# Patient Record
Sex: Male | Born: 1960
Health system: Southern US, Community
[De-identification: ages and names within clinical notes are randomized; demographics above are authoritative.]

## PROBLEM LIST (undated history)

## (undated) ENCOUNTER — Emergency Department (HOSPITAL_COMMUNITY): Payer: No Typology Code available for payment source | Source: Home / Self Care

## (undated) DIAGNOSIS — I2119 ST elevation (STEMI) myocardial infarction involving other coronary artery of inferior wall: Secondary | ICD-10-CM

## (undated) DIAGNOSIS — J449 Chronic obstructive pulmonary disease, unspecified: Secondary | ICD-10-CM

## (undated) DIAGNOSIS — J439 Emphysema, unspecified: Secondary | ICD-10-CM

## (undated) DIAGNOSIS — Z9861 Coronary angioplasty status: Secondary | ICD-10-CM

## (undated) DIAGNOSIS — I2510 Atherosclerotic heart disease of native coronary artery without angina pectoris: Secondary | ICD-10-CM

## (undated) HISTORY — PX: INGUINAL HERNIA REPAIR: SHX194

## (undated) HISTORY — DX: Chronic obstructive pulmonary disease, unspecified: J44.9

## (undated) HISTORY — PX: PERCUTANEOUS CORONARY STENT INTERVENTION (PCI-S): SHX6016

## (undated) HISTORY — DX: Emphysema, unspecified: J43.9

---

## 1985-09-02 HISTORY — PX: OTHER SURGICAL HISTORY: SHX169

## 2005-05-06 ENCOUNTER — Emergency Department (HOSPITAL_COMMUNITY): Admission: EM | Admit: 2005-05-06 | Discharge: 2005-05-06 | Payer: Self-pay | Admitting: Family Medicine

## 2005-05-08 ENCOUNTER — Emergency Department (HOSPITAL_COMMUNITY): Admission: EM | Admit: 2005-05-08 | Discharge: 2005-05-08 | Payer: Self-pay | Admitting: Family Medicine

## 2005-08-30 ENCOUNTER — Emergency Department (HOSPITAL_COMMUNITY): Admission: EM | Admit: 2005-08-30 | Discharge: 2005-08-30 | Payer: Self-pay | Admitting: Family Medicine

## 2005-10-23 ENCOUNTER — Ambulatory Visit: Payer: Self-pay | Admitting: Internal Medicine

## 2006-03-03 ENCOUNTER — Encounter: Admission: RE | Admit: 2006-03-03 | Discharge: 2006-03-03 | Payer: Self-pay | Admitting: Orthopaedic Surgery

## 2007-07-15 ENCOUNTER — Telehealth (INDEPENDENT_AMBULATORY_CARE_PROVIDER_SITE_OTHER): Payer: Self-pay | Admitting: *Deleted

## 2007-07-16 ENCOUNTER — Ambulatory Visit: Payer: Self-pay | Admitting: Internal Medicine

## 2007-07-17 LAB — CONVERTED CEMR LAB
Albumin: 4.5 g/dL (ref 3.5–5.2)
BUN: 3 mg/dL — ABNORMAL LOW (ref 6–23)
CO2: 32 meq/L (ref 19–32)
Calcium: 9.6 mg/dL (ref 8.4–10.5)
Chloride: 99 meq/L (ref 96–112)
Creatinine, Ser: 0.8 mg/dL (ref 0.4–1.5)
GFR calc Af Amer: 134 mL/min
GFR calc non Af Amer: 111 mL/min
Glucose, Bld: 108 mg/dL — ABNORMAL HIGH (ref 70–99)
Phosphorus: 3.4 mg/dL (ref 2.3–4.6)
Potassium: 3.6 meq/L (ref 3.5–5.1)
Sodium: 137 meq/L (ref 135–145)
Uric Acid, Serum: 6.7 mg/dL (ref 2.4–7.0)

## 2007-09-03 HISTORY — PX: OTHER SURGICAL HISTORY: SHX169

## 2012-06-22 ENCOUNTER — Encounter: Payer: Self-pay | Admitting: Gastroenterology

## 2012-06-22 ENCOUNTER — Ambulatory Visit (INDEPENDENT_AMBULATORY_CARE_PROVIDER_SITE_OTHER)
Admission: RE | Admit: 2012-06-22 | Discharge: 2012-06-22 | Disposition: A | Discharge status: Home / Self Care | Payer: 59 | Source: Ambulatory Visit | Attending: Internal Medicine | Admitting: Internal Medicine

## 2012-06-22 ENCOUNTER — Encounter: Payer: Self-pay | Admitting: Internal Medicine

## 2012-06-22 ENCOUNTER — Other Ambulatory Visit: Payer: Self-pay

## 2012-06-22 ENCOUNTER — Ambulatory Visit (INDEPENDENT_AMBULATORY_CARE_PROVIDER_SITE_OTHER): Payer: 59 | Admitting: Internal Medicine

## 2012-06-22 VITALS — BP 138/80 | HR 74 | Temp 98.0°F | Ht 69.0 in | Wt 143.0 lb

## 2012-06-22 DIAGNOSIS — R059 Cough, unspecified: Secondary | ICD-10-CM | POA: Insufficient documentation

## 2012-06-22 DIAGNOSIS — J449 Chronic obstructive pulmonary disease, unspecified: Secondary | ICD-10-CM

## 2012-06-22 DIAGNOSIS — R05 Cough: Secondary | ICD-10-CM

## 2012-06-22 DIAGNOSIS — Z0000 Encounter for general adult medical examination without abnormal findings: Secondary | ICD-10-CM | POA: Insufficient documentation

## 2012-06-22 DIAGNOSIS — Z1211 Encounter for screening for malignant neoplasm of colon: Secondary | ICD-10-CM

## 2012-06-22 NOTE — Progress Notes (Signed)
Subjective:    Patient ID: Ryan Cantrell, male    DOB: Jun 30, 1961, 52 y.o.   MRN: [id number]  HPI Here for 1st time in about 5 years Had urgent care visit for chest tightness at night---given albuterol inhaler and he takes that at night Has decreased to 1/3rd pack per day plus electronic cigarette sometimes  Reviewed status Did break left shoulder 2009--doing okay from this  Some sleep problems with the breathing ---diagnosed also at urgent care Wife has noted occ gasping in past--seems to be better with the albuterol Not much cough in day No exercise of note-- no real change in activity tolerance  Current Outpatient Prescriptions on File Prior to Visit  Medication Sig Dispense Refill  . PROAIR HFA 108 (90 BASE) MCG/ACT inhaler Inhale 1-2 puffs into the lungs as needed.         Allergies  Allergen Reactions  . Codeine Sulfate     No past medical history on file.  Past Surgical History  Procedure Date  . Inguinal hernia repair     twice on right and once on left-- 1980's-90's  . Right shoulder surgery 1987  . Fracture left humerus 2009    no surgery. Torn rotator cuff also    Family History  Problem Relation Age of Onset  . Hypertension Mother   . Diabetes Mother   . Heart disease Mother     stent  . Cancer Father     prostate  . Hypertension Father   . Gout Father   . Heart disease Father     Stent    History   Social History  . Marital Status: Married    Spouse Name: N/A    Number of Children: 1  . Years of Education: N/A   Occupational History  . Control and instrumentation engineer    Social History Main Topics  . Smoking status: Current Every Day Smoker    Types: Cigarettes  . Smokeless tobacco: Never Used  . Alcohol Use: No  . Drug Use: No  . Sexually Active: Not on file   Other Topics Concern  . Not on file   Social History Narrative  . No narrative on file   Review of Systems  Constitutional: Negative for fatigue and unexpected weight change.   Weight down mildly from 5 years ago  HENT: Positive for hearing loss and dental problem. Negative for congestion, rhinorrhea and tinnitus.        Teeth are "awful" Thinks he will need to have them all pulled  Eyes: Negative for visual disturbance.       No diplopia or unilateral vision loss More trouble with night vision and fine work  Respiratory: Positive for cough and chest tightness. Negative for shortness of breath.   Cardiovascular: Negative for chest pain, palpitations and leg swelling.  Gastrointestinal: Negative for nausea, vomiting, abdominal pain, constipation and blood in stool.       No heartburn   Genitourinary: Positive for urgency. Negative for frequency and difficulty urinating.       No sexual problems  Musculoskeletal: Negative for back pain and joint swelling.       Left thumb pain  Skin: Positive for rash.       Some eczema --mostly on feet Uses OTC cream  Neurological: Positive for headaches. Negative for dizziness, syncope, weakness, light-headedness and numbness.       AM headaches---?wondering about sleep apnea Drinks a lot of caffeinated Dr Lane Hacker this  Hematological: Positive for adenopathy. Does  not bruise/bleed easily.       Did have some glands in his neck---not too noticeable now  Psychiatric/Behavioral: Positive for disturbed wake/sleep cycle. Negative for dysphoric mood. The patient is not nervous/anxious.        Irritable with cutting down on cigarettes       Objective:   Physical Exam  Constitutional: He is oriented to person, place, and time. He appears well-developed and well-nourished. No distress.  HENT:  Head: Normocephalic and atraumatic.  Right Ear: External ear normal.  Left Ear: External ear normal.  Mouth/Throat: Oropharynx is clear and moist. No oropharyngeal exudate.       Poor dentition  Eyes: Conjunctivae normal and EOM are normal. Pupils are equal, round, and reactive to light.  Neck: Normal range of motion. Neck  supple. No thyromegaly present.  Cardiovascular: Normal rate, regular rhythm, normal heart sounds and intact distal pulses.  Exam reveals no gallop.   No murmur heard. Pulmonary/Chest: Effort normal. No respiratory distress. He has no wheezes. He has no rales.       Mildly decreased breath sounds at bases  Abdominal: Soft. There is no tenderness.  Musculoskeletal: He exhibits no edema and no tenderness.  Lymphadenopathy:    He has no cervical adenopathy.  Neurological: He is alert and oriented to person, place, and time.  Skin: Rash noted. No erythema.       Scaling of apparent dyshydrotic eczema on feet  Psychiatric: He has a normal mood and affect. His behavior is normal.          Assessment & Plan:

## 2012-06-22 NOTE — Assessment & Plan Note (Signed)
He is mildly symptomatic with his night cough and breathing issues Albuterol has helped Must stop smoking--discussed this and instructions given

## 2012-06-22 NOTE — Patient Instructions (Signed)
Please start nicotine patch 21mg  daily and use for at least 2-3 months. You can also use nicotine gum or lozenges when you feel a craving so that you never smoke a cigarette again

## 2012-06-22 NOTE — Addendum Note (Signed)
Addended by: Alvina Chou on: 06/22/2012 03:25 PM   Modules accepted: Orders

## 2012-06-22 NOTE — Assessment & Plan Note (Signed)
Reasonably healthy No exercise--discussed Will check PSA since dad had prostate cancer Will set up colonoscopy

## 2012-06-23 LAB — HEPATIC FUNCTION PANEL
Albumin: 4.2 g/dL (ref 3.5–5.2)
Bilirubin, Direct: 0.2 mg/dL (ref 0.0–0.3)
Total Protein: 7.4 g/dL (ref 6.0–8.3)

## 2012-06-23 LAB — BASIC METABOLIC PANEL
BUN: 8 mg/dL (ref 6–23)
CO2: 30 mEq/L (ref 19–32)
Calcium: 9.4 mg/dL (ref 8.4–10.5)
Creatinine, Ser: 0.8 mg/dL (ref 0.4–1.5)
Glucose, Bld: 81 mg/dL (ref 70–99)
Sodium: 132 mEq/L — ABNORMAL LOW (ref 135–145)

## 2012-06-23 LAB — CBC WITH DIFFERENTIAL/PLATELET
Basophils Absolute: 0 10*3/uL (ref 0.0–0.1)
Eosinophils Absolute: 0.4 10*3/uL (ref 0.0–0.7)
Hemoglobin: 13.8 g/dL (ref 13.0–17.0)
Lymphocytes Relative: 18.6 % (ref 12.0–46.0)
MCHC: 33.1 g/dL (ref 30.0–36.0)
Neutro Abs: 5 10*3/uL (ref 1.4–7.7)
Platelets: 347 10*3/uL (ref 150.0–400.0)
RDW: 13.3 % (ref 11.5–14.6)

## 2012-06-23 LAB — LIPID PANEL
Cholesterol: 136 mg/dL (ref 0–200)
HDL: 30.7 mg/dL — ABNORMAL LOW (ref >39.00)
Triglycerides: 135 mg/dL (ref 0.0–149.0)
VLDL: 27 mg/dL (ref 0.0–40.0)

## 2012-06-25 LAB — TSH: TSH: 0.72 u[IU]/mL (ref 0.35–5.50)

## 2012-06-29 ENCOUNTER — Encounter: Payer: Self-pay | Admitting: *Deleted

## 2012-07-02 ENCOUNTER — Telehealth: Payer: Self-pay

## 2012-07-02 NOTE — Telephone Encounter (Signed)
Pt returned call about lab and xray results(cannot note on result notes) CMA has already mailed letter with lab results and had spoken with pts wife for xray results. Reviewed lab and xray results with pt as instructed.pt voiced understanding.

## 2012-08-03 ENCOUNTER — Ambulatory Visit (AMBULATORY_SURGERY_CENTER): Payer: 59 | Admitting: *Deleted

## 2012-08-03 VITALS — Ht 71.0 in | Wt 155.4 lb

## 2012-08-03 DIAGNOSIS — Z1211 Encounter for screening for malignant neoplasm of colon: Secondary | ICD-10-CM

## 2012-08-03 MED ORDER — MOVIPREP 100 G PO SOLR: ORAL | Status: DC

## 2012-08-17 ENCOUNTER — Ambulatory Visit (AMBULATORY_SURGERY_CENTER): Payer: 59 | Admitting: Gastroenterology

## 2012-08-17 ENCOUNTER — Encounter: Payer: Self-pay | Admitting: Gastroenterology

## 2012-08-17 VITALS — BP 116/63 | HR 71 | Resp 14 | Ht 71.0 in | Wt 155.0 lb

## 2012-08-17 DIAGNOSIS — K5730 Diverticulosis of large intestine without perforation or abscess without bleeding: Secondary | ICD-10-CM

## 2012-08-17 DIAGNOSIS — Z1211 Encounter for screening for malignant neoplasm of colon: Secondary | ICD-10-CM

## 2012-08-17 DIAGNOSIS — K644 Residual hemorrhoidal skin tags: Secondary | ICD-10-CM

## 2012-08-17 MED ORDER — SODIUM CHLORIDE 0.9 % IV SOLN: 500.0000 mL | INTRAVENOUS | Status: DC

## 2012-08-17 NOTE — Op Note (Signed)
[facility] [address] [place], [address]   COLONOSCOPY PROCEDURE REPORT  PATIENT: Ryan Cantrell, Ryan Cantrell  MR#: [id number] BIRTHDATE: 01/07/1961 , 51  yrs. old GENDER: Male ENDOSCOPIST: Rachael Fee, MD REFERRED NU:[id number] Alphonsus Sias, M.D. PROCEDURE DATE:  08/17/2012 PROCEDURE:   Colonoscopy, diagnostic ASA CLASS:   Class II INDICATIONS:average risk screening. MEDICATIONS: Fentanyl 50 mcg IV, Versed 6 mg IV, and These medications were titrated to patient response per physician's verbal order  DESCRIPTION OF PROCEDURE:   After the risks benefits and alternatives of the procedure were thoroughly explained, informed consent was obtained.  A digital rectal exam revealed no abnormalities of the rectum.   The LB CF-[id number]  endoscope was introduced through the anus and advanced to the cecum, which was identified by both the appendix and ileocecal valve. No adverse events experienced.   The quality of the prep was good, using MoviPrep  The instrument was then slowly withdrawn as the colon was fully examined.  COLON FINDINGS: There were a few small diverticulum in left colon. There were medium sized external hemorrhoids.  The examination was otherwise normal.  Retroflexed views revealed no abnormalities. The time to cecum=3 minutes 15 seconds.  Withdrawal time=8 minutes 40 seconds.  The scope was withdrawn and the procedure completed. COMPLICATIONS: There were no complications.  ENDOSCOPIC IMPRESSION: There were a few small diverticulum in left colon. There were medium sized external hemorrhoids. The examination was otherwise normal. No polyps or cancers.  RECOMMENDATIONS: You should continue to follow colorectal cancer screening guidelines for "routine risk" patients with a repeat colonoscopy in 10 years. There is no need for FOBT (stool) testing for at least 5 years.   eSigned:  Rachael Fee, MD 08/17/2012 10:38 AM

## 2012-08-17 NOTE — Progress Notes (Signed)
Patient did not experience any of the following events: a burn prior to discharge; a fall within the facility; wrong site/side/patient/procedure/implant event; or a hospital transfer or hospital admission upon discharge from the facility. (G8907) Patient did not have preoperative order for IV antibiotic SSI prophylaxis. (G8918)  

## 2012-08-17 NOTE — Patient Instructions (Addendum)
YOU HAD AN ENDOSCOPIC PROCEDURE TODAY AT THE [facility]: Refer to the procedure report that was given to you for any specific questions about what was found during the examination.  If the procedure report does not answer your questions, please call your gastroenterologist to clarify.  If you requested that your care partner not be given the details of your procedure findings, then the procedure report has been included in a sealed envelope for you to review at your convenience later.  YOU SHOULD EXPECT: Some feelings of bloating in the abdomen. Passage of more gas than usual.  Walking can help get rid of the air that was put into your GI tract during the procedure and reduce the bloating. If you had a lower endoscopy (such as a colonoscopy or flexible sigmoidoscopy) you may notice spotting of blood in your stool or on the toilet paper. If you underwent a bowel prep for your procedure, then you may not have a normal bowel movement for a few days.  DIET: Your first meal following the procedure should be a light meal and then it is ok to progress to your normal diet.  A half-sandwich or bowl of soup is an example of a good first meal.  Heavy or fried foods are harder to digest and may make you feel nauseous or bloated.  Likewise meals heavy in dairy and vegetables can cause extra gas to form and this can also increase the bloating.  Drink plenty of fluids but you should avoid alcoholic beverages for 24 hours.  ACTIVITY: Your care partner should take you home directly after the procedure.  You should plan to take it easy, moving slowly for the rest of the day.  You can resume normal activity the day after the procedure however you should NOT DRIVE or use heavy machinery for 24 hours (because of the sedation medicines used during the test).    SYMPTOMS TO REPORT IMMEDIATELY: A gastroenterologist can be reached at any hour.  During normal business hours, 8:30 AM to 5:00 PM Monday through Friday,  call [phone].  After hours and on weekends, please call the GI answering service at [phone] who will take a message and have the physician on call contact you.   Following lower endoscopy (colonoscopy or flexible sigmoidoscopy):  Excessive amounts of blood in the stool  Significant tenderness or worsening of abdominal pains  Swelling of the abdomen that is new, acute  Fever of 100F or higher    FOLLOW UP: If any biopsies were taken you will be contacted by phone or by letter within the next 1-3 weeks.  Call your gastroenterologist if you have not heard about the biopsies in 3 weeks.  Our staff will call the home number listed on your records the next business day following your procedure to check on you and address any questions or concerns that you may have at that time regarding the information given to you following your procedure. This is a courtesy call and so if there is no answer at the home number and we have not heard from you through the emergency physician on call, we will assume that you have returned to your regular daily activities without incident.  SIGNATURES/CONFIDENTIALITY: You and/or your care partner have signed paperwork which will be entered into your electronic medical record.  These signatures attest to the fact that that the information above on your After Visit Summary has been reviewed and is understood.  Full responsibility of the confidentiality   of this discharge information lies with you and/or your care-partner.     

## 2012-08-18 ENCOUNTER — Telehealth: Payer: Self-pay | Admitting: *Deleted

## 2012-08-18 NOTE — Telephone Encounter (Signed)
  Follow up Call-  Call back number 08/17/2012  Post procedure Call Back phone  # [phone]  Permission to leave phone message Yes    [facility]

## 2012-08-31 ENCOUNTER — Encounter: Payer: Self-pay | Admitting: Internal Medicine

## 2012-09-03 ENCOUNTER — Encounter: Payer: Self-pay | Admitting: Internal Medicine

## 2012-09-07 ENCOUNTER — Telehealth: Payer: Self-pay | Admitting: Internal Medicine

## 2012-09-07 MED ORDER — ALBUTEROL SULFATE HFA 108 (90 BASE) MCG/ACT IN AERS: 2.0000 | INHALATION_SPRAY | Freq: Three times a day (TID) | RESPIRATORY_TRACT | Status: DC | PRN

## 2012-09-07 NOTE — Telephone Encounter (Signed)
See My chart message

## 2012-09-22 ENCOUNTER — Ambulatory Visit: Payer: 59 | Admitting: Internal Medicine

## 2012-09-22 ENCOUNTER — Encounter: Payer: Self-pay | Admitting: Family Medicine

## 2012-09-22 ENCOUNTER — Ambulatory Visit (INDEPENDENT_AMBULATORY_CARE_PROVIDER_SITE_OTHER): Payer: 59 | Admitting: Family Medicine

## 2012-09-22 VITALS — BP 122/68 | HR 66 | Temp 98.2°F | Ht 71.0 in | Wt 151.8 lb

## 2012-09-22 DIAGNOSIS — R51 Headache: Secondary | ICD-10-CM

## 2012-09-22 DIAGNOSIS — R519 Headache, unspecified: Secondary | ICD-10-CM | POA: Insufficient documentation

## 2012-09-22 MED ORDER — CYCLOBENZAPRINE HCL 10 MG PO TABS: 10.0000 mg | ORAL_TABLET | Freq: Two times a day (BID) | ORAL | Status: DC | PRN

## 2012-09-22 NOTE — Progress Notes (Signed)
  Subjective:    Patient ID: Ryan Cantrell, male    DOB: 1961/05/25, 52 y.o.   MRN: [id number]  HPI CC: headache  Tends to get headaches in morning (3/10 pain), attributed to caffeine.  This morning had "vise-like" bilateral headache - described as tight pain both temples - lasted about 1 hour.  Did not wake him up from sleep.  Rates at 7/10 pain.  Improved with tylenol.  Mild residual headache.  Stayed home from work 2/2 headache.  No photo/phonophobia, nausea, vision changes, dizziness, vertigo.  Comes in for evaluation of this new headache this morning.  + neck pain, attributes to work.  Works at [employer], lots of repetitive head motion back and forth.  More stress at work recently - big projects.  Caffeine - four to five 12 oz dr pepper per day.   No changes to this. Sleeps 7 hours/night.  Sleeps same amt on weekends. Smoking - about 5-8 cig/day.  Using patches and e-cig.  No h/o allergic rhinitis. Uses reading glasses at work.  Notes some trouble with fine print.  Last saw eye doctor several years ago.  States he usually takes 1000-2000mg  tylenol daily for headaches.  Feeling mildly more tired recently.  Past Medical History  Diagnosis Date  . COPD (chronic obstructive pulmonary disease)   . Emphysema     Review of Systems Per HPI    Objective:   Physical Exam  Nursing note and vitals reviewed. Constitutional: He appears well-developed and well-nourished. No distress.  HENT:  Head: Normocephalic and atraumatic.  Right Ear: Hearing, tympanic membrane, external ear and ear canal normal.  Left Ear: Hearing, tympanic membrane, external ear and ear canal normal.  Nose: Nose normal. No mucosal edema or rhinorrhea. Right sinus exhibits no maxillary sinus tenderness and no frontal sinus tenderness. Left sinus exhibits no maxillary sinus tenderness and no frontal sinus tenderness.  Mouth/Throat: Uvula is midline, oropharynx is clear and moist and mucous membranes are normal. No  oropharyngeal exudate, posterior oropharyngeal edema, posterior oropharyngeal erythema or tonsillar abscesses.       No nasal congestion Poor dentition  Eyes: Conjunctivae normal and EOM are normal. Pupils are equal, round, and reactive to light. No scleral icterus.  Neck: Normal range of motion. Neck supple.       FROM at neck. No pain to palpation midline cervical spine.  Cardiovascular: Normal rate, regular rhythm, normal heart sounds and intact distal pulses.   No murmur heard. Pulmonary/Chest: Effort normal and breath sounds normal. No respiratory distress. He has no wheezes. He has no rales.  Lymphadenopathy:    He has no cervical adenopathy.  Neurological: He is alert. He has normal strength. No cranial nerve deficit or sensory deficit. He displays a negative Romberg sign. Coordination and gait normal.       CN 2-12 intact Station and gait intact Neg pronator drift. Normal FTN, HTS. Some horizontal nystagmus noted with end lateral gaze bilaterally  Skin: Skin is warm and dry. No rash noted.       Assessment & Plan:

## 2012-09-22 NOTE — Assessment & Plan Note (Addendum)
In setting of significant caffeine use, likely caffeine related - recommend slowly try and titrate down amt caffeine. ?MOH - rec try to limit analgesic use, prescribed flexeril abortively. Less likely but possible TTH - provided with neck stretching exercises. Advised him to monitor HA for any triggers. Forgot to check snellen today.

## 2012-09-22 NOTE — Patient Instructions (Addendum)
I wonder if this is a bit of tension type headache from repetitive motions at work (stress can also contribute). Continue to use tylenol as needed during day. If at night or if you're at home, may try muscle relaxant to help headache. If worsening despite this, let us know. Stretching exercises provided. Work on cutting back on caffeine slowly. Try to back off of tylenol (ideally no more than 2-3 times per week).

## 2012-09-24 ENCOUNTER — Encounter: Payer: Self-pay | Admitting: Internal Medicine

## 2012-09-24 ENCOUNTER — Ambulatory Visit: Payer: 59 | Admitting: Internal Medicine

## 2012-09-24 ENCOUNTER — Ambulatory Visit (INDEPENDENT_AMBULATORY_CARE_PROVIDER_SITE_OTHER): Payer: 59 | Admitting: Internal Medicine

## 2012-09-24 VITALS — BP 138/70 | HR 75 | Temp 98.0°F | Wt 150.0 lb

## 2012-09-24 DIAGNOSIS — J449 Chronic obstructive pulmonary disease, unspecified: Secondary | ICD-10-CM

## 2012-09-24 DIAGNOSIS — F17200 Nicotine dependence, unspecified, uncomplicated: Secondary | ICD-10-CM

## 2012-09-24 DIAGNOSIS — F17210 Nicotine dependence, cigarettes, uncomplicated: Secondary | ICD-10-CM

## 2012-09-24 DIAGNOSIS — Z87891 Personal history of nicotine dependence: Secondary | ICD-10-CM | POA: Insufficient documentation

## 2012-09-24 NOTE — Patient Instructions (Signed)
Please stop the smoking entirely. Use a nicotine lozenge if you feel the urge to smoke Call the 1-800 -QUIT NOW hotline

## 2012-09-24 NOTE — Progress Notes (Signed)
  Subjective:    Patient ID: Ryan Cantrell, male    DOB: 1961/07/18, 52 y.o.   MRN: [id number]  HPI Wife is here Headache is better He believes there is some degree of neck strain Discussed cutting back on the caffeine also  Breathing is better No longer awakening at night No longer gasping Still uses the albuterol every night at bedtime No wheezing or tightness Snoring is better with decreased cigarettes  Still smoking but has cut back Now about 6 per day Hard around the holidays when he is home more Recognizes that oral fixation is a big issue as well Did try the patches for a while but never gave up the cigarettes   Current Outpatient Prescriptions on File Prior to Visit  Medication Sig Dispense Refill  . Acetaminophen (TYLENOL PO) Take by mouth as needed.      Marland Kitchen albuterol (PROAIR HFA) 108 (90 BASE) MCG/ACT inhaler Inhale 2 puffs into the lungs 3 (three) times daily as needed.  18 g  1  . cyclobenzaprine (FLEXERIL) 10 MG tablet Take 1 tablet (10 mg total) by mouth 2 (two) times daily as needed (headache (sedation precautions)).  30 tablet  0  . Multiple Vitamin (MULTIVITAMIN) tablet Take 1 tablet by mouth daily.        Allergies  Allergen Reactions  . Codeine Sulfate Nausea And Vomiting    Past Medical History  Diagnosis Date  . COPD (chronic obstructive pulmonary disease)   . Emphysema     Past Surgical History  Procedure Date  . Inguinal hernia repair     twice on right and once on left-- 1980's-90's  . Right shoulder surgery 1987  . Fracture left humerus 2009    no surgery. Torn rotator cuff also    Family History  Problem Relation Age of Onset  . Hypertension Mother   . Diabetes Mother   . Heart disease Mother     stent  . Cancer Father     prostate  . Hypertension Father   . Gout Father   . Heart disease Father     Stent  . Colon cancer Neg Hx   . Stomach cancer Neg Hx     History   Social History  . Marital Status: Married    Spouse Name:  N/A    Number of Children: 1  . Years of Education: N/A   Occupational History  . Control and instrumentation engineer    Social History Main Topics  . Smoking status: Light Tobacco Smoker    Types: Cigarettes  . Smokeless tobacco: Never Used     Comment: smokes 3 cigarettes daily  . Alcohol Use: No  . Drug Use: No  . Sexually Active: Not on file   Other Topics Concern  . Not on file   Social History Narrative  . No narrative on file   Review of Systems Not using spacer--discussed Eating okay--better with decreased cigarettes     Objective:   Physical Exam  Constitutional: He appears well-developed and well-nourished. No distress.  Pulmonary/Chest: Effort normal and breath sounds normal. No respiratory distress. He has no wheezes. He has no rales.          Assessment & Plan:

## 2012-09-24 NOTE — Assessment & Plan Note (Signed)
Counseled for 5-10 minutes Must stop Gave info on 1-800 QUIT NOW

## 2012-09-24 NOTE — Assessment & Plan Note (Signed)
Better with decreased smoking Uses the inhaler just at night

## 2012-12-01 ENCOUNTER — Encounter: Payer: Self-pay | Admitting: *Deleted

## 2012-12-01 ENCOUNTER — Ambulatory Visit (INDEPENDENT_AMBULATORY_CARE_PROVIDER_SITE_OTHER): Payer: 59 | Admitting: Family Medicine

## 2012-12-01 ENCOUNTER — Encounter: Payer: Self-pay | Admitting: Family Medicine

## 2012-12-01 VITALS — BP 126/82 | HR 71 | Temp 98.2°F | Wt 151.2 lb

## 2012-12-01 DIAGNOSIS — R059 Cough, unspecified: Secondary | ICD-10-CM | POA: Insufficient documentation

## 2012-12-01 DIAGNOSIS — R05 Cough: Secondary | ICD-10-CM

## 2012-12-01 MED ORDER — BENZONATATE 200 MG PO CAPS: 200.0000 mg | ORAL_CAPSULE | Freq: Three times a day (TID) | ORAL | Status: DC | PRN

## 2012-12-01 MED ORDER — DOXYCYCLINE HYCLATE 100 MG PO TABS: 100.0000 mg | ORAL_TABLET | Freq: Two times a day (BID) | ORAL | Status: DC

## 2012-12-01 NOTE — Progress Notes (Signed)
Sx started about 8 days ago.  Cough and change in taste.  +sputum, worse at night.  Used his SABA w/o much obvious relief.  He has a lot of postnasal gtt.  No wheeze per patient.  Not sob.  Chest is sore from cough. No ear pain now but felt a little stuffy prev. HA noted. No fevers known.  Mild occ ST likely from cough.  D/w pt about smoking, he's considering quitting.   Had a flu shot at work.   Meds, vitals, and allergies reviewed.   ROS: See HPI.  Otherwise, noncontributory.  GEN: nad, alert and oriented HEENT: mucous membranes moist, tm w/o erythema, nasal exam w/o erythema, clear discharge noted,  OP with cobblestoning NECK: supple w/o LA CV: rrr.   PULM: ctab, no inc wob, no wheeze EXT: no edema SKIN: no acute rash

## 2012-12-01 NOTE — Assessment & Plan Note (Signed)
Would treat with doxy given his hx and his recent sx >1 week.  Nontoxic.  Use tessalon for cough.  D/w pt about smoking.  F/u prn.  He agrees.

## 2012-12-01 NOTE — Patient Instructions (Signed)
Take the tessalon for cough and start the antibiotics today.   This should gradually improve.  Take care.  Glad to see you.

## 2013-01-25 ENCOUNTER — Other Ambulatory Visit: Payer: Self-pay | Admitting: Internal Medicine

## 2013-04-05 ENCOUNTER — Other Ambulatory Visit: Payer: Self-pay | Admitting: Internal Medicine

## 2013-04-17 ENCOUNTER — Other Ambulatory Visit: Payer: Self-pay | Admitting: Internal Medicine

## 2013-06-04 ENCOUNTER — Ambulatory Visit (INDEPENDENT_AMBULATORY_CARE_PROVIDER_SITE_OTHER): Payer: No Typology Code available for payment source | Admitting: Internal Medicine

## 2013-06-04 ENCOUNTER — Encounter: Payer: Self-pay | Admitting: Internal Medicine

## 2013-06-04 VITALS — BP 140/80 | HR 63 | Temp 97.9°F | Wt 141.0 lb

## 2013-06-04 DIAGNOSIS — F17210 Nicotine dependence, cigarettes, uncomplicated: Secondary | ICD-10-CM

## 2013-06-04 DIAGNOSIS — J209 Acute bronchitis, unspecified: Secondary | ICD-10-CM

## 2013-06-04 DIAGNOSIS — J449 Chronic obstructive pulmonary disease, unspecified: Secondary | ICD-10-CM

## 2013-06-04 DIAGNOSIS — F17200 Nicotine dependence, unspecified, uncomplicated: Secondary | ICD-10-CM

## 2013-06-04 MED ORDER — VARENICLINE TARTRATE 0.5 MG X 11 & 1 MG X 42 PO MISC: ORAL | Status: DC

## 2013-06-04 MED ORDER — VARENICLINE TARTRATE 1 MG PO TABS: 1.0000 mg | ORAL_TABLET | Freq: Two times a day (BID) | ORAL | Status: DC

## 2013-06-04 MED ORDER — DOXYCYCLINE MONOHYDRATE 100 MG PO TABS: 100.0000 mg | ORAL_TABLET | Freq: Two times a day (BID) | ORAL | Status: DC

## 2013-06-04 NOTE — Assessment & Plan Note (Signed)
Not exacerbated 

## 2013-06-04 NOTE — Patient Instructions (Addendum)
Call [phone] QUIT NOW

## 2013-06-04 NOTE — Assessment & Plan Note (Signed)
Unclear if still viral  Given lung issues--- will start antibiotic

## 2013-06-04 NOTE — Assessment & Plan Note (Signed)
Discussed chantix Will try this No history of depression, etc Discussed side effects and treatment duration

## 2013-06-04 NOTE — Progress Notes (Signed)
  Subjective:    Patient ID: Ryan Cantrell, male    DOB: 07-Apr-1961, 52 y.o.   MRN: [id number]  HPI Has been coughing---not sure if it is from drainage Worse at night--kept him from sleeping well Bush hogged 6 acres last week---seemed to start then Still smoking-but back up 3/4 PPD  No fever Mild SOB at night due to coughing Some wheezing per wife --at night Still uses inhaler at bedtime---rare extra doses  Scratchy throat No ear pain  Tried some "airborne"  Current Outpatient Prescriptions on File Prior to Visit  Medication Sig Dispense Refill  . Multiple Vitamin (MULTIVITAMIN) tablet Take 1 tablet by mouth daily.      . VENTOLIN HFA 108 (90 BASE) MCG/ACT inhaler INHALE 2 PUFFS BY MOUTH THREE TIMES DAILY AS NEEDED  18 g  0   No current facility-administered medications on file prior to visit.    Allergies  Allergen Reactions  . Codeine Sulfate Nausea And Vomiting    Past Medical History  Diagnosis Date  . COPD (chronic obstructive pulmonary disease)   . Emphysema     Past Surgical History  Procedure Laterality Date  . Inguinal hernia repair      twice on right and once on left-- 1980's-90's  . Right shoulder surgery  1987  . Fracture left humerus  2009    no surgery. Torn rotator cuff also    Family History  Problem Relation Age of Onset  . Hypertension Mother   . Diabetes Mother   . Heart disease Mother     stent  . Cancer Father     prostate  . Hypertension Father   . Gout Father   . Heart disease Father     Stent  . Colon cancer Neg Hx   . Stomach cancer Neg Hx     History   Social History  . Marital Status: Married    Spouse Name: N/A    Number of Children: 1  . Years of Education: N/A   Occupational History  . Control and instrumentation engineer    Social History Main Topics  . Smoking status: Light Tobacco Smoker    Types: Cigarettes  . Smokeless tobacco: Never Used     Comment: smokes 3 cigarettes daily  . Alcohol Use: No  . Drug Use: No  . Sexual  Activity: Not on file   Other Topics Concern  . Not on file   Social History Narrative  . No narrative on file   Review of Systems Slight rash on abdomen---resolving now. Relates to eczema No vomiting or diarrhea Appetite is okay     Objective:   Physical Exam  Constitutional: He appears well-developed and well-nourished. No distress.  HENT:  Mouth/Throat: Oropharynx is clear and moist. No oropharyngeal exudate.  No sinus tenderness TMs normal Moderate nasal inflammation  Neck: Normal range of motion. Neck supple. No thyromegaly present.  Pulmonary/Chest: Effort normal and breath sounds normal. No respiratory distress. He has no wheezes. He has no rales.  Lymphadenopathy:    He has no cervical adenopathy.          Assessment & Plan:

## 2013-06-13 ENCOUNTER — Other Ambulatory Visit: Payer: Self-pay | Admitting: Internal Medicine

## 2013-07-02 ENCOUNTER — Encounter: Payer: Self-pay | Admitting: Family Medicine

## 2013-07-02 ENCOUNTER — Ambulatory Visit (INDEPENDENT_AMBULATORY_CARE_PROVIDER_SITE_OTHER)
Admission: RE | Admit: 2013-07-02 | Discharge: 2013-07-02 | Disposition: A | Discharge status: Home / Self Care | Payer: No Typology Code available for payment source | Source: Ambulatory Visit | Attending: Family Medicine | Admitting: Family Medicine

## 2013-07-02 ENCOUNTER — Ambulatory Visit (INDEPENDENT_AMBULATORY_CARE_PROVIDER_SITE_OTHER): Payer: No Typology Code available for payment source | Admitting: Family Medicine

## 2013-07-02 ENCOUNTER — Telehealth: Payer: Self-pay | Admitting: Family Medicine

## 2013-07-02 VITALS — BP 140/70 | HR 70 | Temp 98.5°F | Ht 71.0 in | Wt 145.0 lb

## 2013-07-02 DIAGNOSIS — J449 Chronic obstructive pulmonary disease, unspecified: Secondary | ICD-10-CM

## 2013-07-02 DIAGNOSIS — R059 Cough, unspecified: Secondary | ICD-10-CM

## 2013-07-02 DIAGNOSIS — J4489 Other specified chronic obstructive pulmonary disease: Secondary | ICD-10-CM

## 2013-07-02 DIAGNOSIS — R053 Chronic cough: Secondary | ICD-10-CM | POA: Insufficient documentation

## 2013-07-02 DIAGNOSIS — R071 Chest pain on breathing: Secondary | ICD-10-CM

## 2013-07-02 DIAGNOSIS — R05 Cough: Secondary | ICD-10-CM

## 2013-07-02 DIAGNOSIS — R0789 Other chest pain: Secondary | ICD-10-CM | POA: Insufficient documentation

## 2013-07-02 MED ORDER — TIOTROPIUM BROMIDE MONOHYDRATE 18 MCG IN CAPS: 18.0000 ug | ORAL_CAPSULE | Freq: Every day | RESPIRATORY_TRACT | Status: DC

## 2013-07-02 NOTE — Patient Instructions (Signed)
We will call you with X-ray results.  

## 2013-07-02 NOTE — Progress Notes (Signed)
Subjective:    Patient ID: Ryan Cantrell, male    DOB: [birth year], 52 y.o.   MRN: [id number]  HPI 52 year old male pt of Dr. Alphonsus Sias with history of COPD presents with  pain in right chest wall.  Sharp shooting pain in right chest wall after sneezing yesterday. He had been sore in chest wall with recent nighttime coughing. Dry cough.  No fever. No chills.  No current sob, mild wheezing at night per wife. No ear pain, no face pain.  Using albuterol as needed at night.  Treated on 10/3  with antibiotic  ( doxycycline x 10) for persistent cough.  His symptoms improve but never went away... persistant nighttime cough.    Last CXR 2013.. Stable  Used airborne.   Hx smoker. PFTs 2013.. Mod severe COPD... Not on controller No unexpected weight loss, no night sweats. No family history of lung cancer, father with prostate cancer.    Review of Systems  Constitutional: Negative for fever and fatigue.  HENT: Negative for ear pain.   Eyes: Negative for pain.  Respiratory: Positive for cough. Negative for shortness of breath.   Cardiovascular: Positive for chest pain. Negative for leg swelling.  Gastrointestinal: Negative for abdominal pain.       Objective:   Physical Exam  Constitutional: Vital signs are normal. He appears well-developed and well-nourished.  Non-toxic appearance. He does not appear ill. No distress.  HENT:  Head: Normocephalic and atraumatic.  Right Ear: Hearing, tympanic membrane, external ear and ear canal normal. No tenderness. No foreign bodies. Tympanic membrane is not retracted and not bulging.  Left Ear: Hearing, tympanic membrane, external ear and ear canal normal. No tenderness. No foreign bodies. Tympanic membrane is not retracted and not bulging.  Nose: Nose normal. No mucosal edema or rhinorrhea. Right sinus exhibits no maxillary sinus tenderness and no frontal sinus tenderness. Left sinus exhibits no maxillary sinus tenderness and no frontal sinus  tenderness.  Mouth/Throat: Uvula is midline, oropharynx is clear and moist and mucous membranes are normal. Normal dentition. No dental caries. No oropharyngeal exudate or tonsillar abscesses.  Eyes: Conjunctivae, EOM and lids are normal. Pupils are equal, round, and reactive to light. Lids are everted and swept, no foreign bodies found.  Neck: Trachea normal, normal range of motion and phonation normal. Neck supple. Carotid bruit is not present. No mass and no thyromegaly present.  Cardiovascular: Normal rate, regular rhythm, S1 normal, S2 normal, normal heart sounds, intact distal pulses and normal pulses.  Exam reveals no gallop.   No murmur heard. Pulmonary/Chest: Effort normal and breath sounds normal. No respiratory distress. He has no decreased breath sounds. He has no wheezes. He has no rhonchi. He has no rales. Chest wall is not dull to percussion. He exhibits tenderness. He exhibits no mass and no bony tenderness.  No focal tenderness.... Diffuse ttp on right chest wall  Abdominal: Soft. Normal appearance and bowel sounds are normal. There is no hepatosplenomegaly. There is no tenderness. There is no rebound, no guarding and no CVA tenderness. No hernia.  Neurological: He is alert. He has normal reflexes.  Skin: Skin is warm, dry and intact. No rash noted.  Psychiatric: He has a normal mood and affect. His speech is normal and behavior is normal. Judgment normal.          Assessment & Plan:  Will eval with CXR to eval for rib fracture.. ? other cause of persistent cough and chest wall pain such as lung cancer  or smoldering infection. If CXR clear... persistant cough may be a funtion of heis mod severe COPD. Recommended smoking cessation and we may need to start spiriva or advair for symptoms.

## 2013-07-02 NOTE — Telephone Encounter (Signed)
Patient advised.  He will talk with his wife about starting Spiriva.  I told him it was already at the pharmacy if he decided to start it and that Dr. Ermalene Searing felt that it would help the cough and the SOB.

## 2013-07-02 NOTE — Telephone Encounter (Signed)
Notify pt there is no evidence of infection, lung mass etc. No clear sign of rib fracture. Likely chest wall muscle strain.  Cough persitant from poorly controlled COPD.. If he is agreeable i would like to start spiriva. I  willsnd in prescription. This should help with cough and SOB.

## 2013-07-08 ENCOUNTER — Other Ambulatory Visit: Payer: Self-pay

## 2013-09-24 ENCOUNTER — Encounter: Payer: Self-pay | Admitting: Internal Medicine

## 2013-09-24 ENCOUNTER — Ambulatory Visit (INDEPENDENT_AMBULATORY_CARE_PROVIDER_SITE_OTHER): Payer: No Typology Code available for payment source | Admitting: Internal Medicine

## 2013-09-24 ENCOUNTER — Encounter: Payer: 59 | Admitting: Internal Medicine

## 2013-09-24 VITALS — BP 128/70 | HR 71 | Temp 98.6°F | Ht 71.0 in | Wt 145.0 lb

## 2013-09-24 DIAGNOSIS — Z23 Encounter for immunization: Secondary | ICD-10-CM

## 2013-09-24 DIAGNOSIS — F17200 Nicotine dependence, unspecified, uncomplicated: Secondary | ICD-10-CM

## 2013-09-24 DIAGNOSIS — Z0000 Encounter for general adult medical examination without abnormal findings: Secondary | ICD-10-CM

## 2013-09-24 DIAGNOSIS — F17210 Nicotine dependence, cigarettes, uncomplicated: Secondary | ICD-10-CM

## 2013-09-24 DIAGNOSIS — J449 Chronic obstructive pulmonary disease, unspecified: Secondary | ICD-10-CM

## 2013-09-24 MED ORDER — TRIAMCINOLONE ACETONIDE 0.1 % EX CREA: 1.0000 "application " | TOPICAL_CREAM | Freq: Two times a day (BID) | CUTANEOUS | Status: DC | PRN

## 2013-09-24 NOTE — Assessment & Plan Note (Signed)
Better on the spiriva

## 2013-09-24 NOTE — Progress Notes (Signed)
Subjective:    Patient ID: Ryan Cantrell, male    DOB: [birth year], 53 y.o.   MRN: [id number]  HPI Here for physical Over the respiratory illnesses Afraid to use the chantix Discussed quitting  Libido is down No ED Discussed as marital issue  Current Outpatient Prescriptions on File Prior to Visit  Medication Sig Dispense Refill  . Multiple Vitamin (MULTIVITAMIN) tablet Take 1 tablet by mouth daily.      Marland Kitchen. PROAIR HFA 108 (90 BASE) MCG/ACT inhaler INHALE 2 PUFFS BY MOUTH THREE TIMES DAILY AS NEEDED  8.5 g  0  . tiotropium (SPIRIVA HANDIHALER) 18 MCG inhalation capsule Place 1 capsule (18 mcg total) into inhaler and inhale daily.  30 capsule  12   No current facility-administered medications on file prior to visit.    Allergies  Allergen Reactions  . Codeine Sulfate Nausea And Vomiting    Past Medical History  Diagnosis Date  . COPD (chronic obstructive pulmonary disease)   . Emphysema     Past Surgical History  Procedure Laterality Date  . Inguinal hernia repair      twice on right and once on left-- 1980's-90's  . Right shoulder surgery  1987  . Fracture left humerus  2009    no surgery. Torn rotator cuff also    Family History  Problem Relation Age of Onset  . Hypertension Mother   . Diabetes Mother   . Heart disease Mother     stent  . Cancer Father     prostate  . Hypertension Father   . Gout Father   . Heart disease Father     Stent  . Colon cancer Neg Hx   . Stomach cancer Neg Hx     History   Social History  . Marital Status: Married    Spouse Name: N/A    Number of Children: 1  . 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 Smoking status: Light Tobacco Smoker    Types: Cigarettes  . Smokeless tobacco: Never Used     Comment: smokes 1/2 PPD  . Alcohol Use: No  . Drug Use: No  . Sexual Activity: Not on file   Other Topics Concern  . Not on file   Social History Narrative  . No  narrative on file   Review of Systems  Constitutional: Negative for fatigue and unexpected weight change.       Wears seat belt  HENT: Positive for dental problem and hearing loss. Negative for tinnitus.        Bad teeth---overdue for dentist  Eyes: Negative for visual disturbance.       No diplopia or unilateral vision loss  Respiratory: Positive for cough. Negative for chest tightness and shortness of breath.        Slight cough Rarely uses rescue inhaler  Cardiovascular: Negative for chest pain, palpitations and leg swelling.  Gastrointestinal: Negative for nausea, vomiting, abdominal pain, constipation and blood in stool.       No heartburn  Endocrine: Negative for cold intolerance and heat intolerance.  Genitourinary: Negative for urgency, frequency and difficulty urinating.  Musculoskeletal: Positive for back pain. Negative for arthralgias and joint swelling.       AM back pain-- needs new mattress  Skin: Positive for rash.       eczema  Allergic/Immunologic: Negative for environmental allergies and immunocompromised state.  Neurological: Positive for headaches. Negative  for dizziness, syncope, weakness, light-headedness and numbness.       Caffeine withdrawal headaches  Hematological: Negative for adenopathy. Does not bruise/bleed easily.  Psychiatric/Behavioral: Negative for sleep disturbance and dysphoric mood. The patient is not nervous/anxious.        Objective:   Physical Exam  Constitutional: He is oriented to person, place, and time. He appears well-developed and well-nourished. No distress.  HENT:  Head: Normocephalic and atraumatic.  Right Ear: External ear normal.  Left Ear: External ear normal.  Mouth/Throat: Oropharynx is clear and moist. No oropharyngeal exudate.  Eyes: Conjunctivae and EOM are normal. Pupils are equal, round, and reactive to light.  Neck: Normal range of motion. Neck supple.  Cardiovascular: Normal rate, regular rhythm, normal heart sounds  and intact distal pulses.  Exam reveals no gallop.   No murmur heard. Pulmonary/Chest: Effort normal. No respiratory distress. He has no wheezes. He has no rales.  Slight decreased breath sounds in bases  Abdominal: Soft. Bowel sounds are normal. There is no tenderness.  Musculoskeletal: He exhibits no edema and no tenderness.  Lymphadenopathy:    He has no cervical adenopathy.  Neurological: He is alert and oriented to person, place, and time.  Skin: No erythema.  Scaling on left foot mostly  Psychiatric: He has a normal mood and affect. His behavior is normal.          Assessment & Plan:

## 2013-09-24 NOTE — Patient Instructions (Signed)
Please stop smoking completely---set a quit date. Call 1-800 QUIT NOW for help Use 21mg  nicotine patch daily for 2 months, then you can decrease to 14 and then 7. Don't smoke!!  If you have an urge to smoke, take a nicotine lozenge or gum instead.

## 2013-09-24 NOTE — Assessment & Plan Note (Addendum)
UTD on colon Will hold off on PSA till next year Defer any blood work till next year pneumovax

## 2013-09-24 NOTE — Progress Notes (Signed)
Pre-visit discussion using our clinic review tool. No additional management support is needed unless otherwise documented below in the visit note.  

## 2013-09-24 NOTE — Assessment & Plan Note (Signed)
Counseled about 5 minutes

## 2013-09-24 NOTE — Addendum Note (Signed)
Addended by: Sueanne MargaritaSMITH, DESHANNON L on: 09/24/2013 10:41 AM   Modules accepted: Orders

## 2013-09-28 ENCOUNTER — Telehealth: Payer: Self-pay | Admitting: Internal Medicine

## 2013-09-28 NOTE — Telephone Encounter (Signed)
Relevant patient education assigned to patient using Emmi. ° °

## 2013-10-29 ENCOUNTER — Encounter: Payer: Self-pay | Admitting: Internal Medicine

## 2013-10-29 ENCOUNTER — Ambulatory Visit (INDEPENDENT_AMBULATORY_CARE_PROVIDER_SITE_OTHER): Payer: No Typology Code available for payment source | Admitting: Internal Medicine

## 2013-10-29 VITALS — BP 136/84 | HR 65 | Temp 98.2°F | Wt 149.8 lb

## 2013-10-29 DIAGNOSIS — J309 Allergic rhinitis, unspecified: Secondary | ICD-10-CM

## 2013-10-29 MED ORDER — FLUTICASONE PROPIONATE 50 MCG/ACT NA SUSP: 2.0000 | Freq: Every day | NASAL | Status: DC

## 2013-10-29 NOTE — Patient Instructions (Addendum)
Allergic Rhinitis Allergic rhinitis is when the mucous membranes in the nose respond to allergens. Allergens are particles in the air that cause your body to have an allergic reaction. This causes you to release allergic antibodies. Through a chain of events, these eventually cause you to release histamine into the blood stream. Although meant to protect the body, it is this release of histamine that causes your discomfort, such as frequent sneezing, congestion, and an itchy, runny nose.  CAUSES  Seasonal allergic rhinitis (hay fever) is caused by pollen allergens that may come from grasses, trees, and weeds. Year-round allergic rhinitis (perennial allergic rhinitis) is caused by allergens such as house dust mites, pet dander, and mold spores.  SYMPTOMS   Nasal stuffiness (congestion).  Itchy, runny nose with sneezing and tearing of the eyes. DIAGNOSIS  Your health care provider can help you determine the allergen or allergens that trigger your symptoms. If you and your health care provider are unable to determine the allergen, skin or blood testing may be used. TREATMENT  Allergic Rhinitis does not have a cure, but it can be controlled by:  Medicines and allergy shots (immunotherapy).  Avoiding the allergen. Hay fever may often be treated with antihistamines in pill or nasal spray forms. Antihistamines block the effects of histamine. There are over-the-counter medicines that may help with nasal congestion and swelling around the eyes. Check with your health care provider before taking or giving this medicine.  If avoiding the allergen or the medicine prescribed do not work, there are many new medicines your health care provider can prescribe. Stronger medicine may be used if initial measures are ineffective. Desensitizing injections can be used if medicine and avoidance does not work. Desensitization is when a patient is given ongoing shots until the body becomes less sensitive to the allergen.  Make sure you follow up with your health care provider if problems continue. HOME CARE INSTRUCTIONS It is not possible to completely avoid allergens, but you can reduce your symptoms by taking steps to limit your exposure to them. It helps to know exactly what you are allergic to so that you can avoid your specific triggers. SEEK MEDICAL CARE IF:   You have a fever.  You develop a cough that does not stop easily (persistent).  You have shortness of breath.  You start wheezing.  Symptoms interfere with normal daily activities. Document Released: 05/14/2001 Document Revised: 06/09/2013 Document Reviewed: 04/26/2013 [employer] Patient Information 2014 [employer].  

## 2013-10-29 NOTE — Progress Notes (Signed)
Pre visit review using our clinic review tool, if applicable. No additional management support is needed unless otherwise documented below in the visit note. 

## 2013-10-29 NOTE — Progress Notes (Signed)
HPI  Pt presents to the clinic today sneezing, runny nose, headache, sinus pressure and pain. He reports this started 1 week ago. He has been blowing clear mucous out of his nose. She has taken OTC tylenol without much relief. He does have a history of COPD. He has not had sick contacts.  Review of Systems    Past Medical History  Diagnosis Date  . COPD (chronic obstructive pulmonary disease)   . Emphysema     Family History  Problem Relation Age of Onset  . Hypertension Mother   . Diabetes Mother   . Heart disease Mother     stent  . Cancer Father     prostate  . Hypertension Father   . Gout Father   . Heart disease Father     Stent  . Colon cancer Neg Hx   . Stomach cancer Neg Hx     History   Social History  . Marital Status: Married    Spouse Name: N/A    Number of Children: 1  . 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 Smoking status: Light Tobacco Smoker    Types: Cigarettes  . Smokeless tobacco: Never Used     Comment: smokes 1/2 PPD  . Alcohol Use: No  . Drug Use: No  . Sexual Activity: Not on file   Other Topics Concern  . Not on file   Social History Narrative  . No narrative on file    Allergies  Allergen Reactions  . Codeine Sulfate Nausea And Vomiting     Constitutional: Positive headache, fatiguer. Denies fever or abrupt weight changes.  HEENT:  Positive facial pain, nasal congestion. Denies eye redness, ear pain, ringing in the ears, wax buildup, runny nose or bloody nose. Respiratory:  Denies difficulty breathing or shortness of breath.  Cardiovascular: Denies chest pain, chest tightness, palpitations or swelling in the hands or feet.   No other specific complaints in a complete review of systems (except as listed in HPI above).  Objective:  BP 136/84  Pulse 65  Temp(Src) 98.2 F (36.8 C) (Oral)  Wt 149 lb 12 oz (67.926 kg)  SpO2 99%   General: Appears his stated age,  well developed, well nourished in NAD. HEENT: Head: normal shape and size, sinus tenderness noted; Eyes: sclera white, no icterus, conjunctiva pink, PERRLA and EOMs intact; Ears: Tm's gray and intact, normal light reflex; Nose: mucosa pink and moist, septum midline; Throat/Mouth: + PND. Teeth present, mucosa pink and moist, no exudate noted, no lesions or ulcerations noted.  Neck: Neck supple, trachea midline. No massses, lumps or thyromegaly present.  Cardiovascular: Normal rate and rhythm. S1,S2 noted.  No murmur, rubs or gallops noted. No JVD or BLE edema. No carotid bruits noted. Pulmonary/Chest: Normal effort and positive vesicular breath sounds. No respiratory distress. No wheezes, rales or ronchi noted.      Assessment & Plan:   Allergic Rhinitis:  Take Zyrtec OTC daily Erx for Flonase daily x 3 days then thereafter as needed  RTC as needed or if symptoms persist.

## 2013-11-01 ENCOUNTER — Telehealth: Payer: Self-pay | Admitting: Internal Medicine

## 2013-11-01 NOTE — Telephone Encounter (Signed)
Relevant patient education assigned to patient using Emmi. ° °

## 2013-11-16 ENCOUNTER — Other Ambulatory Visit: Payer: Self-pay | Admitting: Internal Medicine

## 2013-11-16 MED ORDER — ALBUTEROL SULFATE HFA 108 (90 BASE) MCG/ACT IN AERS: 2.0000 | INHALATION_SPRAY | Freq: Three times a day (TID) | RESPIRATORY_TRACT | Status: DC | PRN

## 2013-12-18 IMAGING — CR DG CHEST 2V
3 series · 3 of 3 positions shown · non-contrast
Comparison: No priors.

CLINICAL DATA: Cough.  COPD.

CHEST - 2 VIEW

[view not recorded (1 of 3)]
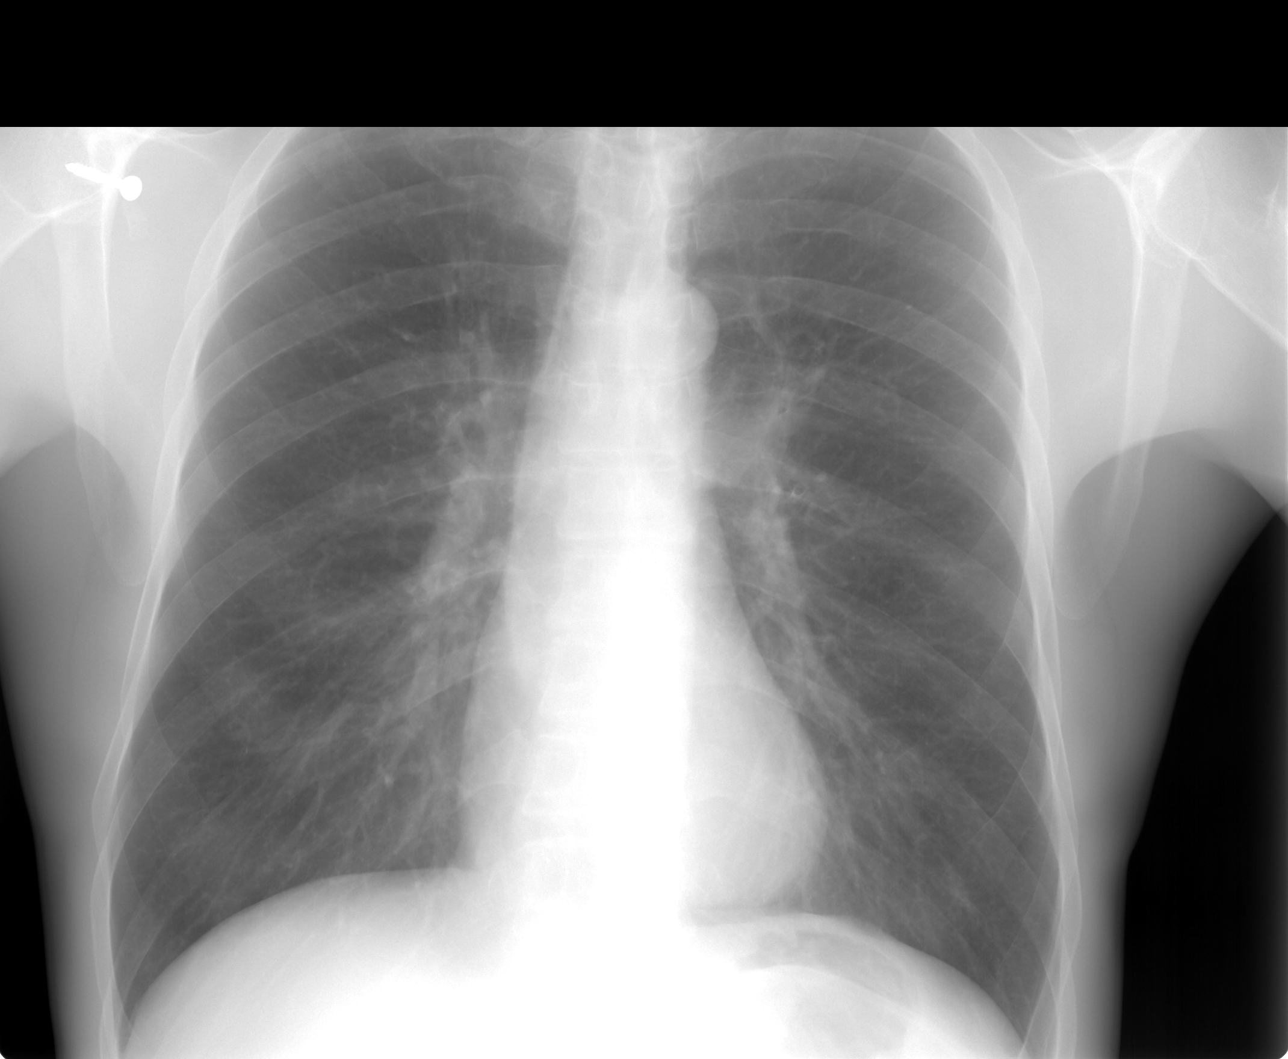

[view not recorded (2 of 3)]
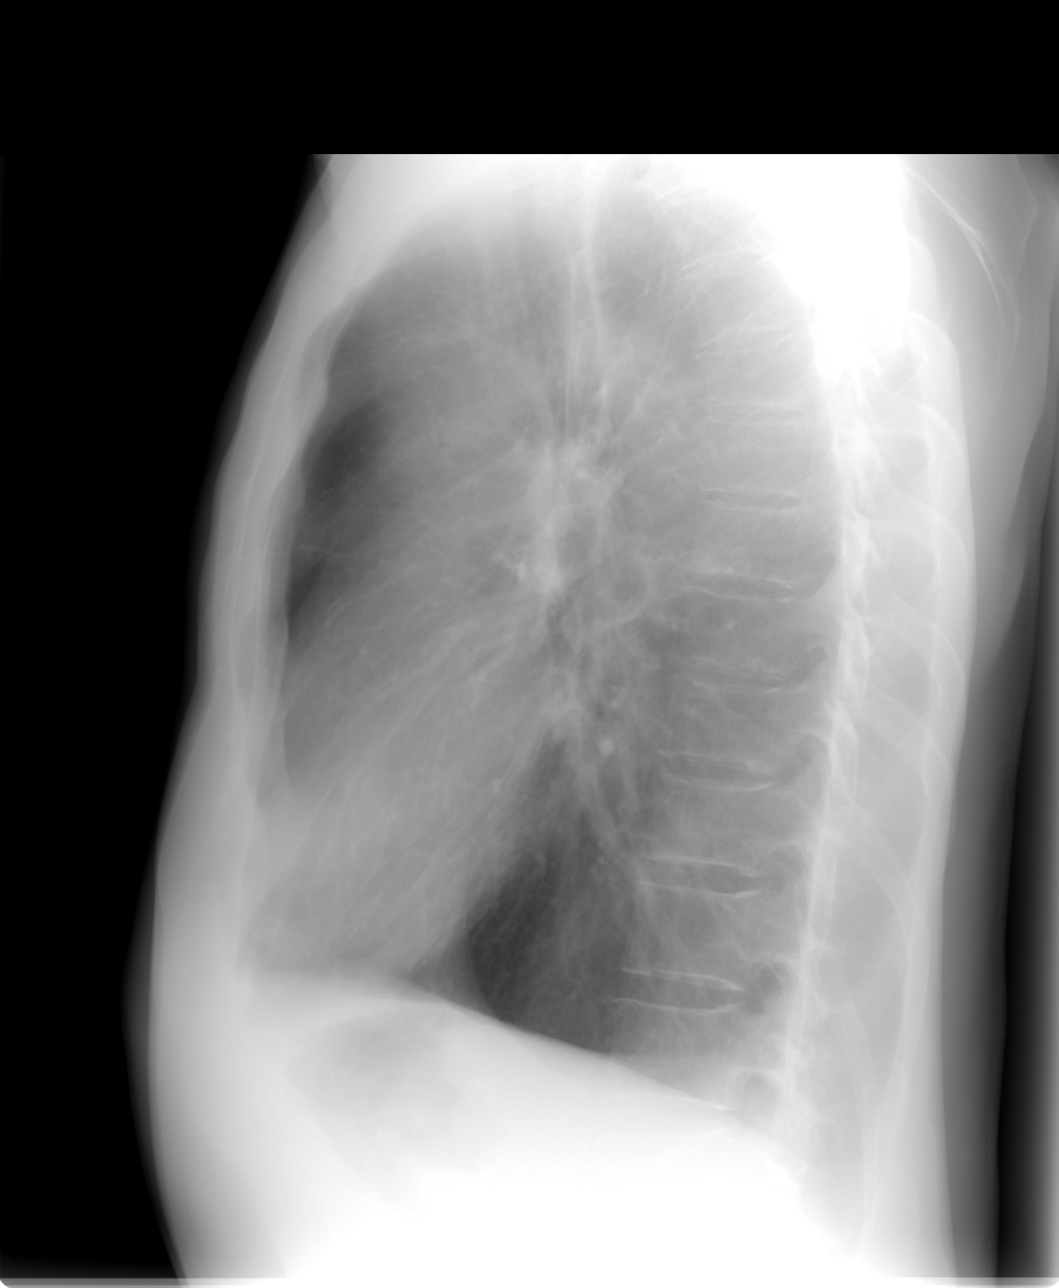

[view not recorded (3 of 3)]
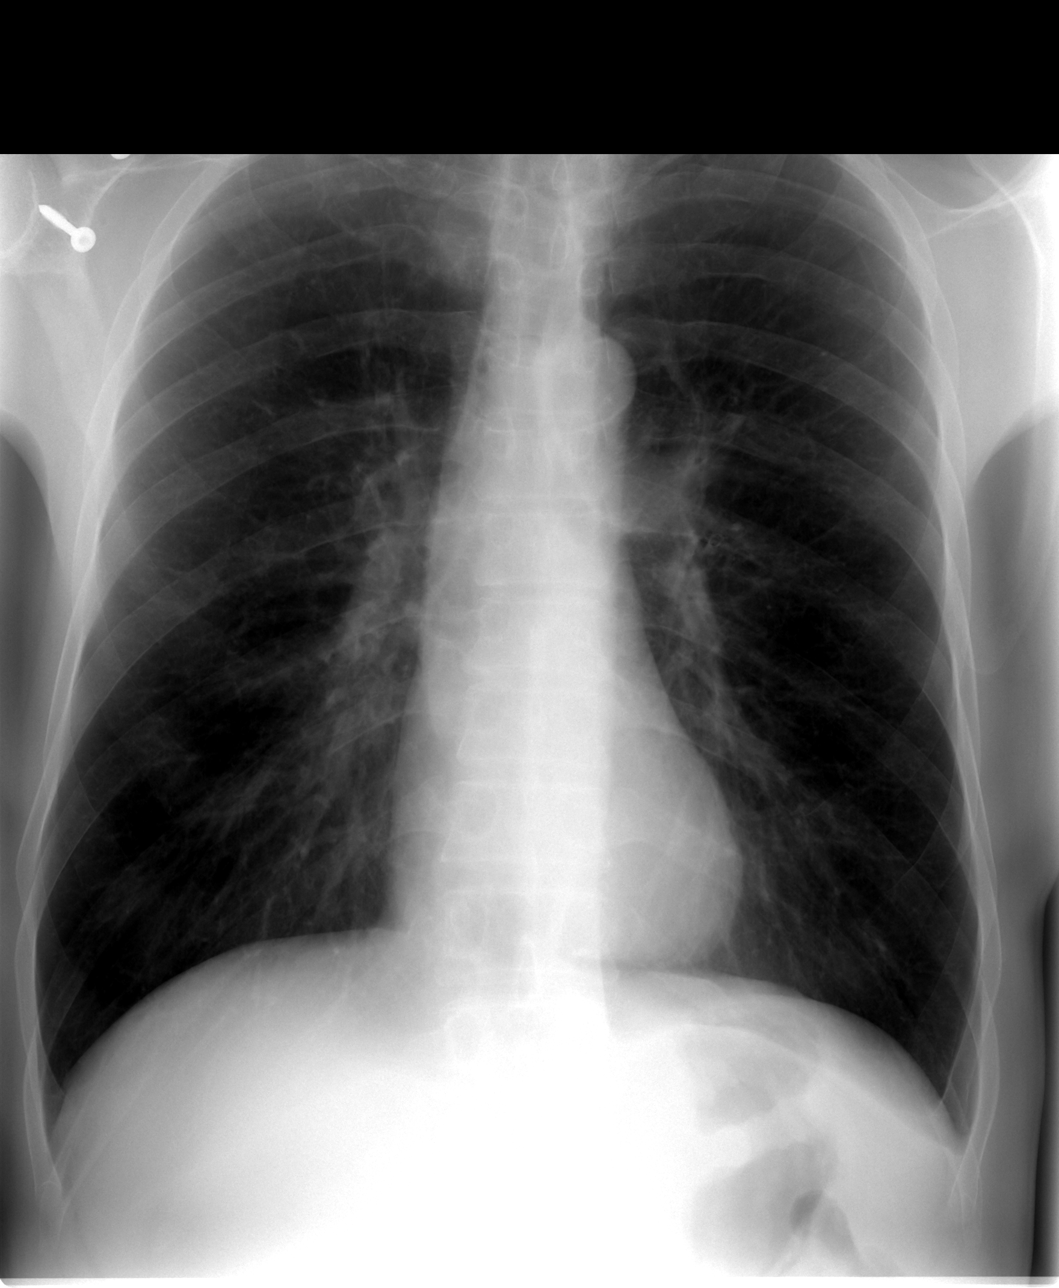

[3 of 3 positions shown; findings below may reference images not displayed]

FINDINGS: Lungs appear hyperexpanded with flattening of the
hemidiaphragms, increased retrosternal air space and pruning of the
pulmonary vasculature in the periphery, suggestive of underlying
COPD.  Bilateral apical pleuroparenchymal thickening, most
compatible with scarring.  No definite consolidative airspace
disease.  No definite pleural effusions.  No evidence of pulmonary
edema.  Heart size is normal.  Mediastinal contours are
unremarkable.  Atherosclerosis in the thoracic aorta.  Fixation
screws in the right scapula are incidentally noted.
IMPRESSION: 1.  No radiographic evidence of acute cardiopulmonary disease.
2.  Chronic changes of COPD, as above.
3.  Atherosclerosis.

## 2014-05-29 ENCOUNTER — Encounter (HOSPITAL_COMMUNITY): Payer: Self-pay

## 2014-05-29 ENCOUNTER — Encounter (HOSPITAL_COMMUNITY): Payer: Self-pay | Admitting: Cardiology

## 2014-05-29 ENCOUNTER — Ambulatory Visit (HOSPITAL_COMMUNITY): Admit: 2014-05-29 | Payer: Self-pay | Admitting: Cardiology

## 2014-05-29 ENCOUNTER — Emergency Department: Payer: Self-pay | Admitting: Emergency Medicine

## 2014-05-29 ENCOUNTER — Encounter (HOSPITAL_COMMUNITY)
Admission: EM | Disposition: A | Discharge status: Home / Self Care | Payer: No Typology Code available for payment source | Source: Other Acute Inpatient Hospital | Attending: Cardiology

## 2014-05-29 ENCOUNTER — Other Ambulatory Visit: Payer: Self-pay

## 2014-05-29 ENCOUNTER — Inpatient Hospital Stay (HOSPITAL_COMMUNITY)
Admission: EM | Admit: 2014-05-29 | Discharge: 2014-05-31 | DRG: 247 | Disposition: A | Discharge status: Home / Self Care | Payer: No Typology Code available for payment source | Source: Other Acute Inpatient Hospital | Attending: Cardiology | Admitting: Cardiology

## 2014-05-29 DIAGNOSIS — I2582 Chronic total occlusion of coronary artery: Secondary | ICD-10-CM | POA: Diagnosis present

## 2014-05-29 DIAGNOSIS — I2119 ST elevation (STEMI) myocardial infarction involving other coronary artery of inferior wall: Principal | ICD-10-CM

## 2014-05-29 DIAGNOSIS — I2510 Atherosclerotic heart disease of native coronary artery without angina pectoris: Secondary | ICD-10-CM

## 2014-05-29 DIAGNOSIS — J438 Other emphysema: Secondary | ICD-10-CM | POA: Diagnosis present

## 2014-05-29 DIAGNOSIS — Z955 Presence of coronary angioplasty implant and graft: Secondary | ICD-10-CM

## 2014-05-29 DIAGNOSIS — R079 Chest pain, unspecified: Secondary | ICD-10-CM | POA: Diagnosis present

## 2014-05-29 DIAGNOSIS — F17200 Nicotine dependence, unspecified, uncomplicated: Secondary | ICD-10-CM | POA: Diagnosis present

## 2014-05-29 DIAGNOSIS — F17210 Nicotine dependence, cigarettes, uncomplicated: Secondary | ICD-10-CM

## 2014-05-29 DIAGNOSIS — Z79899 Other long term (current) drug therapy: Secondary | ICD-10-CM | POA: Diagnosis not present

## 2014-05-29 DIAGNOSIS — Z87891 Personal history of nicotine dependence: Secondary | ICD-10-CM | POA: Diagnosis present

## 2014-05-29 DIAGNOSIS — J449 Chronic obstructive pulmonary disease, unspecified: Secondary | ICD-10-CM

## 2014-05-29 DIAGNOSIS — Z9861 Coronary angioplasty status: Secondary | ICD-10-CM

## 2014-05-29 HISTORY — DX: ST elevation (STEMI) myocardial infarction involving other coronary artery of inferior wall: I21.19

## 2014-05-29 HISTORY — DX: Coronary angioplasty status: Z98.61

## 2014-05-29 HISTORY — PX: LEFT HEART CATH AND CORONARY ANGIOGRAPHY: CATH118249

## 2014-05-29 HISTORY — DX: Atherosclerotic heart disease of native coronary artery without angina pectoris: I25.10

## 2014-05-29 LAB — CBC WITH DIFFERENTIAL/PLATELET
BASOS PCT: 0.9 %
Basophil #: 0.1 10*3/uL (ref 0.0–0.1)
EOS ABS: 0.1 10*3/uL (ref 0.0–0.7)
Eosinophil %: 1.1 %
HCT: 44.1 % (ref 40.0–52.0)
HGB: 14.2 g/dL (ref 13.0–18.0)
Lymphocyte #: 1.4 10*3/uL (ref 1.0–3.6)
Lymphocyte %: 12.9 %
MCH: 28.3 pg (ref 26.0–34.0)
MCHC: 32.2 g/dL (ref 32.0–36.0)
MCV: 88 fL (ref 80–100)
Monocyte #: 0.9 x10 3/mm (ref 0.2–1.0)
Monocyte %: 8 %
Neutrophil #: 8.4 10*3/uL — ABNORMAL HIGH (ref 1.4–6.5)
Neutrophil %: 77.1 %
Platelet: 349 10*3/uL (ref 150–440)
RBC: 5.02 10*6/uL (ref 4.40–5.90)
RDW: 13.2 % (ref 11.5–14.5)
WBC: 10.9 10*3/uL — ABNORMAL HIGH (ref 3.8–10.6)

## 2014-05-29 LAB — BASIC METABOLIC PANEL
Anion Gap: 5 — ABNORMAL LOW (ref 7–16)
BUN: 4 mg/dL — ABNORMAL LOW (ref 7–18)
CHLORIDE: 96 mmol/L — AB (ref 98–107)
CO2: 32 mmol/L (ref 21–32)
Calcium, Total: 8.7 mg/dL (ref 8.5–10.1)
Creatinine: 0.92 mg/dL (ref 0.60–1.30)
Glucose: 151 mg/dL — ABNORMAL HIGH (ref 65–99)
Osmolality: 266 (ref 275–301)
POTASSIUM: 4.2 mmol/L (ref 3.5–5.1)
Sodium: 133 mmol/L — ABNORMAL LOW (ref 136–145)

## 2014-05-29 LAB — LIPID PANEL
Cholesterol: 104 mg/dL (ref 0–200)
HDL: 23 mg/dL — ABNORMAL LOW (ref >39)
LDL Cholesterol: 66 mg/dL (ref 0–99)
Total CHOL/HDL Ratio: 4.5 RATIO
Triglycerides: 76 mg/dL (ref <150)
VLDL: 15 mg/dL (ref 0–40)

## 2014-05-29 LAB — COMPREHENSIVE METABOLIC PANEL
ALT: 8 U/L (ref 0–53)
AST: 17 U/L (ref 0–37)
Albumin: 3.7 g/dL (ref 3.5–5.2)
Alkaline Phosphatase: 51 U/L (ref 39–117)
Anion gap: 12 (ref 5–15)
BUN: 4 mg/dL — ABNORMAL LOW (ref 6–23)
CO2: 26 mEq/L (ref 19–32)
Calcium: 8.4 mg/dL (ref 8.4–10.5)
Chloride: 93 mEq/L — ABNORMAL LOW (ref 96–112)
Creatinine, Ser: 0.64 mg/dL (ref 0.50–1.35)
GFR calc Af Amer: >90 mL/min (ref >90)
GFR calc non Af Amer: >90 mL/min (ref >90)
Glucose, Bld: 126 mg/dL — ABNORMAL HIGH (ref 70–99)
Potassium: 3.5 mEq/L — ABNORMAL LOW (ref 3.7–5.3)
Sodium: 131 mEq/L — ABNORMAL LOW (ref 137–147)
Total Bilirubin: 0.7 mg/dL (ref 0.3–1.2)
Total Protein: 6.7 g/dL (ref 6.0–8.3)

## 2014-05-29 LAB — PROTIME-INR
INR: 0.9
INR: 3.94 — ABNORMAL HIGH (ref 0.00–1.49)
Prothrombin Time: 12.5 secs (ref 11.5–14.7)
Prothrombin Time: 38.5 seconds — ABNORMAL HIGH (ref 11.6–15.2)

## 2014-05-29 LAB — APTT
Activated PTT: 27.4 secs (ref 23.6–35.9)
aPTT: 159 seconds — ABNORMAL HIGH (ref 24–37)

## 2014-05-29 LAB — CBC
HCT: 35.2 % — ABNORMAL LOW (ref 39.0–52.0)
Hemoglobin: 12.2 g/dL — ABNORMAL LOW (ref 13.0–17.0)
MCH: 28.9 pg (ref 26.0–34.0)
MCHC: 34.7 g/dL (ref 30.0–36.0)
MCV: 83.4 fL (ref 78.0–100.0)
PLATELETS: 297 10*3/uL (ref 150–400)
RBC: 4.22 MIL/uL (ref 4.22–5.81)
RDW: 12.7 % (ref 11.5–15.5)
WBC: 9.3 10*3/uL (ref 4.0–10.5)

## 2014-05-29 LAB — CK TOTAL AND CKMB (NOT AT ARMC)
CK TOTAL: 169 U/L (ref 7–232)
CK, MB: 7.5 ng/mL (ref 0.3–4.0)
Relative Index: 4.4 — ABNORMAL HIGH (ref 0.0–2.5)

## 2014-05-29 LAB — TROPONIN I
Troponin I: 0.52 ng/mL (ref <0.30)
Troponin I: >20 ng/mL (ref <0.30)
Troponin-I: <0.02 ng/mL

## 2014-05-29 LAB — MRSA PCR SCREENING: MRSA BY PCR: NEGATIVE

## 2014-05-29 SURGERY — LEFT HEART CATH
Anesthesia: LOCAL

## 2014-05-29 MED ORDER — METOPROLOL SUCCINATE ER 25 MG PO TB24
25.0000 mg | ORAL_TABLET | Freq: Every day | ORAL | Status: DC
  Administered 2014-05-29 – 2014-05-31 (×3): 25 mg via ORAL
  Filled 2014-05-29 (×3): qty 1

## 2014-05-29 MED ORDER — PRASUGREL HCL 10 MG PO TABS
10.0000 mg | ORAL_TABLET | Freq: Every day | ORAL | Status: DC
Start: 2014-05-29 — End: 2014-05-31
  Administered 2014-05-30 – 2014-05-31 (×2): 10 mg via ORAL
  Filled 2014-05-29 (×2): qty 1

## 2014-05-29 MED ORDER — VERAPAMIL HCL 2.5 MG/ML IV SOLN
INTRAVENOUS | Status: AC
  Filled 2014-05-29: qty 2

## 2014-05-29 MED ORDER — HEPARIN (PORCINE) IN NACL 2-0.9 UNIT/ML-% IJ SOLN
INTRAMUSCULAR | Status: AC
  Filled 2014-05-29: qty 500

## 2014-05-29 MED ORDER — FLUTICASONE PROPIONATE 50 MCG/ACT NA SUSP
2.0000 | Freq: Every day | NASAL | Status: DC
  Administered 2014-05-30: 2 via NASAL
  Filled 2014-05-29: qty 16

## 2014-05-29 MED ORDER — SODIUM CHLORIDE 0.9 % IJ SOLN
3.0000 mL | Freq: Two times a day (BID) | INTRAMUSCULAR | Status: DC
  Administered 2014-05-29: 3 mL via INTRAVENOUS
  Administered 2014-05-30: 10 mL via INTRAVENOUS
  Administered 2014-05-30 – 2014-05-31 (×2): 3 mL via INTRAVENOUS

## 2014-05-29 MED ORDER — FENTANYL CITRATE 0.05 MG/ML IJ SOLN
INTRAMUSCULAR | Status: AC
  Filled 2014-05-29: qty 2

## 2014-05-29 MED ORDER — SODIUM CHLORIDE 0.9 % IV SOLN
INTRAVENOUS | Status: AC
  Administered 2014-05-29: 14:00:00 via INTRAVENOUS

## 2014-05-29 MED ORDER — ATROPINE SULFATE 0.1 MG/ML IJ SOLN
INTRAMUSCULAR | Status: AC
  Filled 2014-05-29: qty 10

## 2014-05-29 MED ORDER — PRASUGREL HCL 10 MG PO TABS
ORAL_TABLET | ORAL | Status: AC
  Filled 2014-05-29: qty 6

## 2014-05-29 MED ORDER — MIDAZOLAM HCL 2 MG/2ML IJ SOLN
INTRAMUSCULAR | Status: AC
  Filled 2014-05-29: qty 2

## 2014-05-29 MED ORDER — ONDANSETRON HCL 4 MG/2ML IJ SOLN: 4.0000 mg | Freq: Four times a day (QID) | INTRAMUSCULAR | Status: DC | PRN

## 2014-05-29 MED ORDER — ACETAMINOPHEN 325 MG PO TABS
650.0000 mg | ORAL_TABLET | ORAL | Status: DC | PRN
  Administered 2014-05-29: 650 mg via ORAL
  Filled 2014-05-29: qty 2

## 2014-05-29 MED ORDER — LIDOCAINE HCL (PF) 1 % IJ SOLN
INTRAMUSCULAR | Status: AC
  Filled 2014-05-29: qty 30

## 2014-05-29 MED ORDER — SODIUM CHLORIDE 0.9 % IJ SOLN
3.0000 mL | INTRAMUSCULAR | Status: DC | PRN
  Administered 2014-05-31: 3 mL via INTRAVENOUS

## 2014-05-29 MED ORDER — HEPARIN (PORCINE) IN NACL 2-0.9 UNIT/ML-% IJ SOLN
INTRAMUSCULAR | Status: AC
  Filled 2014-05-29: qty 1000

## 2014-05-29 MED ORDER — ATORVASTATIN CALCIUM 80 MG PO TABS
80.0000 mg | ORAL_TABLET | Freq: Every day | ORAL | Status: DC
  Administered 2014-05-29 – 2014-05-30 (×2): 80 mg via ORAL
  Filled 2014-05-29 (×4): qty 1

## 2014-05-29 MED ORDER — TIOTROPIUM BROMIDE MONOHYDRATE 18 MCG IN CAPS
18.0000 ug | ORAL_CAPSULE | Freq: Every day | RESPIRATORY_TRACT | Status: DC
  Administered 2014-05-29: 18 ug via RESPIRATORY_TRACT
  Filled 2014-05-29 (×2): qty 5

## 2014-05-29 MED ORDER — ALBUTEROL SULFATE (2.5 MG/3ML) 0.083% IN NEBU: 3.0000 mL | INHALATION_SOLUTION | RESPIRATORY_TRACT | Status: DC | PRN

## 2014-05-29 MED ORDER — OXYCODONE-ACETAMINOPHEN 5-325 MG PO TABS
1.0000 | ORAL_TABLET | ORAL | Status: DC | PRN
  Filled 2014-05-29 (×2): qty 2

## 2014-05-29 MED ORDER — ASPIRIN 81 MG PO CHEW
81.0000 mg | CHEWABLE_TABLET | Freq: Every day | ORAL | Status: DC
  Administered 2014-05-30 – 2014-05-31 (×2): 81 mg via ORAL
  Filled 2014-05-29 (×2): qty 1

## 2014-05-29 MED ORDER — SODIUM CHLORIDE 0.9 % IV SOLN: 250.0000 mL | INTRAVENOUS | Status: DC | PRN

## 2014-05-29 MED ORDER — BIVALIRUDIN 250 MG IV SOLR
INTRAVENOUS | Status: AC
  Filled 2014-05-29: qty 250

## 2014-05-29 NOTE — Progress Notes (Signed)
R femoral groin sheath removed.  Pressure initiated at 1600 and continued until 1630. Site at a level 0 prior to, during and after sheath removal.  Bilateral pedal pulses +2 throughout and after sheath pull.  Pt educated to hold pressure to groin site if he coughs or sneezes.  Pt aware to let me know if he feels any oozing/bleeding or pain. Emotional support given to pt.

## 2014-05-29 NOTE — CV Procedure (Addendum)
CARDIAC CATHETERIZATION AND PERCUTANEOUS CORONARY INTERVENTION REPORT  NAME:  Ryan Cantrell   MRN: [id number] DOB:  20-Apr-1961   ADMIT DATE: 05/29/2014 Procedure Date: 05/29/2014  INTERVENTIONAL CARDIOLOGIST: Leonie Man, M.D., MS PRIMARY CARE PROVIDER: Viviana Simpler, MD PRIMARY CARDIOLOGIST: Leonie Man, MD  PATIENT:  Ryan Cantrell is a 53 y.o. male past medical history of long-term smoking and moderate to severe COPD with no factors of hypertension hyperlipidemia or diabetes. He presented to [facility] at 10 AM this morning (05/29/2014) with persistent burning/pressure in his chest radiating out to his arm. Began abruptly at 30 the morning. He's been noting intermittent episodes lasting 3-5 minutes of similar type symptoms but not radiating to his arm and not lasting as long. It was not severe either. This morning when the pain and pressure radiating to left arm and he started getting clammy and cool, he felt like he needed to go to emergency room. The pain upon arrival to the restroom and [place] [facility] was roughly 8/10. Astute clinical decision-making by the ER MD (Dr. Corky Downs) lead to: Code stenting first subtle inferior ST elevations in leads 2, 3 and aVF as well as depressions in 1 aVL and V5 and V6. These elevations were maybe 1 mm at best but still subtle with artifact.  Based on his presenting symptoms, in discussion with Dr. Corky Downs, the best course of action was to activate Code STEMI.  He was given 325 mg aspirin and 4000 Units IV Heparin. He was transferred directly to [facility] to the ER to the cardiac catheterization lab. He arrived in the cardiac catheterization lab at roughly 1210 PM. I met him in March he noted he was still having 6-8 out 10 chest discomfort. Reviewed EKG did not change my opinion that this clinical presentation was consistent with acute coronary syndrome.  PRE-OPERATIVE DIAGNOSIS:    Inferior ST Elevation MI  PROCEDURES  PERFORMED:    Left Heart Catheterization with Native Coronary  Angiography via Right Radial Artery   Left Ventriculography: Via Right Common Femoral Artery  Percutaneous Coronary Intervention of the Distal RCA 100% thrombotic occlusion: Promus Premier DES 2.5 mm x 12 mm (post dilated 2.8 mm)  PROCEDURE: The patient was brought to the 2nd [place] Cardiac Catheterization Lab via EMS. He was prepped and draped in the usual sterile fashion for both right Radial anCommon Femoral artery access. A modified Allen's test was performed on the right is wrist demonstrating excellent collateral flow for radial access.   Sterile technique was used including antiseptics, cap, gloves, gown, hand hygiene, mask and sheet. Skin prep: Chlorhexidine.   Consent: Risks of procedure as well as the alternatives and risks of each were explained to the (patient/caregiver). Emergency verbal consent for procedure obtained.   Time Out: Verified patient identification, verified procedure, site/side was marked, verified correct patient position, special equipment/implants available, medications/allergies/relevent history reviewed, required imaging and test results available. Performed.  Access:   Right Radial Artery: 6 Fr Sheath -  Seldinger Technique (Angiocath Micropuncture Kit); 1214 hours  Radial Cocktail - 10 mL; IV Angiomax bolus  Additional 3 mg IV verapamil was administered due to spasm.  Guide catheter was not able to advance beyond the elbow, therefore the case was converted to femoral and TR band applied due to significant spasm of the radial artery.  TR Band: 12:25  Hours; 10 mL air  Right Common Femoral Artery: 6 Fr Sheath -  fluoroscopically guided modified Seldinger Technique using A.  ultrasound needle (SmartNeedle) the artery was very difficult to palpate   Converting from radial to femoral access in order to perform PCI to a roughly 10 minute delay, as femoral access was not easy, and required  ultrasound guidance  Diagnostic Coronary Angiography: 5 Fr TIG 4.0 Catheter was advanced over a long-exchange safetry J-wire;   Left& Right Coronary Artery Cineangiography: TIG 4.0 Catheter  Unable to advance 6 Fr Guide via Radial sheath due to spasm --> converted to Femoral Axis  Coronary Anatomy:  Dominance: Right  Left Main: Moderately calcified, large caliber vessel that bifurcates into the LAD and Left Circumflex. Angiographically normal. LAD: Normal caliber vessel with mild to moderate proximal calcification. It gives off a major first diagonal branch that has 40-50% proximal stenosis. The LAD itself is relatively free of disease and reaches down around the apex perfusing the distal one third of the apex. There is collateral flow noted to the PDA from the distal LAD and septal perforators.  D1: Moderate caliber vessel, with 45% proximal stenosis. It bifurcates shortly after that stenosis in the one smaller one major branch.  Left Circumflex: Normal caliber, nondominant vessel, gives off OM1 and pocket to the group into OM 2 and the circumflex terminates as LP L1. There is diffuse calcification in the proximal segment, but no angiographically significant disease.  OM1: Small moderate caliber vessel with no significant disease.  OM 2: Moderate-large caliber vessel that bifurcates into 2 major branches the superior branch also bifurcates distally. It reaches almost to the inferolateral apex. Minimal luminal irregularities. .  RCA: Normal caliber, dominant vessel with mild diffuse luminal irregularities. Distally before the crux there is roughly 40% stenosis in the vessel has an occluded and a small distal marginal branch prior to the bifurcation. Retrograde flow from the left coronary angiography revealed an occlusion would probably be just prior to bifurcation into Right Posterior Descending Artery (RPDA) and Right Posterior Groove Branch (RPAV).  Post angioplasty revealed a residual 80%  stenosis at the takeoff of a small distal branch. There is moderate caliber PDA which results the apex and very tortuous fashion. The posterolateral system includes 2 small moderate caliber posterolateral branches in the AV nodal artery.  After reviewing the initial angiography, the culprit lesion was thought to be 100% thrombotic occlusion of the distal RCA with TIMI 0 flow.  Preparation were made to proceed with PCI on this lesion.  Percutaneous Coronary Intervention:    Initially attempt was made to advance a 6 [place] JR 4 guide catheter through the radial approach, however the new catheter would not reach beyond the elbow. This was due to significant spasm. Decision made to remove the catheter and convert to femoral access. There was a time delay as the catheter was slowly removed along with the wire. Because of the intensive spasm I decided to go ahead and remove the sheath in place and TR band. Hemostasis was obtained.   Attention was then turned to the right common femoral artery for access. Difficult to palpate pulses but the use of a ultrasound-guided SmartNeedle for access. Femoral access was obtained at 1234 hours Femoral Approach Guide: 6 Fr   JR 4 Guidewire:  [place] Predilation Balloon:  Emerge 2.0 mm x 12 mm;   10 Atm x 20 Sec -- 1243 hours; restored TIMI 2 flow distally   With restoration of distal flow, the patient became bradycardic with heart rates in the 30s and blood pressure dropping into the 50 mm mercury range. Of atropine was  administered and dopamine infusion was started initially at 20 mg per minute. Subsequently weaned down to 2.5 mg per minute prior to completion the PCI and then discontinued following the left ventriculography. Stent:  Promus Premier DES 2.5 mm x 12 mm;   Deployed: 16 Atm x 30 Sec; Post-dilated: 18 Atm x 45 Sec   Post-dilation Balloon: [place] 2.75 mm x 8 mm;   18 Atm x 45 Sec  Final Diameter: 2.8 mm; TIMI-3 flow  Post deployment angiography in  multiple views, with and without guidewire in place revealed excellent stent deployment and lesion coverage.  There was no evidence of dissection or perforation. TIMI-3 flow was restored distally.  After completion of the PCI, a 5 French straight pigtail catheter was advanced over the wire and across into the left ventricle for hemodynamic measurements and left ventriculography.   The catheter was then removed out of the body completely over wire. The sheath was sutured in place to be removed in the CCU and manual pressure held for hemostasis.   FINDINGS:  Hemodynamics:   Central Aortic Pressure / Mean:  104/60/79  mmHg  Left Ventricular Pressure / LVEDP:  104/14/19 mmHg  Left Ventriculography:  EF: 55-60  %  Wall Motion:  mild basal inferior hypokinesis with hyperdynamic anterior wall   MEDICATIONS:  Anesthesia:  Local Lidocaine  2 mL for wrist, 20 mL for femoral  Sedation:   4  mg IV Versed, 50  mcg IV fentanyl ;   Premedication: 4000 mg IV heparin, 325 mg aspirin given at [facility]  Omnipaque Contrast: 180 ml  Anticoagulation:  Angiomax Bolus & drip; the effusion was continued until the initial bag had been completed  Anti-Platelet Agent:  Effient 60 mg  Atropine IV and 1 Amp  IV Dopamine infusion initially started at 20 mg per minute reduced to off upon completion of PCI  Intracoronary Nitroglycerin 200 mcg x1  PATIENT DISPOSITION:    The patient was transferred to the PACU holding area in a hemodynamicaly stable, chest pain free condition. Dopamine infusion was complete as was the Angiomax infusion.  The patient tolerated the procedure well, and there were no complications.  EBL:   < 10 ml  The patient was stable before, during, and after the procedure.  POST-OPERATIVE DIAGNOSIS:    Severe single-vessel disease of the distal RCA with 100% thrombotic occlusion successfully treated with a single Promus Premier DES (2.5 mm x 12 mm postdilated to 2.8 mm)  Preserved LVEF  with moderately elevated LVEDP.  Significant vasospasm of the radial artery, probably related to cigarette smoking.  Also noted was mild to moderate calcification of left coronary system.  PLAN OF CARE:  Admit to CCU with possible fast track discharge.  Dual antiplatelet therapy for minimum of one year (aspirin plus Effient initiated)  Check 2-D echocardiogram for baseline function  We'll initiate low-dose long-acting beta blocker and statin.  Check cardiac risk factors including TSH, lipid panel and hemoglobin A1c.  Smoking cessation counseling.  Continue home COPD medications.  He can followup with me in the [place] office following discharge.  Leonie Man, M.D., M.S. Interventional Cardiologist   Pager # [phone]

## 2014-05-29 NOTE — H&P (Signed)
History and Physical Note:  NAME:  Ryan Cantrell   MRN: [id number] DOB:  06-Mar-1961   ADMIT DATE: 05/29/2014  PCP: Viviana Simpler, MD  05/29/2014 Arrival to Cath Lab 12:10 - via ER without stopping.  Ryan Cantrell is a 53 y.o. male with a history of COPD/emphysema and long-term smoking who had noticed intermittent episodes of food and 5 minutes of heartburn and pressure sensation in his chest over the last week. In noted in more severe 8/10 pressure and burning in his chest radiating across left chest with left arm associated with diaphoresis and shortness of breath. When the symptom radiated to his arm he did something was wrong and went to the closest emergency room which was at [facility]. He presented to [facility] at 10 AM this morning (05/29/2014) with persistent burning/pressure in his chest radiating out to his arm. Began abruptly at 30 the morning. He's been noting intermittent episodes lasting 3-5 minutes of similar type symptoms but not radiating to his arm and not lasting as long. It was not severe either. This morning when the pain and pressure radiating to left arm and he started getting clammy and cool, he felt like he needed to go to emergency room. The pain upon arrival to the restroom and [place] [facility] was roughly 8/10.  Astute clinical decision-making by the ER MD (Dr. Corky Downs) lead to: Code stenting first subtle inferior ST elevations in leads 2, 3 and aVF as well as depressions in 1 aVL and V5 and V6. These elevations were maybe 1 mm at best but still subtle with artifact. Based on his presenting symptoms, in discussion with Dr. Corky Downs, the best course of action was to activate Code STEMI. He was given 325 mg aspirin and 4000 Units IV Heparin. He was transferred directly to [facility] to the ER to the cardiac catheterization lab. He arrived in the cardiac catheterization lab at roughly 1210 PM.  I met him in March he noted he was still having 6-8 out 10 chest  discomfort. Reviewed EKG did not change my opinion that this clinical presentation was consistent with acute coronary syndrome  Past Medical History  Diagnosis Date  . COPD (chronic obstructive pulmonary disease)      PFTs 2013.. Mod severe COPD...   . Emphysema    Past Surgical History  Procedure Laterality Date  . Inguinal hernia repair      twice on right and once on left-- 1980's-90's  . Right shoulder surgery  1987  . Fracture left humerus  2009    no surgery. Torn rotator cuff also   FAMHx: Family History  Problem Relation Age of Onset  . Hypertension Mother   . Diabetes Mother   . Heart disease Mother     stent  . Cancer Father     prostate  . Hypertension Father   . Gout Father   . Heart disease Father     Stent  . Colon cancer Neg Hx   . Stomach cancer Neg Hx    SOCHx:  reports that he has been smoking Cigarettes.  He has been smoking about 0.00 packs per day. He has never used smokeless tobacco. He reports that he does not drink alcohol or use illicit drugs.  ALLERGIES: Allergies  Allergen Reactions  . Codeine Sulfate Nausea And Vomiting    HOME MEDICATIONS: Prescriptions prior to admission  Medication Sig Dispense Refill  . albuterol (PROAIR HFA) 108 (90 BASE) MCG/ACT inhaler Inhale 2 puffs  into the lungs 3 (three) times daily as needed.  8.5 g  6  . fluticasone (FLONASE) 50 MCG/ACT nasal spray Place 2 sprays into both nostrils daily.  16 g  6  . Multiple Vitamin (MULTIVITAMIN) tablet Take 1 tablet by mouth daily.      Marland Kitchen tiotropium (SPIRIVA HANDIHALER) 18 MCG inhalation capsule Place 1 capsule (18 mcg total) into inhaler and inhale daily.  30 capsule  12  . triamcinolone cream (KENALOG) 0.1 % Apply 1 application topically 2 (two) times daily as needed.  30 g  5   Review of Systems  Constitutional: Positive for diaphoresis. Negative for fever, chills, weight loss and malaise/fatigue.  HENT: Negative for congestion, nosebleeds and sore throat.   Eyes:  Negative for blurred vision and double vision.  Respiratory: Positive for cough, shortness of breath and wheezing. Negative for stridor.        Chronic symptoms of COPD.  Cardiovascular: Positive for chest pain. Negative for palpitations, orthopnea, claudication, leg swelling and PND.  Gastrointestinal: Positive for heartburn. Negative for blood in stool and melena.  Musculoskeletal: Negative for back pain, joint pain and myalgias.  Neurological: Negative for dizziness, sensory change, speech change, focal weakness, seizures, loss of consciousness and headaches.  Endo/Heme/Allergies: Negative.   All other systems reviewed and are negative.  PHYSICAL EXAM: 136/80 mmHg HR 84 General appearance: alert, cooperative, appears stated age, moderate distress and Relatively healthy-appearing. Neck: no adenopathy, no carotid bruit, no JVD and supple, symmetrical, trachea midline Lungs: clear to auscultation bilaterally, normal percussion bilaterally and Mild interstitial sounds and late expiratory wheeze Heart: regular rate and rhythm, S1, S2 normal, no murmur, click, rub or gallop and normal apical impulse Abdomen: soft, non-tender; bowel sounds normal; no masses,  no organomegaly Extremities: extremities normal, atraumatic, no cyanosis or edema Pulses: 2+ and symmetric Skin: Skin color, texture, turgor normal. No rashes or lesions Neurologic: Grossly normal   Adult ECG Report 1043 AM  Rate: 58 ;  Rhythm: sinus bradycardia ;   QRS Axis: 94 ;  PR Interval: 120 ;  QRS Duration: 104 ; QTc: 433  Voltages: normal  Conduction Disturbances: none  Other Abnormalities: ~1 mm STE in III, aVF with ~1 mm ST Depression (& TWI) in 1, aVL, V1-V6   Narrative Interpretation: Subtle changes but concerning for inferior ST elevation MI (Inferior Injury Pattern with with reciprocal changes)   IMPRESSION & PLAN The patients' history has been reviewed, patient examined, no change in status from most recent note,  stable for surgery. I have reviewed the patients' chart and labs. Questions were answered to the patient's satisfaction.    WALDEN STATZ has presented today for surgery, with the diagnosis of Inferior STEMI. The various methods of treatment have been discussed with the patient and family.   Risks / Complications include, but not limited to: Death, MI, CVA/TIA, VF/VT (with defibrillation), Bradycardia (need for temporary pacer placement), contrast induced nephropathy, bleeding / bruising / hematoma / pseudoaneurysm, vascular or coronary injury (with possible emergent CT or Vascular Surgery), adverse medication reactions, infection.     After consideration of risks, benefits and other options for treatment, the patient has verbally consented for an  Emergent to Procedure(s):  LEFT HEART CATHETERIZATION AND CORONARY ANGIOGRAPHY +/- AD [place]   as a surgical intervention.   We will proceed with the planned procedure.  Further plans per CATH Note.   Leonie Man, M.D., M.S. Interventional Cardiologist   Pager # [phone]   05/29/2014  1:19 PM

## 2014-05-29 NOTE — Progress Notes (Signed)
05/29/14 1200  Clinical Encounter Type  Visited With Patient;Family;Health care provider  Visit Type Code   Chaplain received a Code Stemi page at roughly 11:01 AM. Chaplain reported to the ED and the EMS had not yet arrived. Chaplain reported back to the ED at noon and was told patient was already in the cath lab. Chaplain reported to the Cath lab where patient was already being worked on by the medical team. Chaplain made contact with patient's physician for the procedure. Chaplain also spoke to the team of people who transported the patient to the hospital. They informed the Chaplain that the patient's wife was on her way to the hospital behind them. Chaplain made ED aware that that family was on the way for this patient. Chaplain was notified of the arrival of patient's family at 12:19PM. Chaplain connected with the patient's family and moved them from the ED waiting room into the [facility] waiting room. Chaplain notified a member of the Cath Lab medical team that patient's family was waiting in 2H. Chaplain will continue to provide emotional and spiritual support as needed. Page On-call Chaplain if needed. Cranston Neighbor, Chaplain 12:45 PM

## 2014-05-30 ENCOUNTER — Encounter (HOSPITAL_COMMUNITY): Payer: Self-pay | Admitting: *Deleted

## 2014-05-30 DIAGNOSIS — I2119 ST elevation (STEMI) myocardial infarction involving other coronary artery of inferior wall: Secondary | ICD-10-CM

## 2014-05-30 LAB — CBC
HEMATOCRIT: 36.9 % — AB (ref 39.0–52.0)
Hemoglobin: 12.5 g/dL — ABNORMAL LOW (ref 13.0–17.0)
MCH: 29.1 pg (ref 26.0–34.0)
MCHC: 33.9 g/dL (ref 30.0–36.0)
MCV: 85.8 fL (ref 78.0–100.0)
Platelets: 270 10*3/uL (ref 150–400)
RBC: 4.3 MIL/uL (ref 4.22–5.81)
RDW: 12.9 % (ref 11.5–15.5)
WBC: 8.7 10*3/uL (ref 4.0–10.5)

## 2014-05-30 LAB — POCT I-STAT, CHEM 8
BUN: <3 mg/dL — ABNORMAL LOW (ref 6–23)
CALCIUM ION: 1.14 mmol/L (ref 1.12–1.23)
Chloride: 96 mEq/L (ref 96–112)
Creatinine, Ser: 0.6 mg/dL (ref 0.50–1.35)
Glucose, Bld: 132 mg/dL — ABNORMAL HIGH (ref 70–99)
HCT: 37 % — ABNORMAL LOW (ref 39.0–52.0)
Hemoglobin: 12.6 g/dL — ABNORMAL LOW (ref 13.0–17.0)
Potassium: 3.4 mEq/L — ABNORMAL LOW (ref 3.7–5.3)
SODIUM: 131 meq/L — AB (ref 137–147)
TCO2: 23 mmol/L (ref 0–100)

## 2014-05-30 LAB — TROPONIN I
Troponin I: >20 ng/mL (ref <0.30)
Troponin I: >20 ng/mL (ref <0.30)
Troponin I: 12.46 ng/mL (ref <0.30)

## 2014-05-30 LAB — BASIC METABOLIC PANEL
Anion gap: 11 (ref 5–15)
BUN: 5 mg/dL — ABNORMAL LOW (ref 6–23)
CO2: 26 meq/L (ref 19–32)
Calcium: 9 mg/dL (ref 8.4–10.5)
Chloride: 99 mEq/L (ref 96–112)
Creatinine, Ser: 0.71 mg/dL (ref 0.50–1.35)
GFR calc Af Amer: >90 mL/min (ref >90)
GFR calc non Af Amer: >90 mL/min (ref >90)
Glucose, Bld: 108 mg/dL — ABNORMAL HIGH (ref 70–99)
POTASSIUM: 4 meq/L (ref 3.7–5.3)
Sodium: 136 mEq/L — ABNORMAL LOW (ref 137–147)

## 2014-05-30 LAB — POCT ACTIVATED CLOTTING TIME: Activated Clotting Time: 253 seconds

## 2014-05-30 LAB — GLUCOSE, CAPILLARY
GLUCOSE-CAPILLARY: 97 mg/dL (ref 70–99)
GLUCOSE-CAPILLARY: 127 mg/dL — AB (ref 70–99)
Glucose-Capillary: 110 mg/dL — ABNORMAL HIGH (ref 70–99)

## 2014-05-30 LAB — TSH: TSH: 2.21 u[IU]/mL (ref 0.350–4.500)

## 2014-05-30 LAB — LIPID PANEL
Cholesterol: 107 mg/dL (ref 0–200)
HDL: 23 mg/dL — ABNORMAL LOW (ref >39)
LDL CALC: 58 mg/dL (ref 0–99)
TRIGLYCERIDES: 129 mg/dL (ref <150)
Total CHOL/HDL Ratio: 4.7 RATIO
VLDL: 26 mg/dL (ref 0–40)

## 2014-05-30 LAB — HEMOGLOBIN A1C
HEMOGLOBIN A1C: 6 % — AB (ref <5.7)
Hgb A1c MFr Bld: 5.7 % — ABNORMAL HIGH (ref <5.7)
MEAN PLASMA GLUCOSE: 117 mg/dL — AB (ref <117)
Mean Plasma Glucose: 126 mg/dL — ABNORMAL HIGH (ref <117)

## 2014-05-30 MED FILL — Sodium Chloride IV Soln 0.9%: INTRAVENOUS | Qty: 50 | Status: AC

## 2014-05-30 NOTE — Progress Notes (Signed)
  Echocardiogram 2D Echocardiogram has been performed.  Arvil Chaco 05/30/2014, 9:53 AM

## 2014-05-30 NOTE — Progress Notes (Signed)
CARDIAC REHAB PHASE I   PRE:  Rate/Rhythm: 78 SR  BP:  Supine: 110/58  Sitting:   Standing:    SaO2:   MODE:  Ambulation: 550 ft   POST:  Rate/Rhythm: 72 SR  BP:  Supine:   Sitting: 124/70  Standing:    SaO2:  1055-1200 Pt walked 550 ft with steady gait. No CP. Tolerated well. MI ed completed with pt who voiced understanding. Discussed smoking cessation and gave handouts. Pt stated he had chantix at home. Encouraged him to discuss with cardiologist before starting. He said he might would like to use hypnosis. Encouraged pt to call 1800quitnow for coaching. Wife smokes but he states she will go outside if needed. Gave carb counting and heart healthy diets. Pt has HGA1C of 6.0. Discussed with pt need to cut sugars and starches. Pt stated he drinks a lot of regular soda. Discussed with pt cutting down and switching to diet. Encouraged water also. Gave stent card and he has effient packet and knows importance of taking effient. Reviewed NTG use. Discussed CRP 2 and pt gave permission to refer to [place] Phase 2. Encouraged to start regular exercise of walking and watching diet to help get HGA1C down. Gave ex ed.   Luetta Nutting, RN BSN  05/30/2014 11:55 AM

## 2014-05-30 NOTE — Progress Notes (Signed)
Utilization review complete 

## 2014-05-30 NOTE — Progress Notes (Signed)
    Subjective:  Denies CP or dyspnea   Objective:  Filed Vitals:   05/30/14 0400 05/30/14 0500 05/30/14 0600 05/30/14 0700  BP: 91/57 122/62 96/52 130/57  Pulse: 61 67 58 68  Temp: 98.8 F (37.1 C)   98.3 F (36.8 C)  TempSrc: Oral   Oral  Resp: Height:      Weight:      SpO2: 97% 99% 97% 100%    Intake/Output from previous day:  Intake/Output Summary (Last 24 hours) at 05/30/14 0809 Last data filed at 05/30/14 0000  Gross per 24 hour  Intake 1107.33 ml  Output   1200 ml  Net -92.67 ml    Physical Exam: Physical exam: Well-developed well-nourished in no acute distress.  Skin is warm and dry.  HEENT is normal.  Neck is supple. Chest is clear to auscultation with normal expansion.  Cardiovascular exam is regular rate and rhythm.  Abdominal exam nontender or distended. No masses palpated. Extremities show no edema. Right radial cath site with no hematoma; right groin with no hematoma and no bruit neuro grossly intact    Lab Results: Basic Metabolic Panel:  Recent Labs  16/10/96 1305 05/30/14 0125  NA 131* 136*  K 3.5* 4.0  CL 93* 99  CO2 26 26  GLUCOSE 126* 108*  BUN 4* 5*  CREATININE 0.64 0.71  CALCIUM 8.4 9.0   CBC:  Recent Labs  05/29/14 1305 05/30/14 0125  WBC 9.3 8.7  HGB 12.2* 12.5*  HCT 35.2* 36.9*  MCV 83.4 85.8  PLT 297 270   Cardiac Enzymes:  Recent Labs  05/29/14 1306 05/29/14 2145 05/30/14 0125  CKTOTAL 169  --   --   CKMB 7.5*  --   --   TROPONINI 0.52* >20.00* >20.00*   Telemetry-sinus rhythm with nonsustained ventricular tachycardia.  Assessment/Plan:  1 status post inferior infarct with PCI of RCA-continue aspirin, statin, beta blocker and effient. Transferred to telemetry. Possible discharge tomorrow morning if stable. Await echocardiogram for LV function. 2 tobacco abuse-patient counseled on discontinuing.  3 COPD-continue bronchodilators. Need followup chest x-ray results from [facility] emergency  room. 4 elevated hemoglobin A1C.-patient will need followup for this with his primary care physician and dietary restriction; may need oral hypoglycemic in the future; check CBGs.  Olga Millers 05/30/2014, 8:09 AM

## 2014-05-30 NOTE — Care Management Note (Addendum)
    Page 1 of 1   05/31/2014     11:17:34 AM CARE MANAGEMENT NOTE 05/31/2014  Patient:  Ryan Cantrell, Ryan Cantrell   Account Number:  [id number]  Date Initiated:  05/30/2014  Documentation initiated by:  Junius Creamer  Subjective/Objective Assessment:   adm w mi     Action/Plan:   lives w wife   Anticipated DC Date:     Anticipated DC Plan:  HOME/SELF CARE      DC Planning Services  CM consult  Medication Assistance      Choice offered to / List presented to:             Status of service:   Medicare Important Message given?  NO (If response is "NO", the following Medicare IM given date fields will be blank) Date Medicare IM given:   Medicare IM given by:   Date Additional Medicare IM given:   Additional Medicare IM given by:    Discharge Disposition:  HOME/SELF CARE  Per UR Regulation:  Reviewed for med. necessity/level of care/duration of stay  If discussed at Long Length of Stay Meetings, dates discussed:    Comments:  05/31/14- 1115- Donn Pierini RN, BSN [phone] per rep at [employer]: covered/ non-preferred  auth required ph# [phone] / $ at retail/ $80 for 30 day supply/$240 for 90 supply sticky note left for MD requiring pre auth need and bedside RN aware also  9/28 1011 debbie dowell rn,bsn gave pt 30day free effient card and copay assist card. pt has [employer] ins.

## 2014-05-31 ENCOUNTER — Telehealth: Payer: Self-pay | Admitting: Cardiology

## 2014-05-31 DIAGNOSIS — Z9861 Coronary angioplasty status: Secondary | ICD-10-CM

## 2014-05-31 LAB — GLUCOSE, CAPILLARY: GLUCOSE-CAPILLARY: 112 mg/dL — AB (ref 70–99)

## 2014-05-31 LAB — TROPONIN I
TROPONIN I: 14.67 ng/mL — AB (ref <0.30)
Troponin I: 10.2 ng/mL (ref <0.30)

## 2014-05-31 MED ORDER — ASPIRIN 81 MG PO TABS: 81.0000 mg | ORAL_TABLET | Freq: Every day | ORAL | Status: DC

## 2014-05-31 MED ORDER — PRASUGREL HCL 10 MG PO TABS: 10.0000 mg | ORAL_TABLET | Freq: Every day | ORAL | Status: DC

## 2014-05-31 MED ORDER — METOPROLOL SUCCINATE ER 25 MG PO TB24: 25.0000 mg | ORAL_TABLET | Freq: Every day | ORAL | Status: DC

## 2014-05-31 MED ORDER — NITROGLYCERIN 0.4 MG SL SUBL: 0.4000 mg | SUBLINGUAL_TABLET | SUBLINGUAL | Status: DC | PRN

## 2014-05-31 NOTE — Progress Notes (Signed)
Patient Name: Ryan SquiresMark A Cantrell Date of Encounter: 05/31/2014  Principal Problem:   ST elevation myocardial infarction (STEMI) of inferior wall, initial episode of care Active Problems:   Moderate  severe COPD (chronic obstructive pulmonary disease)   Cigarette smoker   CAD S/P percutaneous coronary angioplasty: Inf STEMI - 100% dRCA - PCI Promus P 2.5 mm x 12mm (2.178mm)    Patient Profile: 53 yo male w/ no previous CAD, tx [facility]  09/27 w/ IMI. PCI RCA, NSVT seen on tele, no sx.   SUBJECTIVE: No chest pain or SOB  OBJECTIVE Filed Vitals:   05/30/14 0927 05/30/14 1005 05/30/14 1046 05/30/14 2100  BP: 108/63 112/71 124/63 110/65  Pulse: 61 71 62 58  Temp:   99.3 F (37.4 C) 98.6 F (37 C)  TempSrc:   Oral   Resp: 23   16  Height:      Weight:      SpO2: 99%  98% 99%    Intake/Output Summary (Last 24 hours) at 05/31/14 0847 Last data filed at 05/30/14 1030  Gross per 24 hour  Intake     10 ml  Output      0 ml  Net     10 ml   Filed Weights   05/29/14 1353  Weight: 145 lb 8.1 oz (66 kg)    PHYSICAL EXAM General: Well developed, well nourished, male in no acute distress. Head: Normocephalic, atraumatic.  Neck: Supple without bruits, JVD seen to jaw on left. Lungs:  Resp regular and unlabored, few dry rales Heart: RRR, S1, S2, no S3, S4, or murmur; no rub. Abdomen: Soft, non-tender, non-distended, BS + x 4.  Extremities: No clubbing, cyanosis, no edema.  Neuro: Alert and oriented X 3. Moves all extremities spontaneously. Psych: Normal affect.  LABS: CBC: Recent Labs  05/29/14 1305 05/30/14 0125  WBC 9.3 8.7  HGB 12.2* 12.5*  HCT 35.2* 36.9*  MCV 83.4 85.8  PLT 297 270   INR: Recent Labs  05/29/14 1305  INR 3.94*   Basic Metabolic Panel: Recent Labs  05/29/14 1305 05/30/14 0125  NA 131* 136*  K 3.5* 4.0  CL 93* 99  CO2 26 26  GLUCOSE 126* 108*  BUN 4* 5*  CREATININE 0.64 0.71  CALCIUM 8.4 9.0   Liver Function Tests: Recent Labs  05/29/14 1305  AST 17  ALT 8  ALKPHOS 51  BILITOT 0.7  PROT 6.7  ALBUMIN 3.7   Cardiac Enzymes: Recent Labs  05/29/14 1306  05/30/14 2010 05/31/14 0133 05/31/14 0749  CKTOTAL 169  --   --   --   --   CKMB 7.5*  --   --   --   --   TROPONINI 0.52*  < > 12.46* 10.20* 14.67*  < > = values in this interval not displayed. No results found for this basename: TROPIPOC,  in the last 72 hours  Hemoglobin A1C: Recent Labs  05/30/14 0125  HGBA1C 5.7*   Fasting Lipid Panel: Recent Labs  05/30/14 0125  CHOL 107  HDL 23*  LDLCALC 58  TRIG [id number]  CHOLHDL 4.7   Thyroid Function Tests: Recent Labs  05/30/14 0125  TSH 2.210    TELE:  SR, PVCs, pairs, rare bigeminy, 1 4-bt run WC rhythm   Coronary Anatomy:  Dominance: Right Left Main: Moderately calcified, large caliber vessel that bifurcates into the LAD and Left Circumflex. Angiographically normal. LAD: Normal caliber vessel with mild to moderate proximal calcification. It  gives off a major first diagonal branch that has 40-50% proximal stenosis. The LAD itself is relatively free of disease and reaches down around the apex perfusing the distal one third of the apex. There is collateral flow noted to the PDA from the distal LAD and septal perforators.  D1: Moderate caliber vessel, with 45% proximal stenosis. It bifurcates shortly after that stenosis in the one smaller one major branch. Left Circumflex: Normal caliber, nondominant vessel, gives off OM1 and pocket to the group into OM 2 and the circumflex terminates as LP L1. There is diffuse calcification in the proximal segment, but no angiographically significant disease.  OM1: Small moderate caliber vessel with no significant disease.  OM 2: Moderate-large caliber vessel that bifurcates into 2 major branches the superior branch also bifurcates distally. It reaches almost to the inferolateral apex. Minimal luminal irregularities. .  RCA: Normal caliber, dominant vessel with mild  diffuse luminal irregularities. Distally before the crux there is roughly 40% stenosis in the vessel has an occluded and a small distal marginal branch prior to the bifurcation. Retrograde flow from the left coronary angiography revealed an occlusion would probably be just prior to bifurcation into Right Posterior Descending Artery (RPDA) and Right Posterior Groove Branch (RPAV).  Post angioplasty revealed a residual 80% stenosis at the takeoff of a small distal branch. There is moderate caliber PDA which results the apex and very tortuous fashion. The posterolateral system includes 2 small moderate caliber posterolateral branches in the AV nodal artery. Left Ventriculography:  EF: 55-60 %  Wall Motion: mild basal inferior hypokinesis with hyperdynamic anterior wall  After reviewing the initial angiography, the culprit lesion was thought to be 100% thrombotic occlusion of the distal RCA with TIMI 0 flow. Preparation were made to proceed with PCI on this lesion. POST-OPERATIVE DIAGNOSIS:  Severe single-vessel disease of the distal RCA with 100% thrombotic occlusion successfully treated with a single Promus Premier DES (2.5 mm x 12 mm postdilated to 2.8 mm)  Preserved LVEF with moderately elevated LVEDP.  Significant vasospasm of the radial artery, probably related to cigarette smoking.  Also noted was mild to moderate calcification of left coronary system.  Current Medications:  . aspirin  81 mg Oral Daily  . atorvastatin  80 mg Oral q1800  . fluticasone  2 spray Each Nare Daily  . metoprolol succinate  25 mg Oral Daily  . prasugrel  10 mg Oral Daily  . sodium chloride  3 mL Intravenous Q12H  . tiotropium  18 mcg Inhalation Daily      ASSESSMENT AND PLAN: Principal Problem:   ST elevation myocardial infarction (STEMI) of inferior wall, initial episode of care - s/p Promus Premier DES 2.5 mm x 12 mm to the RCA; on BB, ASA, Effient, statin.  Active Problems:   Moderate  severe COPD (chronic  obstructive pulmonary disease) - tolerating BB    Cigarette smoker - agrees to quit    CAD S/P percutaneous coronary angioplasty: Inf STEMI - 100% dRCA - PCI Promus P 2.5 mm x 12mm (2.29mm) - see above  Plan - d/c today.  Ryan Quitter , PA-C 8:47 AM 05/31/2014

## 2014-05-31 NOTE — Discharge Summary (Signed)
CARDIOLOGY DISCHARGE SUMMARY   Patient ID: Ryan SquiresMark A Vandermeulen MRN: [id number] DOB/AGE: [birth year] 52 y.o.  Admit date: 05/29/2014 Discharge date: 05/31/2014  PCP: Tillman Abideichard Letvak, MD Primary Cardiologist: New, [facility]  Primary Discharge Diagnosis:  ST elevation myocardial infarction (STEMI) of inferior wall, initial episode of care - PCI Promus 2.5 mm x 12mm (post-dilated to 2.378mm) to the RCA   Secondary Discharge Diagnosis:    Moderate  severe COPD (chronic obstructive pulmonary disease)   Cigarette smoker   CAD S/P percutaneous coronary angioplasty: Inf STEMI - 100% dRCA - PCI Promus P 2.5 mm x 12mm (2.738mm)  PROCEDURES PERFORMED:  Left Heart Catheterization with Native Coronary Angiography via Right Radial Artery  Left Ventriculography: Via Right Common Femoral Artery  Percutaneous Coronary Intervention of the Distal RCA 100% thrombotic occlusion: Promus Premier DES 2.5 mm x 12 mm (post dilated 2.8 mm) 2-D echocardiogram  Hospital Course: Ryan SquiresMark A Huhta is a 53 y.o. male with a history of CAD. He went to [facility] on the day of admission with persistent burning pressure in his chest. His ECG was borderline for STEMI but he was having reciprocal changes plus ongoing symptoms. A code STEMI was called and he was [facility] and taken directly to the cath lab.  Heart catheterization results are below. He had a drug-eluting stent to the RCA and tolerated the procedure well. His EF was 55-60%. He had some radial artery vasospasm, felt related to tobacco use. He was started on aspirin, a beta blocker, a statin and an antiplatelet agent. He was seen by cardiac rehabilitation and educated on MI restrictions, heart-healthy lifestyle modifications and exercise guidelines. He was counseled on smoking cessation and the patient was motivated to quit.  A 2-D echocardiogram was performed, results below. His EF is preserved. There were no significant  valvular abnormalities.  On 09/29, he was seen by Dr. Katrinka BlazingSmith and all data were reviewed. He was ambulating without chest pain or shortness of breath and no further inpatient workup is indicated. He was considered stable for discharge, to follow up as an outpatient.   Labs:   Lab Results  Component Value Date   WBC 8.7 05/30/2014   HGB 12.5* 05/30/2014   HCT 36.9* 05/30/2014   MCV 85.8 05/30/2014   PLT 270 05/30/2014     Recent Labs Lab 05/29/14 1305 05/30/14 0125  NA 131* 136*  K 3.5* 4.0  CL 93* 99  CO2 26 26  BUN 4* 5*  CREATININE 0.64 0.71  CALCIUM 8.4 9.0  PROT 6.7  --   BILITOT 0.7  --   ALKPHOS 51  --   ALT 8  --   AST 17  --   GLUCOSE 126* 108*    Recent Labs  05/30/14 2010 05/31/14 0133 05/31/14 0749  TROPONINI 12.46* 10.20* 14.67*   Lipid Panel     Component Value Date/Time   CHOL 107 05/30/2014 0125   TRIG 129 05/30/2014 0125   HDL 23* 05/30/2014 0125   CHOLHDL 4.7 05/30/2014 0125   VLDL 26 05/30/2014 0125   LDLCALC 58 05/30/2014 0125   No results found for this basename: INR,  in the last 72 hours Cardiac Cath: 05/29/2014 Coronary Anatomy:  Dominance: Right  Left Main: Moderately calcified, large caliber vessel that bifurcates into the LAD and Left Circumflex. Angiographically normal. LAD: Normal caliber vessel with mild to moderate proximal calcification. It gives off a major first diagonal branch that has  40-50% proximal stenosis. The LAD itself is relatively free of disease and reaches down around the apex perfusing the distal one third of the apex. There is collateral flow noted to the PDA from the distal LAD and septal perforators.  D1: Moderate caliber vessel, with 45% proximal stenosis. It bifurcates shortly after that stenosis in the one smaller one major branch. Left Circumflex: Normal caliber, nondominant vessel, gives off OM1 and pocket to the group into OM 2 and the circumflex terminates as LP L1. There is diffuse calcification in the proximal  segment, but no angiographically significant disease.  OM1: Small moderate caliber vessel with no significant disease.  OM 2: Moderate-large caliber vessel that bifurcates into 2 major branches the superior branch also bifurcates distally. It reaches almost to the inferolateral apex. Minimal luminal irregularities. .  RCA: Normal caliber, dominant vessel with mild diffuse luminal irregularities. Distally before the crux there is roughly 40% stenosis in the vessel has an occluded and a small distal marginal branch prior to the bifurcation. Retrograde flow from the left coronary angiography revealed an occlusion would probably be just prior to bifurcation into Right Posterior Descending Artery (RPDA) and Right Posterior Groove Branch (RPAV).  Post angioplasty revealed a residual 80% stenosis at the takeoff of a small distal branch. There is moderate caliber PDA which results the apex and very tortuous fashion. The posterolateral system includes 2 small moderate caliber posterolateral branches in the AV nodal artery. Left Ventriculography:  EF: 55-60 %  Wall Motion: mild basal inferior hypokinesis with hyperdynamic anterior wall  After reviewing the initial angiography, the culprit lesion was thought to be 100% thrombotic occlusion of the distal RCA with TIMI 0 flow. Preparation were made to proceed with PCI on this lesion.  POST-OPERATIVE DIAGNOSIS:  Severe single-vessel disease of the distal RCA with 100% thrombotic occlusion successfully treated with a single Promus Premier DES (2.5 mm x 12 mm postdilated to 2.8 mm)  Preserved LVEF with moderately elevated LVEDP.  Significant vasospasm of the radial artery, probably related to cigarette smoking.  Also noted was mild to moderate calcification of left coronary system.  EKG: 05/30/2014 Sinus rhythm, acute ischemic changes have improved Vent. rate 63 BPM PR interval 118 ms QRS duration 106 ms QT/QTc 436/446 ms P-R-T axes 68 93 85  Echo:  05/30/2014 Conclusions - Left ventricle: The cavity size was normal. Wall thickness was normal. Systolic function was normal. The estimated ejection fraction was in the range of 55% to 60%. There is mild hypokinesis of the basalinferior myocardium. Left ventricular diastolic function parameters were normal. - Atrial septum: No defect or patent foramen ovale was identified.   FOLLOW UP PLANS AND APPOINTMENTS Allergies  Allergen Reactions  . Codeine Sulfate Nausea And Vomiting     Medication List         acetaminophen 500 MG tablet  Commonly known as:  TYLENOL  Take 1,000 mg by mouth 2 (two) times daily as needed (pain).     aspirin 81 MG tablet  Take 1 tablet (81 mg total) by mouth daily.     metoprolol succinate 25 MG 24 hr tablet  Commonly known as:  TOPROL XL  Take 1 tablet (25 mg total) by mouth daily.     multivitamin with minerals Tabs tablet  Take 1 tablet by mouth daily.     nitroGLYCERIN 0.4 MG SL tablet  Commonly known as:  NITROSTAT  Place 1 tablet (0.4 mg total) under the tongue every 5 (five) minutes as needed for  chest pain.     prasugrel 10 MG Tabs tablet  Commonly known as:  EFFIENT  Take 1 tablet (10 mg total) by mouth daily.     PROAIR HFA 108 (90 BASE) MCG/ACT inhaler  Generic drug:  albuterol  Inhale 2 puffs into the lungs daily as needed for wheezing or shortness of breath.     tiotropium 18 MCG inhalation capsule  Commonly known as:  SPIRIVA  Place 18 mcg into inhaler and inhale at bedtime.        Discharge Instructions   Amb Referral to Cardiac Rehabilitation    Complete by:  As directed   Referring to [place] Phase 2     Diet - low sodium heart healthy    Complete by:  As directed      Increase activity slowly    Complete by:  As directed           Follow-up Information   Follow up with DUNN,RYAN, PA-C On 06/09/2014. (Appointment at 1:15 PM, please arrive 15 minutes for paperwork.)    Specialty:  Physician Assistant   Contact  information:   [address] [place] [address] [phone]       BRING ALL MEDICATIONS WITH YOU TO FOLLOW UP APPOINTMENTS  Time spent with patient to include physician time: 41 min Signed: Theodore Demark, PA-C 06/02/2014, 2:41 PM Co-Sign MD

## 2014-05-31 NOTE — Discharge Instructions (Signed)
PLEASE REMEMBER TO BRING ALL OF YOUR MEDICATIONS TO EACH OF YOUR FOLLOW-UP OFFICE VISITS.  PLEASE ATTEND ALL SCHEDULED FOLLOW-UP APPOINTMENTS.   Activity: Increase activity slowly as tolerated. You may shower, but no soaking baths (or swimming) for 1 week. No driving for 1 week. No lifting over 5 lbs for 2 weeks. No sexual activity for 1 week.   You May Return to Work: in 3 weeks (if applicable)  Wound Care: You may wash cath site gently with soap and water. Keep cath site clean and dry. If you notice pain, swelling, bleeding or pus at your cath site, please call [phone].    Cardiac Cath Site Care Refer to this sheet in the next few weeks. These instructions provide you with information on caring for yourself after your procedure. Your caregiver may also give you more specific instructions. Your treatment has been planned according to current medical practices, but problems sometimes occur. Call your caregiver if you have any problems or questions after your procedure. HOME CARE INSTRUCTIONS  You may shower 24 hours after the procedure. Remove the bandage (dressing) and gently wash the site with plain soap and water. Gently pat the site dry.   Do not apply powder or lotion to the site.   Do not sit in a bathtub, swimming pool, or whirlpool for 5 to 7 days.   No bending, squatting, or lifting anything over 10 pounds (4.5 kg) as directed by your caregiver.   Inspect the site at least twice daily.   Do not drive home if you are discharged the same day of the procedure. Have someone else drive you.   You may drive 24 hours after the procedure unless otherwise instructed by your caregiver.  What to expect:  Any bruising will usually fade within 1 to 2 weeks.   Blood that collects in the tissue (hematoma) may be painful to the touch. It should usually decrease in size and tenderness within 1 to 2 weeks.  SEEK IMMEDIATE MEDICAL CARE IF:  You have unusual pain at the site or down the  affected limb.   You have redness, warmth, swelling, or pain at the site.   You have drainage (other than a small amount of blood on the dressing).   You have chills.   You have a fever or persistent symptoms for more than 72 hours.   You have a fever and your symptoms suddenly get worse.   Your leg becomes pale, cool, tingly, or numb.   You have heavy bleeding from the site. Hold pressure on the site.  Document Released: 09/21/2010 Document Revised: 08/08/2011 Document Reviewed:   Coronary Angiogram With Stent, Care After Refer to this sheet in the next few weeks. These instructions provide you with information on caring for yourself after your procedure. Your health care provider may also give you more specific instructions. Your treatment has been planned according to current medical practices, but problems sometimes occur. Call your health care provider if you have any problems or questions after your procedure.  WHAT TO EXPECT AFTER THE PROCEDURE  The insertion site may be tender for a few days after your procedure. HOME CARE INSTRUCTIONS   Take medicines only as directed by your health care provider. Blood thinners may be prescribed after your procedure to improve blood flow through the stent.  Change any bandages (dressings) as directed by your health care provider.   Check your insertion site every day for redness, swelling, or fluid leaking from the insertion.  Do not take baths, swim, or use a hot tub until your health care provider approves. You may shower. Pat the insertion area dry. Do not rub the insertion area with a washcloth or towel.   Eat a heart-healthy diet. This should include plenty of fresh fruits and vegetables. Meat should be lean cuts. Avoid the following types of food:   Food that is high in salt.   Canned or highly processed food.   Food that is high in saturated fat or sugar.   Fried food.   Make any other lifestyle changes recommended  by your health care provider. This may include:   Not using any tobacco products including cigarettes, chewing tobacco, or electronic cigarettes.  Managing your weight.   Getting regular exercise.   Managing your blood pressure.   Limiting your alcohol intake.   Managing other health problems, such as diabetes.   If you need an MRI after your heart stent was placed, be sure to tell the health care provider who orders the MRI that you have a heart stent.   Keep all follow-up visits as directed by your health care provider.  SEEK IMMEDIATE MEDICAL CARE IF:   You develop chest pain, shortness of breath, feel faint, or pass out.  You have bleeding, swelling larger than a walnut, or drainage from the catheter insertion site.  You develop pain, discoloration, coldness, or severe bruising in the leg or arm that held the catheter.  You develop bleeding from any other place such as from the bowels. There may be bright red blood in the urine or stools, or it may appear as black, tarry stools.  You have a fever or chills. MAKE SURE YOU:  Understand these instructions.  Will watch your condition.  Will get help right away if you are not doing well or get worse. Document Released: 03/08/2005 Document Revised: 01/03/2014 Document Reviewed: 01/20/2013 [facility] Patient Information 2015 [employer], [place]. This information is not intended to replace advice given to you by your health care provider. Make sure you discuss any questions you have with your health care provider.

## 2014-05-31 NOTE — Telephone Encounter (Signed)
New message      Pt had a cath Sunday----he came home today---can he take a shower?  The site at his wrist and groin is heavily bandaged.

## 2014-05-31 NOTE — Plan of Care (Signed)
Problem: Phase III Progression Outcomes Goal: ACE I or ARB if EF < 40% Outcome: Not Applicable Date Met:  48/84/57 EF 55 - 60 per Echo on 9/28

## 2014-05-31 NOTE — Progress Notes (Signed)
Patient examined. Femoral and radial cath sites are unremarkable. He denies chest pain and has completed teaching from cardiac rehab service. Note contains discharge and clinical data that has been edited and is correct. The patient is ready for discharge.

## 2014-06-01 NOTE — Telephone Encounter (Signed)
Spoke to wife. She states the patient did shower but left the bandages on the wrist and groin.  RN INFORMED WIFE IT WILL BE OKAY TO REMOVE BANDAGES AND SHOWER.  WIFE STATES COMPLAINING OF LOWER BACK ACHING. IT FELT BETTER AFTER WALKING AND WIFE RUBBING IT. RN INFORM WIFE TO CONTINUE TO MONITOR IF CHANGE I.E. SHARP PAIN IN AREA, BRUISINGNOTED CONTACT OFFICE PATIENT HAS APPOINTMENT 10/815. WIFE VERBALIZED UNDERSTANDING.

## 2014-06-02 ENCOUNTER — Encounter: Payer: Self-pay | Admitting: Physician Assistant

## 2014-06-02 NOTE — Discharge Summary (Signed)
I did not see the patient. Was seen by my partner prior to discharge. Marykay LexHARDING,DAVID W, M.D., M.S. Interventional Cardiologist   Pager # [phone]

## 2014-06-02 NOTE — Telephone Encounter (Signed)
[facility].  Patient understands to follow up with provider Eula Listenyan Dunn PA on 06/09/14 at 1:15pm [facility]. Patient understands discharge instructions? yes Patient understands medications and regiment? yes Patient understands to bring all medications to this visit? yes

## 2014-06-04 ENCOUNTER — Emergency Department (HOSPITAL_COMMUNITY)
Admission: EM | Admit: 2014-06-04 | Discharge: 2014-06-04 | Disposition: A | Discharge status: Home / Self Care | Payer: No Typology Code available for payment source | Attending: Emergency Medicine | Admitting: Emergency Medicine

## 2014-06-04 ENCOUNTER — Encounter (HOSPITAL_COMMUNITY): Payer: Self-pay | Admitting: Emergency Medicine

## 2014-06-04 ENCOUNTER — Telehealth: Payer: Self-pay | Admitting: Cardiology

## 2014-06-04 DIAGNOSIS — Z79899 Other long term (current) drug therapy: Secondary | ICD-10-CM | POA: Diagnosis not present

## 2014-06-04 DIAGNOSIS — Z7982 Long term (current) use of aspirin: Secondary | ICD-10-CM | POA: Diagnosis not present

## 2014-06-04 DIAGNOSIS — I2510 Atherosclerotic heart disease of native coronary artery without angina pectoris: Secondary | ICD-10-CM | POA: Insufficient documentation

## 2014-06-04 DIAGNOSIS — I82611 Acute embolism and thrombosis of superficial veins of right upper extremity: Secondary | ICD-10-CM | POA: Diagnosis not present

## 2014-06-04 DIAGNOSIS — M7989 Other specified soft tissue disorders: Secondary | ICD-10-CM | POA: Insufficient documentation

## 2014-06-04 DIAGNOSIS — R609 Edema, unspecified: Secondary | ICD-10-CM

## 2014-06-04 DIAGNOSIS — I252 Old myocardial infarction: Secondary | ICD-10-CM | POA: Insufficient documentation

## 2014-06-04 DIAGNOSIS — J449 Chronic obstructive pulmonary disease, unspecified: Secondary | ICD-10-CM | POA: Diagnosis not present

## 2014-06-04 DIAGNOSIS — Z720 Tobacco use: Secondary | ICD-10-CM | POA: Insufficient documentation

## 2014-06-04 DIAGNOSIS — Z9861 Coronary angioplasty status: Secondary | ICD-10-CM | POA: Diagnosis not present

## 2014-06-04 DIAGNOSIS — M79609 Pain in unspecified limb: Secondary | ICD-10-CM

## 2014-06-04 NOTE — Discharge Instructions (Signed)
Patient with vascular studies showing a superficial vein thrombosis at the right wrist. No evidence of any artery injury no pseudoaneurysm no fistula. Patient is already on aspirin a day and on  platelet inhibitor. We'll have him continue his medications. Followup with his cardiologist as scheduled for the this week coming up. Patient will return for any development of redness or worsening symptoms.

## 2014-06-04 NOTE — ED Notes (Signed)
Pt c/o swelling to cath site on right wrist/forearm area x 1 hour. Pt had cath done 05/29/2014. Pt denies recent injury.

## 2014-06-04 NOTE — Telephone Encounter (Signed)
Pt's wife Pam called. Pt developed sudden swelling "the size of a golf ball" at his Rt wrist radial site where PCI was attempted 05/29/14. Instructed to come to the ER for further evaluation.   Corine ShelterLUKE Hudsyn Barich PA-C 06/04/2014 12:24 PM

## 2014-06-04 NOTE — ED Notes (Signed)
Per Kriste BasqueBecky, Diplomatic Services operational officersecretary vascular has been paged and will do u/s.

## 2014-06-04 NOTE — Progress Notes (Signed)
VASCULAR LAB PRELIMINARY  PRELIMINARY  PRELIMINARY  PRELIMINARY  Right forearm ultrasound completed.    Preliminary report:  Right superficial thrombosis in a short segment of the cephalic vein at the wrist.  No DVT noted in the forearm.  No AV fistula or pseudoaneurysm noted.    Novalynn Branaman, RVT 06/04/2014, 3:40 PM

## 2014-06-04 NOTE — ED Provider Notes (Signed)
CSN: [id number]     Arrival date & time 06/04/14  1313 History   First MD Initiated Contact with Patient 06/04/14 1328     Chief Complaint  Patient presents with  . Arm Swelling     (Consider location/radiation/quality/duration/timing/severity/associated sxs/prior Treatment) HPI Comments: [age] year old male with recent hospitalization for ST elevation myocardial infarction, smoker, COPD presents with swelling and mild tenderness to right distal forearm at cath site where they attempted to enter the radial artery on 9/27. The swelling just started to 2 hours prior to arrival. No blood clot history. Patient has been taking his blood thinners as directed Patient denies fevers chills or vomiting. Patient denies chest pain or shortness of breath.  The history is provided by the patient.    Past Medical History  Diagnosis Date  . COPD (chronic obstructive pulmonary disease)      PFTs 2013.. Mod severe COPD...   . Emphysema   . ST elevation myocardial infarction (STEMI) of inferior wall, initial episode of care 05/29/2014  . CAD S/P percutaneous coronary angioplasty: Inf STEMI - 100% dRCA - PCI Promus P 2.5 mm x 12mm (2.188mm) 05/29/2014   Past Surgical History  Procedure Laterality Date  . Inguinal hernia repair      twice on right and once on left-- 1980's-90's  . Right shoulder surgery  1987  . Fracture left humerus  2009    no surgery. Torn rotator cuff also   Family History  Problem Relation Age of Onset  . Hypertension Mother   . Diabetes Mother   . Heart disease Mother     stent  . Cancer Father     prostate  . Hypertension Father   . Gout Father   . Heart disease Father     Stent  . Colon cancer Neg Hx   . Stomach cancer Neg Hx    History  Substance Use Topics  . Smoking status: Current Every Day Smoker -- 1.00 packs/day    Types: Cigarettes    Last Attempt to Quit: 05/29/2014  . Smokeless tobacco: Never Used     Comment: smokes 1/2 PPD  . Alcohol Use: No    Review  of Systems  Constitutional: Negative for fever and chills.  Respiratory: Negative for shortness of breath.   Cardiovascular: Negative for chest pain and leg swelling.  Musculoskeletal: Negative for back pain.  Skin: Positive for wound.  Neurological: Negative for light-headedness and headaches.      Allergies  Codeine sulfate  Home Medications   Prior to Admission medications   Medication Sig Start Date End Date Taking? Authorizing Provider  acetaminophen (TYLENOL) 500 MG tablet Take 1,000 mg by mouth 2 (two) times daily as needed (pain).   Yes Historical Provider, MD  aspirin 81 MG tablet Take 1 tablet (81 mg total) by mouth daily. 05/31/14  Yes Rhonda G Barrett, PA-C  metoprolol succinate (TOPROL XL) 25 MG 24 hr tablet Take 1 tablet (25 mg total) by mouth daily. 05/31/14  Yes Rhonda G Barrett, PA-C  prasugrel (EFFIENT) 10 MG TABS tablet Take 1 tablet (10 mg total) by mouth daily. 05/31/14  Yes Rhonda G Barrett, PA-C  tiotropium (SPIRIVA) 18 MCG inhalation capsule Place 18 mcg into inhaler and inhale at bedtime.   Yes Historical Provider, MD  albuterol (PROAIR HFA) 108 (90 BASE) MCG/ACT inhaler Inhale 2 puffs into the lungs daily as needed for wheezing or shortness of breath.    Historical Provider, MD  nitroGLYCERIN (NITROSTAT) 0.4 MG SL tablet  Place 1 tablet (0.4 mg total) under the tongue every 5 (five) minutes as needed for chest pain. 05/31/14   Rhonda G Barrett, PA-C   BP 134/80  Pulse 63  Temp(Src) 98.8 F (37.1 C) (Oral)  Resp 14  Ht 5\' 10"  (1.778 m)  Wt 140 lb (63.504 kg)  BMI 20.09 kg/m2  SpO2 100% Physical Exam  Nursing note and vitals reviewed. Constitutional: He is oriented to person, place, and time. He appears well-developed and well-nourished.  HENT:  Head: Normocephalic and atraumatic.  Eyes: Right eye exhibits no discharge. Left eye exhibits no discharge.  Neck: Normal range of motion. Neck supple. No tracheal deviation present.  Cardiovascular: Normal rate.    Pulmonary/Chest: Effort normal.  Abdominal: Soft.  Musculoskeletal: He exhibits edema.  Neurological: He is alert and oriented to person, place, and time.  Skin: Skin is warm. No rash noted. No erythema.  Patient has mild swelling and mild tenderness palmar aspect of distal right forearm. No warmth, induration or sign of acute infection. Difficulty with radial pulse due to swelling however Doppler. Normal cap refill bilateral hands less than 2 seconds. Normal ulnar pulse bilateral.  Psychiatric: He has a normal mood and affect.    ED Course  Procedures (including critical care time) Labs Review Labs Reviewed - No data to display  Imaging Review No results found.   EKG Interpretation None      MDM   Final diagnoses:  None   Patient new onset swelling at site where they entered to attempt to perform a cardiac cath for his recent heart attack, no sign of acute infection, mild swelling. Ultrasound ordered to check for blood clot and to look for signs of rare fistula. No bruit on exam, normal cap refill. Patient comfortable with the plan.  Ultrasound pending, patient will be signed out to followup ultrasound results.  Right arm swelling  Enid Skeens, MD 06/04/14 1700

## 2014-06-09 ENCOUNTER — Telehealth: Payer: Self-pay | Admitting: Physician Assistant

## 2014-06-09 ENCOUNTER — Ambulatory Visit (INDEPENDENT_AMBULATORY_CARE_PROVIDER_SITE_OTHER): Payer: No Typology Code available for payment source | Admitting: Physician Assistant

## 2014-06-09 ENCOUNTER — Encounter: Payer: Self-pay | Admitting: Physician Assistant

## 2014-06-09 VITALS — BP 138/80 | HR 64 | Ht 71.0 in | Wt 144.2 lb

## 2014-06-09 DIAGNOSIS — I2510 Atherosclerotic heart disease of native coronary artery without angina pectoris: Secondary | ICD-10-CM

## 2014-06-09 DIAGNOSIS — R9431 Abnormal electrocardiogram [ECG] [EKG]: Secondary | ICD-10-CM

## 2014-06-09 DIAGNOSIS — Z87891 Personal history of nicotine dependence: Secondary | ICD-10-CM

## 2014-06-09 DIAGNOSIS — Z23 Encounter for immunization: Secondary | ICD-10-CM

## 2014-06-09 DIAGNOSIS — Z9861 Coronary angioplasty status: Secondary | ICD-10-CM

## 2014-06-09 DIAGNOSIS — I2119 ST elevation (STEMI) myocardial infarction involving other coronary artery of inferior wall: Secondary | ICD-10-CM

## 2014-06-09 MED ORDER — ATORVASTATIN CALCIUM 20 MG PO TABS: 20.0000 mg | ORAL_TABLET | Freq: Every day | ORAL | Status: DC

## 2014-06-09 NOTE — Telephone Encounter (Signed)
Pt was returning a [facility] call [facility], saying that she had some paper work that was ready for them to pick up. Pt asked if we could just mail it to them.

## 2014-06-09 NOTE — Progress Notes (Signed)
Patient Name: Ryan Cantrell, Ryan Cantrell 07/31/1961, MRN [id number]  Date of Encounter: 06/09/2014  Primary Care Provider:  Tillman Abide, MD Primary Cardiologist:  Dr. Herbie Baltimore, MD  Patient Profile:  53 y.o. male with history of CAD s/p subtle inferior ST elevation MI 05/29/2014 with PCI/DES to [facility], COPD, ongoing tobacco abuse who is here for hospital follow up after recent admission to [facility] from 9/27-9/29 for the above MI.    Problem List:   Past Medical History  Diagnosis Date  . COPD (chronic obstructive pulmonary disease)      PFTs 2013.. Mod severe COPD...   . Emphysema   . ST elevation myocardial infarction (STEMI) of inferior wall, initial episode of care 05/29/2014  . CAD S/P percutaneous coronary angioplasty: Inf STEMI - 100% dRCA - PCI Promus P 2.5 mm x 12mm (2.52mm) 05/29/2014    a. MI 05/29/14 dRCA   Past Surgical History  Procedure Laterality Date  . Inguinal hernia repair      twice on right and once on left-- 1980's-90's  . Right shoulder surgery  1987  . Fracture left humerus  2009    no surgery. Torn rotator cuff also  . Cardiac catheterization  05/29/2014    a. MI 05/29/14 dRCA  . Coronary angioplasty       Allergies:  Allergies  Allergen Reactions  . Codeine Sulfate Nausea And Vomiting     HPI:  53 y.o. male with the above problem list who is here for hospital follow up from his recent hospital admission to [facility] for subtle inferior ST elevation MI on 9/27 s/p PCI/DES to dRCA with Promus Premier DES 2.5 mm x 12 mm.   Patient with history of COPD/emphysema and long-term smoking who had noticed intermittent episodes of chest discomfort and 5 minutes of heartburn and pressure sensation in his chest over the last week prior to his presentation to the hospital. Episodes were becoming more severe, associated with diaphoresis and SOB.   He initially presented to [facility] (closest ER) on 9/27 with persistent/burning/pressure in his chest radiating out to his arm. He had  been noting intermittent episodes lasting 3-5 minutes of similar type symptoms but not radiating to his arm and not lasting as long. Pain was 8/10 upon arrival to [facility].   Astute clinical decision-making by the ER MD (Dr. Cyril Loosen) lead to: Code STEMI. (TnI 12.46--10.20--12.67). Subtle inferior ST elevations in leads 2, 3 and aVF as well as depressions in 1 aVL and V5 and V6. These elevations were maybe 1 mm at best but still subtle with artifact. Based on his presenting symptoms, in discussion with Dr. Cyril Loosen, the best course of action was to activate Code STEMI. He was given 325 mg aspirin and 4000 Units IV Heparin. He was transferred directly to [facility] to the ER to the cardiac catheterization lab.   Cath showed occluded dRCA which underwent PCI/DES. Recommendations were to continue DAPT (Effient 10 mg and aspirin 81 mg) x 1 year, smoking cessation, & start low dose b-blocker and statin. 2-D echo showed preserved EF and no significant valvular abnormalities, EF 55-60%, mild HK of basalinferior myocardium. His A1C was found to be elevated at 6.0%.   He called the answering service on 10/3 with swelling at the right radial cath site and was advised to go to the ED. Upon his evaluation at the ED he underwent an [place] which showed No evidence of deep vein thrombosis involving the right radial Vein. Superficial thrombus in the cephalic vein  at the wrist. No evidence of AV fistula or pseudoaneurysm.  He comes in today stating that he feels well. He continues to advance his activity as tolerated. He did have one episode of short-lived chest discomfort related to a stress full situation a few days ago, feels like this was anxiety as when he calmed down the pain resolved. He has not had any further issues. He has not had any further issues with his right radial cath site. Right femoral cath site is healing well. He has quit smoking as of 05/29/14. He is working to improve his diet. He is interested in [facility].       Home  Medications:  Prior to Admission medications   Medication Sig Start Date End Date Taking? Authorizing Provider  acetaminophen (TYLENOL) 500 MG tablet Take 1,000 mg by mouth 2 (two) times daily as needed (pain).    Historical Provider, MD  albuterol (PROAIR HFA) 108 (90 BASE) MCG/ACT inhaler Inhale 2 puffs into the lungs daily as needed for wheezing or shortness of breath.    Historical Provider, MD  aspirin 81 MG tablet Take 1 tablet (81 mg total) by mouth daily. 05/31/14   Rhonda G Barrett, PA-C  metoprolol succinate (TOPROL XL) 25 MG 24 hr tablet Take 1 tablet (25 mg total) by mouth daily. 05/31/14   Rhonda G Barrett, PA-C  nitroGLYCERIN (NITROSTAT) 0.4 MG SL tablet Place 1 tablet (0.4 mg total) under the tongue every 5 (five) minutes as needed for chest pain. 05/31/14   Rhonda G Barrett, PA-C  prasugrel (EFFIENT) 10 MG TABS tablet Take 1 tablet (10 mg total) by mouth daily. 05/31/14   Rhonda G Barrett, PA-C  tiotropium (SPIRIVA) 18 MCG inhalation capsule Place 18 mcg into inhaler and inhale at bedtime.    Historical Provider, MD     Weights: Filed Weights   06/09/14 1315  Weight: 144 lb 4 oz (65.431 kg)     Review of Systems:  All other systems reviewed and are otherwise negative except as noted above.  Physical Exam:  Blood pressure 138/80, pulse 64, height 5\' 11"  (1.803 m), weight 144 lb 4 oz (65.431 kg).  General: Pleasant, NAD Psych: Normal affect. Neuro: Alert and oriented X 3. Moves all extremities spontaneously. HEENT: Normal  Neck: Supple without bruits or JVD. Lungs:  Resp regular and unlabored, CTA. Heart: RRR no s3, s4, or murmurs. Abdomen: Soft, non-tender, non-distended, BS + x 4.  Extremities: No clubbing, cyanosis or edema. DP/PT/Radials 2+ and equal bilaterally. Both right radial and right femoral cath sites are well healed without bruits, hematoma, bleeding, or TTP.    Accessory Clinical Findings:  EKG - NSR, 64, right axis, inferior q's, poor R wave  progression  Assessment & Plan:  1. History of subtle inferior ST elevation MI 05/29/2014: -S/p PCI/DES to dRCA at [facility]  -Continue DAPT (Effient 10 mg daily and aspirin 81 mg daily) for at least 12 months -[facility] -Toprol-XL 25 mg daily -Lipitor 20 mg daily  2. CAD: -S/p inferior MI 05/29/14 as above with PCI/DES to dRCA -Continue DAPT (Effient 10 mg daily and aspirin 81 mg daily) for at least 12 months -Continue Toprol-XL 25 mg daily -LDL is at goal, currently 58 - will start Lipitor 20 mg daily for primary prevention  -Lifestyle changes  3. 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 Hyperglycemia: -Follow up PCP  4. Tobacco abuse: -Kudos for quitting   Eula Listenyan Taurean Ju, PA-C [facility] [address] [place], [place] [address] [phone] [facility] 06/09/2014, 5:32 PM

## 2014-06-09 NOTE — Patient Instructions (Addendum)
Please start Lipitor 20mg  once daily  We will give you a flu shot today  Please follow up with Dr. Herbie BaltimoreHarding in 2 weeks

## 2014-06-09 NOTE — Telephone Encounter (Signed)
Completed short term disability form mailed to pt's home address.

## 2014-06-10 ENCOUNTER — Telehealth: Payer: Self-pay | Admitting: Cardiology

## 2014-06-10 NOTE — Telephone Encounter (Signed)
Received from Ermalene SearingS Martin RN today a [employer] Dis[employer] form for patient.  Forwarded to [employer] [facility] [place] for packet/letter to be mailed for instructions for patient of paperwork we needed.

## 2014-06-22 ENCOUNTER — Encounter: Payer: Self-pay | Admitting: Cardiology

## 2014-06-22 ENCOUNTER — Encounter: Payer: Self-pay | Admitting: *Deleted

## 2014-06-22 ENCOUNTER — Ambulatory Visit (INDEPENDENT_AMBULATORY_CARE_PROVIDER_SITE_OTHER): Payer: No Typology Code available for payment source | Admitting: Cardiology

## 2014-06-22 VITALS — BP 138/83 | HR 61 | Ht 71.0 in | Wt 145.8 lb

## 2014-06-22 DIAGNOSIS — F17210 Nicotine dependence, cigarettes, uncomplicated: Secondary | ICD-10-CM

## 2014-06-22 DIAGNOSIS — I2510 Atherosclerotic heart disease of native coronary artery without angina pectoris: Secondary | ICD-10-CM

## 2014-06-22 DIAGNOSIS — R030 Elevated blood-pressure reading, without diagnosis of hypertension: Secondary | ICD-10-CM

## 2014-06-22 DIAGNOSIS — E786 Lipoprotein deficiency: Secondary | ICD-10-CM

## 2014-06-22 DIAGNOSIS — I2119 ST elevation (STEMI) myocardial infarction involving other coronary artery of inferior wall: Secondary | ICD-10-CM

## 2014-06-22 DIAGNOSIS — Z720 Tobacco use: Secondary | ICD-10-CM

## 2014-06-22 DIAGNOSIS — Z9861 Coronary angioplasty status: Secondary | ICD-10-CM

## 2014-06-22 MED ORDER — PRASUGREL HCL 10 MG PO TABS: 10.0000 mg | ORAL_TABLET | Freq: Every day | ORAL | Status: DC

## 2014-06-22 NOTE — Patient Instructions (Signed)
Your Effient has been refilled   Your physician wants you to follow-up in: 4-6 months. You will receive a reminder letter in the mail two months in advance. If you don't receive a letter, please call our office to schedule the follow-up appointment.  Your next appointment will be scheduled in our new office located at :  [facility]  [address], [address]  [place], [place] [address]

## 2014-06-23 ENCOUNTER — Telehealth: Payer: Self-pay

## 2014-06-23 NOTE — Telephone Encounter (Signed)
Fax Dr. Erich MontaneHardings office note to [phone] when ready  Per patient request

## 2014-06-23 NOTE — Telephone Encounter (Signed)
LVM 10/22 

## 2014-06-23 NOTE — Telephone Encounter (Signed)
Pt wife called and states she has some questions regarding pt visit yesterday. Please call.

## 2014-06-24 ENCOUNTER — Encounter: Payer: Self-pay | Admitting: Cardiology

## 2014-06-24 ENCOUNTER — Telehealth: Payer: Self-pay | Admitting: *Deleted

## 2014-06-24 DIAGNOSIS — R030 Elevated blood-pressure reading, without diagnosis of hypertension: Secondary | ICD-10-CM | POA: Insufficient documentation

## 2014-06-24 DIAGNOSIS — I10 Essential (primary) hypertension: Secondary | ICD-10-CM | POA: Insufficient documentation

## 2014-06-24 NOTE — Assessment & Plan Note (Signed)
I congratulated him on his efforts. He is not 3 weeks out. He has not required medication assistance, but is having increased appetite issues. Smoking cessation instruction/counseling given:  commended patient for quitting and reviewed strategies for preventing relapses

## 2014-06-24 NOTE — Assessment & Plan Note (Addendum)
I strongly encouraged him to at least go to whatever amounts of cardiac rehabilitation but he can do for this to work. He does do some psychosocial issues but also in order to understand how best to get back into his normal activity routine.  Post MI, my policy for her returning to work when work is vigorous and stressful is 6 weeks. This is the time when the heart had essentially fully healed and he should be safe to go back to full activity. He tells me that his work is "all or none", and in this case I don't think he would be a great idea for him to return to work after simply 4 weeks. I provided him a letter stating that his return to work will be November 9.  Thankfully, this will allow him to have me be 3-4 sessions of cardiac rehabilitation prior to return to work

## 2014-06-24 NOTE — Telephone Encounter (Signed)
See 06/23/14 phone note

## 2014-06-24 NOTE — Assessment & Plan Note (Signed)
Currently on atorvastatin. May need to consider prescription omega-3 fatty acids. Unfortunately there are not really good medical options for raising HDL. Can consider niacin, but really exercises the only true way to increase HDL levels.

## 2014-06-24 NOTE — Progress Notes (Signed)
PCP: Tillman Abideichard Letvak, MD  Clinic Note: Chief Complaint  Patient presents with  . other    2 wk f/u no complaints. Meds reviewed verbally with pt.    HPI: Ryan Cantrell is a 53 y.o. male with a PMH below who presents today for his second post hospital followup from his inferior STEMI.  He was transferred from [facility] emergency room on September 27 after presenting there with persistent chest burning and pressure radiating to the left arm. This was the pulmonary symptoms of several weeks of intermittent short-lived chest discomfort episodes maybe 3-5 minutes in duration it did not radiate to the left arm. His EKG changes were relatively subtle, however the EDP (Dr. Cyril LoosenKinner) was concerned about the symptoms and the abnormal EKG to call code STEMI. After initially being turned down [facility], he called [facility], and the patient was emergently transferred, taken to the Cath Lab and found to have an occluded RCA -- Treated with a DES stent. He was discharged according to the fast track protocol. He has not smoked since his MI. He can to the clinic and was seen by Eula Listenyan Dunn, PA for acute swelling and hematoma in the right radial access site. (Radial access was aborted during STEMI do to severe pain with catheter advancement. He was found to have an ulnar vein thrombosis treated with warm compresses. He no longer has discomfort and has no pain in his hand. The bruising is almost gone.   Past Medical History  Diagnosis Date  . COPD (chronic obstructive pulmonary disease)      PFTs 2013.. Mod severe COPD...   . Emphysema   . ST elevation myocardial infarction (STEMI) of inferior wall, initial episode of care 05/29/2014  . CAD S/P percutaneous coronary angioplasty: Inf STEMI - 100% dRCA - PCI Promus P 2.5 mm x 12mm (2.828mm) 05/29/2014    a. MI 05/29/14 dRCA PCI - Promus Premier DES 2.5 mm x 12 mm (2.8 mm); b. Echo: EF 55-60%, mild basal-inferior HK, normal DD   Interval History: He  presents todayReally no major complaints. He is very thankful that his condition was actually diagnosed and treated. He has not had any further episodes of his anginal chest pain with rest or exertion. No dyspnea with exertion. He is not really doing much exertion since he is close to his infarct. T. is asking about returning back to work which is very stressful work environment. My initial examination and was due to the extent of his work it should be 6 weeks. He would very much like to go to cardiac rehabilitation, but does not think it is work will allow him to. He does not have orientation until October 27. He has no PND, orthopnea or edema. No palpitations psych heartbeats or irregular rhythms. No syncope or near syncope, TIA or amaurosis fugax. No melena, hematochezia, hematuria, epistaxis. No claudication.  ROS: A comprehensive was performed. Review of Systems  HENT: Negative for congestion and nosebleeds.   Respiratory: Positive for cough. Negative for hemoptysis, sputum production, shortness of breath and wheezing.   Cardiovascular: Negative for claudication.  Gastrointestinal: Positive for heartburn. Negative for blood in stool and melena.  Genitourinary: Negative for hematuria.  Musculoskeletal:       R arm - swelling all gone, minimal bruise, no more pain  Neurological: Negative for dizziness, sensory change, speech change, focal weakness, seizures and loss of consciousness.  Endo/Heme/Allergies: Does not bruise/bleed easily.  Psychiatric/Behavioral: Negative for depression. The patient  is not nervous/anxious.   All other systems reviewed and are negative.   Current Outpatient Prescriptions on File Prior to Visit  Medication Sig Dispense Refill  . acetaminophen (TYLENOL) 500 MG tablet Take 1,000 mg by mouth 2 (two) times daily as needed (pain).      Marland Kitchen. albuterol (PROAIR HFA) 108 (90 BASE) MCG/ACT inhaler Inhale 2 puffs into the lungs daily as needed for wheezing or shortness of  breath.      Marland Kitchen. aspirin 81 MG tablet Take 1 tablet (81 mg total) by mouth daily.      Marland Kitchen. atorvastatin (LIPITOR) 20 MG tablet Take 1 tablet (20 mg total) by mouth daily.  30 tablet  6  . metoprolol succinate (TOPROL XL) 25 MG 24 hr tablet Take 1 tablet (25 mg total) by mouth daily.  30 tablet  11  . nitroGLYCERIN (NITROSTAT) 0.4 MG SL tablet Place 1 tablet (0.4 mg total) under the tongue every 5 (five) minutes as needed for chest pain.  25 tablet  3  . tiotropium (SPIRIVA) 18 MCG inhalation capsule Place 18 mcg into inhaler and inhale at bedtime.       No current facility-administered medications on file prior to visit.   ALLERGIES REVIEWED IN EPIC -- No change  History   Social History Narrative   Married - 1 child.   Smokes 1/2 ppd.   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    FAMILY HISTORY REVIEWED IN EPIC -- No change  Wt Readings from Last 3 Encounters:  06/22/14 145 lb 12 oz (66.112 kg)  06/09/14 144 lb 4 oz (65.431 kg)  06/04/14 140 lb (63.504 kg)   PHYSICAL EXAM BP 138/83  Pulse 61  Ht 5\' 11"  (1.803 m)  Wt 145 lb 12 oz (66.112 kg)  BMI 20.34 kg/m2 General appearance: alert, cooperative, appears stated age, no distress and well nourished Neck: no adenopathy, no carotid bruit and no JVD Lungs: CTAB, normal percussion bilaterally and non-labored Heart: RRR, S1& S2 normal, no murmur, click, rub or gallop Abdomen: soft, non-tender; bowel sounds normal; no masses,  no organomegaly;  Extremities: extremities normal, atraumatic, no cyanosis,or  edema (clearing bruise on R forearm, no more swelling) Pulses: 2+ and symmetric;  Skin: normal or tattoos Neurologic: Mental status: Alert, oriented, thought content appropriate Cranial nerves: normal (II-XII grossly intact)   Adult ECG Report - Not checked.  Recent Labs:   Lab Results  Component Value Date   CHOL 107 05/30/2014   HDL 23* 05/30/2014   LDLCALC 58 05/30/2014   TRIG 129 05/30/2014    CHOLHDL 4.7 05/30/2014    ASSESSMENT / PLAN: CAD S/P percutaneous coronary angioplasty: Inf STEMI - 100% dRCA - PCI Promus P 2.5 mm x 12mm (2.498mm) Single, short DES stent to the distal RCA. He is now on aspirin plus Effient. He has been new Effient car so we'll provide him a new prescription. On statin and Toprol. Within normal EF and well-controlled blood pressure, for ARB at this particular point. We don't have much more room to increase his beta blocker, so if there is additional blood pressure control as needed, I would start an ARB to avoid interaction with COPD/emphysema.   ST elevation myocardial infarction (STEMI) of inferior wall, initial episode of care I strongly encouraged him to at least go to whatever amounts of cardiac rehabilitation but he can do for this to work. He does do some psychosocial issues but also in order to understand  how best to get back into his normal activity routine.  Post MI, my policy for her returning to work when work is vigorous and stressful is 6 weeks. This is the time when the heart had essentially fully healed and he should be safe to go back to full activity. He tells me that his work is "all or none", and in this case I don't think he would be a great idea for him to return to work after simply 4 weeks. I provided him a letter stating that his return to work will be November 9.  Thankfully, this will allow him to have me be 3-4 sessions of cardiac rehabilitation prior to return to work  Cigarette smoker I congratulated him on his efforts. He is not 3 weeks out. He has not required medication assistance, but is having increased appetite issues. Smoking cessation instruction/counseling given:  commended patient for quitting and reviewed strategies for preventing relapses   Low HDL (under 40) Currently on atorvastatin. May need to consider prescription omega-3 fatty acids. Unfortunately there are not really good medical options for raising HDL. Can  consider niacin, but really exercises the only true way to increase HDL levels.  Borderline systolic hypertension He is on Toprol for his blood pressure is really at the upper limit of normal. Did not have room to titrate beta blocker, but consider ARB if his blood pressures were to increase.    No orders of the defined types were placed in this encounter.   Meds ordered this encounter  Medications  . DISCONTD: prasugrel (EFFIENT) 10 MG TABS tablet    Sig: Take 1 tablet (10 mg total) by mouth daily.    Dispense:  30 tablet    Refill:  6  . prasugrel (EFFIENT) 10 MG TABS tablet    Sig: Take 1 tablet (10 mg total) by mouth daily.    Dispense:  30 tablet    Refill:  6   Followup: 4-6 months   Caprice Wasko W, M.D., M.S. Interventional Cardiologist   Pager # [phone]

## 2014-06-24 NOTE — Assessment & Plan Note (Signed)
He is on Toprol for his blood pressure is really at the upper limit of normal. Did not have room to titrate beta blocker, but consider ARB if his blood pressures were to increase.

## 2014-06-24 NOTE — Assessment & Plan Note (Signed)
Single, short DES stent to the distal RCA. He is now on aspirin plus Effient. He has been new Effient car so we'll provide him a new prescription. On statin and Toprol. Within normal EF and well-controlled blood pressure, for ARB at this particular point. We don't have much more room to increase his beta blocker, so if there is additional blood pressure control as needed, I would start an ARB to avoid interaction with COPD/emphysema.

## 2014-06-24 NOTE — Telephone Encounter (Signed)
Please call Pam regarding some medical records.

## 2014-06-24 NOTE — Telephone Encounter (Signed)
LVM @ 1023

## 2014-06-27 ENCOUNTER — Telehealth: Payer: Self-pay

## 2014-06-27 NOTE — Telephone Encounter (Signed)
Faxed office note as requested by patient

## 2014-06-27 NOTE — Telephone Encounter (Signed)
Letter has been typed. Please print and I can sign when I arrive at clinic.

## 2014-06-27 NOTE — Telephone Encounter (Signed)
Pt called states he needs a note for his disability stating he can be out 2 more weeks. Please call.

## 2014-06-28 ENCOUNTER — Encounter: Payer: Self-pay | Admitting: Cardiovascular Disease

## 2014-06-28 NOTE — Telephone Encounter (Signed)
LVM to inform patient Ryan Cantrell Cantrell has typed letter and I will call when ready   Letter placed in [name] folder to sign

## 2014-06-29 NOTE — Telephone Encounter (Signed)
Informed patient that his note is at front desk for pick up  Patient verbalized understanding

## 2014-07-03 ENCOUNTER — Encounter: Payer: Self-pay | Admitting: Cardiovascular Disease

## 2014-07-08 ENCOUNTER — Other Ambulatory Visit: Payer: Self-pay | Admitting: Family Medicine

## 2014-07-08 NOTE — Telephone Encounter (Signed)
Has physical coming up Okay to refill for a year

## 2014-07-08 NOTE — Telephone Encounter (Signed)
Last office visit 10/29/2013 with Nicki Reaperegina Baity.  Ok to refill?

## 2014-07-15 DIAGNOSIS — J439 Emphysema, unspecified: Secondary | ICD-10-CM | POA: Insufficient documentation

## 2014-07-21 ENCOUNTER — Encounter: Payer: Self-pay | Admitting: Family Medicine

## 2014-07-21 ENCOUNTER — Ambulatory Visit (INDEPENDENT_AMBULATORY_CARE_PROVIDER_SITE_OTHER): Payer: No Typology Code available for payment source | Admitting: Family Medicine

## 2014-07-21 ENCOUNTER — Telehealth: Payer: Self-pay

## 2014-07-21 VITALS — BP 120/80 | HR 74 | Wt 151.0 lb

## 2014-07-21 DIAGNOSIS — L02619 Cutaneous abscess of unspecified foot: Secondary | ICD-10-CM

## 2014-07-21 DIAGNOSIS — L309 Dermatitis, unspecified: Secondary | ICD-10-CM

## 2014-07-21 DIAGNOSIS — L03119 Cellulitis of unspecified part of limb: Secondary | ICD-10-CM

## 2014-07-21 MED ORDER — PREDNISONE 20 MG PO TABS: ORAL_TABLET | ORAL | Status: DC

## 2014-07-21 MED ORDER — DOXYCYCLINE HYCLATE 100 MG PO TABS: 100.0000 mg | ORAL_TABLET | Freq: Two times a day (BID) | ORAL | Status: DC

## 2014-07-21 NOTE — Patient Instructions (Addendum)
Start and complete antibiotics x 10 days. Complete prednisone taper. Start topical cream twice daily on skin for flares if needed. Close follow up with Dr. B or Dr. Elbert EwingsL in 1 week. Call if redness spreading or new fever.

## 2014-07-21 NOTE — Progress Notes (Signed)
Pre visit review using our clinic review tool, if applicable. No additional management support is needed unless otherwise documented below in the visit note. 

## 2014-07-21 NOTE — Telephone Encounter (Signed)
Pt s wife wants prednisone and vibra tabs [facility] [place] and [facility]; Mrs Suzie Portelaayne will [facility] and [facility].

## 2014-07-21 NOTE — Progress Notes (Signed)
   Subjective:    Patient ID: Ryan Cantrell, male    DOB: 1961-07-12, 53 y.o.   MRN: [id number]  HPI 53 year old male pt of Dr. Karle StarchLetvak's presents for recurrent onset rash. Worst on left heel  Very itchy.  Discharge and oozing yellowish fluid on help lesion in last week... Started as small area but spread rapidly after he scratched it and made it raw. Has noted odor to wound on heel in last week. He has MI on 10/27, admitted to hospital for stent placement.  Newly started effient. Bleeding more easily.    He has history of recurrent lesions on arms and legs and feet of 15 years. Dx with eczema  Usually appears like dry red spots but can get bad if he scratches it.  triamcinolone has not helped in past.  Pred taper has help in past. Seen Derm before for this.   Review of Systems  Constitutional: Negative for fever and fatigue.  HENT: Negative for ear pain.   Eyes: Negative for pain.  Respiratory: Negative for shortness of breath.   Cardiovascular: Negative for chest pain.       Objective:   Physical Exam  Constitutional: Vital signs are normal. He appears well-developed and well-nourished.  HENT:  Head: Normocephalic.  Right Ear: Hearing normal.  Left Ear: Hearing normal.  Nose: Nose normal.  Mouth/Throat: Oropharynx is clear and moist and mucous membranes are normal.  Neck: Trachea normal. Carotid bruit is not present. No thyroid mass and no thyromegaly present.  Cardiovascular: Normal rate, regular rhythm and normal pulses.  Exam reveals no gallop, no distant heart sounds and no friction rub.   No murmur heard. No peripheral edema  Pulmonary/Chest: Effort normal and breath sounds normal. No respiratory distress.  Skin: Skin is warm, dry and intact. No rash noted.  Over arms and leg: small red scabbed lesions, flat, feet with many lesions, left heel with erythematous confluent patch 2x3 inch, weeping yellow fluid, crusty yellow.  Psychiatric: He has a normal mood and affect. His  speech is normal and behavior is normal. Thought content normal.          Assessment & Plan:  Appears consistent with severe eczema, concerning for bacterial superinfeciton at heel. Given recent hospitalization.. Cover for MRSA.  10 days of doxycycline 100 mg BID. Will treat with prednisone taper, can use topical compounded  eucerincream with betamethasone for flares ( written prescription given.  Close follow up in 1 week.

## 2014-07-22 ENCOUNTER — Telehealth: Payer: Self-pay | Admitting: Internal Medicine

## 2014-07-22 NOTE — Telephone Encounter (Signed)
emmi emailed °

## 2014-07-25 ENCOUNTER — Encounter: Payer: Self-pay | Admitting: Family Medicine

## 2014-07-27 NOTE — Telephone Encounter (Signed)
Called and spoke with Alvino ChapelEllen at [facility] and got phone number to call for PA and Insurance ID [id number] but when looking at his medication list,compound cream prescription was hand written, so I will have to wait until Dr. Ermalene SearingBedsole returns so she can tell me the ingredient strengths in cream. I did call the insurance and they faxed me the prior authorization form which was placed in Dr. Gaylan GeroldBedole's in box to complete.

## 2014-07-28 NOTE — Telephone Encounter (Signed)
Can you look into this for me.

## 2014-08-01 ENCOUNTER — Ambulatory Visit: Payer: No Typology Code available for payment source | Admitting: Internal Medicine

## 2014-08-03 ENCOUNTER — Telehealth: Payer: Self-pay | Admitting: *Deleted

## 2014-08-03 NOTE — Telephone Encounter (Signed)
Received fax form [facility] requesting PA for the Betamethasone 0.05% with Eucerin.  PA forms requested from [employer].  Forms completed by Dr. Ermalene SearingBedsole and faxed back to [employer] at [phone].   PA Denied.  States requested drug is not approved for coverage because the "Exclusions" section of your [facility] [place] Policy excludes coverage of services that do not meet the definition of "Medical Necessity. Mr. Ryan Cantrell notified thru MyChart and [facility] notified via fax.

## 2014-08-14 ENCOUNTER — Other Ambulatory Visit: Payer: Self-pay | Admitting: Internal Medicine

## 2014-09-30 ENCOUNTER — Ambulatory Visit (INDEPENDENT_AMBULATORY_CARE_PROVIDER_SITE_OTHER): Payer: No Typology Code available for payment source | Admitting: Internal Medicine

## 2014-09-30 ENCOUNTER — Encounter: Payer: Self-pay | Admitting: Internal Medicine

## 2014-09-30 VITALS — BP 124/78 | HR 69 | Temp 98.2°F | Wt 154.2 lb

## 2014-09-30 DIAGNOSIS — Z1211 Encounter for screening for malignant neoplasm of colon: Secondary | ICD-10-CM

## 2014-09-30 DIAGNOSIS — Z0000 Encounter for general adult medical examination without abnormal findings: Secondary | ICD-10-CM

## 2014-09-30 DIAGNOSIS — Z9861 Coronary angioplasty status: Secondary | ICD-10-CM

## 2014-09-30 DIAGNOSIS — I2510 Atherosclerotic heart disease of native coronary artery without angina pectoris: Secondary | ICD-10-CM

## 2014-09-30 DIAGNOSIS — J439 Emphysema, unspecified: Secondary | ICD-10-CM

## 2014-09-30 LAB — PSA: PSA: 1.39 ng/mL (ref 0.10–4.00)

## 2014-09-30 NOTE — Patient Instructions (Signed)
Please stop the e-cigarettes completely. Call 1-800 QUIT NOW for advice to help you stop.

## 2014-09-30 NOTE — Progress Notes (Signed)
Subjective:    Patient ID: Ryan Cantrell, male    DOB: Nov 26, 1960, 54 y.o.   MRN: [id number]  HPI Here for physical  Reviewed CAD history Switched to occasional puffs on e-cigarette--no regular cigarettes. I strongly urged him to stop this also  No apparent heart symptoms Keeping up with the cardiologist Missed 6 weeks of work---back to full regimen at work  Current Outpatient Prescriptions on File Prior to Visit  Medication Sig Dispense Refill  . acetaminophen (TYLENOL) 500 MG tablet Take 1,000 mg by mouth 2 (two) times daily as needed (pain).    Marland Kitchen albuterol (PROAIR HFA) 108 (90 BASE) MCG/ACT inhaler Inhale 2 puffs into the lungs daily as needed for wheezing or shortness of breath.    Marland Kitchen aspirin 81 MG tablet Take 1 tablet (81 mg total) by mouth daily.    Marland Kitchen atorvastatin (LIPITOR) 20 MG tablet Take 1 tablet (20 mg total) by mouth daily. 30 tablet 6  . metoprolol succinate (TOPROL XL) 25 MG 24 hr tablet Take 1 tablet (25 mg total) by mouth daily. 30 tablet 11  . nitroGLYCERIN (NITROSTAT) 0.4 MG SL tablet Place 1 tablet (0.4 mg total) under the tongue every 5 (five) minutes as needed for chest pain. 25 tablet 3  . prasugrel (EFFIENT) 10 MG TABS tablet Take 1 tablet (10 mg total) by mouth daily. 30 tablet 6  . SPIRIVA HANDIHALER 18 MCG inhalation capsule INHALE CONTENTS OF 1 CAPSULE VIA HANDIHALER ONCE DAILY 30 capsule 11   No current facility-administered medications on file prior to visit.    Allergies  Allergen Reactions  . Codeine Sulfate Nausea And Vomiting    Past Medical History  Diagnosis Date  . COPD (chronic obstructive pulmonary disease)      PFTs 2013.. Mod severe COPD...   . Emphysema   . ST elevation myocardial infarction (STEMI) of inferior wall, initial episode of care 05/29/2014  . CAD S/P percutaneous coronary angioplasty: Inf STEMI - 100% dRCA - PCI Promus P 2.5 mm x 12mm (2.3mm) 05/29/2014    a. MI 05/29/14 dRCA PCI - Promus Premier DES 2.5 mm x 12 mm (2.8 mm);  b. Echo: EF 55-60%, mild basal-inferior HK, normal DD    Past Surgical History  Procedure Laterality Date  . Inguinal hernia repair      twice on right and once on left-- 1980's-90's  . Right shoulder surgery  1987  . Fracture left humerus  2009    no surgery. Torn rotator cuff also  . Cardiac catheterization  05/29/2014    a. MI 05/29/14 dRCA 100%; D1 ~45%, Normal EF __> PCI to RCA  . Percutaneous coronary stent intervention (pci-s)      dRCA - Promus Premie DES 2.5 mm x 12 mm (2.8 mm)    Family History  Problem Relation Age of Onset  . Hypertension Mother   . Diabetes Mother   . Heart disease Mother     stent  . Cancer Father     prostate  . Hypertension Father   . Gout Father   . Heart disease Father     Stent  . Colon cancer Neg Hx   . Stomach cancer Neg Hx     History   Social History  . Marital Status: Married    Spouse Name: N/A    Number of Children: 1  . Years of Education: N/A   Occupational History  . Control and instrumentation engineer    Social History Main Topics  . Smoking  status: Current Some Day Smoker -- 1.00 packs/day    Types: Cigarettes, E-cigarettes    Last Attempt to Quit: 05/29/2014  . Smokeless tobacco: Never Used     Comment: occ e-cigarette--no cigarettes since MI  . Alcohol Use: No  . Drug Use: No  . Sexual Activity: Yes   Other Topics Concern  . Not on file   Social History Narrative   Married - 1 child.   Smokes 1/2 ppd.   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   Review of Systems  Constitutional: Positive for unexpected weight change. Negative for fatigue.       Wears seat belt Weight up 9# since last year (mostly off cigarettes)  HENT: Positive for dental problem. Negative for hearing loss and tinnitus.        Working on his bad teeth  Eyes: Negative for visual disturbance.       No diplopia or unilateral vision loss  Respiratory: Negative for cough, chest tightness and shortness of breath.   Cardiovascular:  Negative for chest pain, palpitations and leg swelling.  Gastrointestinal: Negative for nausea, vomiting, abdominal pain, constipation and blood in stool.       No heartburn  Endocrine: Negative for polydipsia and polyuria.  Genitourinary: Negative for dysuria, urgency and difficulty urinating.       No sexual problems  Musculoskeletal: Negative for back pain, joint swelling and arthralgias.  Skin: Negative for rash.       Ongoing eczema---better now  Allergic/Immunologic: Negative for environmental allergies and immunocompromised state.  Neurological: Negative for dizziness, syncope, weakness, light-headedness, numbness and headaches.  Hematological: Negative for adenopathy. Bruises/bleeds easily.  Psychiatric/Behavioral: Negative for sleep disturbance and dysphoric mood. The patient is not nervous/anxious.        Has more vivid dreams lately. Still sleeps well Mild mood issues after MI--but feels normal now       Objective:   Physical Exam  Constitutional: He is oriented to person, place, and time. He appears well-developed and well-nourished. No distress.  HENT:  Head: Normocephalic and atraumatic.  Right Ear: External ear normal.  Left Ear: External ear normal.  Mouth/Throat: Oropharynx is clear and moist. No oropharyngeal exudate.  Eyes: Conjunctivae and EOM are normal. Pupils are equal, round, and reactive to light.  Neck: Normal range of motion. Neck supple. No thyromegaly present.  Cardiovascular: Normal rate, regular rhythm, normal heart sounds and intact distal pulses.  Exam reveals no gallop.   No murmur heard. Pulmonary/Chest: Effort normal and breath sounds normal. No respiratory distress. He has no wheezes. He has no rales.  Abdominal: Soft. There is no tenderness.  Musculoskeletal: Normal range of motion. He exhibits no edema or tenderness.  Lymphadenopathy:    He has no cervical adenopathy.  Neurological: He is alert and oriented to person, place, and time.  Skin:  No rash noted. No erythema.  Psychiatric: He has a normal mood and affect. His behavior is normal.          Assessment & Plan:

## 2014-09-30 NOTE — Assessment & Plan Note (Signed)
Doing well now On appropriate meds Follows with cardiology

## 2014-09-30 NOTE — Progress Notes (Signed)
Pre visit review using our clinic review tool, if applicable. No additional management support is needed unless otherwise documented below in the visit note. 

## 2014-09-30 NOTE — Assessment & Plan Note (Signed)
UTD on colonoscopy--- due 2023 Will check PSA after discussion Must stop the nicotine

## 2014-09-30 NOTE — Assessment & Plan Note (Signed)
Breathing is better off cigarettes

## 2014-10-06 ENCOUNTER — Telehealth: Payer: Self-pay | Admitting: Cardiology

## 2014-10-06 NOTE — Telephone Encounter (Signed)
Returned call to patient's wife.She stated she needed Dr.Harding's advice.Stated husband just called her and said he thinks he has a front tooth going bad.Stated has facial swelling.Stated he has bad teeth needs teeth pulled.Stated they had to put off having dental work due to his heart attack.Stated she wanted to know if Dr.Harding knew a dentist.Stated does Dr.Harding need to see patient and will he prescribe a antibiotic.Stated she wanted to know if husband's heart is ok to have teeth pulled and is it ok to hold effient.Message sent to Dr.Harding for advice.

## 2014-10-06 NOTE — Telephone Encounter (Signed)
#  1: His heart is fine to have a tooth pulled #2: I would prefer not to have to hold Effient. However, if he is to have a tooth pulled, we may be a get away with stopping it for 3 or 4 days preprocedure with the understanding that he may very well bleed more than usual. If this is what needs to be done for infection is probably best. If he can be treated with antibiotic and delay for another 2 months that'll be more favorable from a cardiac standpoint. #3: I don't know any dentists, unfortunately -- perhaps our clinic staff (either here in [place] or in [place]) can help.

## 2014-10-06 NOTE — Telephone Encounter (Signed)
Pam is calling because Mr. Ryan Cantrell is in need of some dental work and wants to know if he needs an antibiotic to take before he sees a Education officer, communitydentist . Please call    Thanks

## 2014-10-07 NOTE — Telephone Encounter (Signed)
Received call back from patient's wife Dr.Harding advised heart is fine to have tooth pulled.He prefers not to hold Effient.Stated she will get husband appointment with dentist.Stated husband will eventually need all teeth pulled, they will discuss at next office with Dr.Harding.

## 2014-10-07 NOTE — Telephone Encounter (Signed)
Returned call to patient's wife no answer.LMTC. 

## 2014-10-26 ENCOUNTER — Encounter: Payer: Self-pay | Admitting: Cardiology

## 2014-10-26 ENCOUNTER — Ambulatory Visit (INDEPENDENT_AMBULATORY_CARE_PROVIDER_SITE_OTHER): Payer: No Typology Code available for payment source | Admitting: Cardiology

## 2014-10-26 VITALS — BP 120/84 | HR 73 | Ht 70.0 in | Wt 154.5 lb

## 2014-10-26 DIAGNOSIS — J449 Chronic obstructive pulmonary disease, unspecified: Secondary | ICD-10-CM

## 2014-10-26 DIAGNOSIS — R030 Elevated blood-pressure reading, without diagnosis of hypertension: Secondary | ICD-10-CM

## 2014-10-26 DIAGNOSIS — Z720 Tobacco use: Secondary | ICD-10-CM

## 2014-10-26 DIAGNOSIS — F17210 Nicotine dependence, cigarettes, uncomplicated: Secondary | ICD-10-CM

## 2014-10-26 DIAGNOSIS — I2510 Atherosclerotic heart disease of native coronary artery without angina pectoris: Secondary | ICD-10-CM

## 2014-10-26 DIAGNOSIS — R0789 Other chest pain: Secondary | ICD-10-CM

## 2014-10-26 DIAGNOSIS — I2119 ST elevation (STEMI) myocardial infarction involving other coronary artery of inferior wall: Secondary | ICD-10-CM

## 2014-10-26 DIAGNOSIS — E786 Lipoprotein deficiency: Secondary | ICD-10-CM

## 2014-10-26 DIAGNOSIS — E785 Hyperlipidemia, unspecified: Secondary | ICD-10-CM

## 2014-10-26 DIAGNOSIS — Z9861 Coronary angioplasty status: Secondary | ICD-10-CM

## 2014-10-26 NOTE — Patient Instructions (Signed)
Your physician recommends that you return for labs:  Fasting lipid and liver panel   Your physician wants you to follow-up in: In early September. You will receive a reminder letter in the mail two months in advance. If you don't receive a letter, please call our office to schedule the follow-up appointment.

## 2014-10-27 ENCOUNTER — Encounter: Payer: Self-pay | Admitting: Cardiology

## 2014-10-27 NOTE — Assessment & Plan Note (Signed)
I congratulated him on his efforts. While not ideal, certainly using the cigarettes is better than smoking. He's only doing it for 5 times a day which is minimal. He should try to wean down to 4.

## 2014-10-27 NOTE — Assessment & Plan Note (Addendum)
Doing well. No active angina. On dual antiplatelet therapy without significant bleeding. On beta blocker and statin. Tolerating well. The chest twinging episodes that he is having are probably more related to palpitations and probably not anginal in not in any way up near the symptoms he had for MI

## 2014-10-27 NOTE — Progress Notes (Signed)
PCP: Tillman Abide, MD  Clinic Note: Chief Complaint  Patient presents with  . Other    4 month f/u c/o "heart twinch". Meds reviewed verbally with pt.    HPI: Ryan Cantrell is a 54 y.o. male with a past medical history notable for inferior STEMI back in September 2015. He was transferred to [facility] from [facility] after initially being turned down by [facility]. Changes were relatively subtle. Upon arrival to: He was found to have an occluded RCA treated with DES stent and discharged on fast track protocol.  He did not tolerate radial access. I last saw him in October, and he was doing relatively well. He did have an ulnar vein thrombosis following his catheterization has resolved. He was subsequently returned to work, and only went to couple days of cardiac rehabilitation and  Past Medical History  Diagnosis Date  . COPD (chronic obstructive pulmonary disease)      PFTs 2013.. Mod severe COPD...   . Emphysema   . ST elevation myocardial infarction (STEMI) of inferior wall, initial episode of care 05/29/2014    Echocardiogram 05/30/2014: EF 55-60%. Mild HK of the basal inferior wall. Normal valves  . CAD S/P percutaneous coronary angioplasty: Inf STEMI - 100% dRCA - PCI Promus P 2.5 mm x 12mm (2.28mm) 05/29/2014    a. MI 05/29/14 dRCA PCI - Promus Premier DES 2.5 mm x 12 mm (2.8 mm); b. Echo: EF 55-60%, mild basal-inferior HK, normal DD   Interval History: He presents today doing quite well with no major complaints.  He has not had any further episodes of his anginal chest pain with rest or exertion. The only symptom that he notices occasional twinges in his chest usually at rest and are very fleeting. He denies dyspnea with exertion. He does however tend to shy away from strong exertion at work.  He has no heart failure symptoms of PND, orthopnea or edema. No palpitations psych heartbeats or irregular rhythms. No syncope or near syncope, TIA or amaurosis fugax. No melena, hematochezia,  hematuria, epistaxis. No claudication.  He has still been successful with his smoking cessation but is using his E-cigarette about 4-5 times a day.  ROS: A comprehensive was performed. Review of Systems  Constitutional: Negative for malaise/fatigue.  Respiratory: Negative for cough and shortness of breath.   Cardiovascular: Negative for claudication.  Gastrointestinal: Negative for blood in stool and melena.  Genitourinary: Negative for hematuria.  Musculoskeletal: Positive for joint pain (Knee pain).  Neurological: Negative for dizziness.  Endo/Heme/Allergies: Negative.   Psychiatric/Behavioral: Negative.   All other systems reviewed and are negative.   Current Outpatient Prescriptions on File Prior to Visit  Medication Sig Dispense Refill  . acetaminophen (TYLENOL) 500 MG tablet Take 1,000 mg by mouth 2 (two) times daily as needed (pain).    Marland Kitchen albuterol (PROAIR HFA) 108 (90 BASE) MCG/ACT inhaler Inhale 2 puffs into the lungs daily as needed for wheezing or shortness of breath.    Marland Kitchen aspirin 81 MG tablet Take 1 tablet (81 mg total) by mouth daily.    Marland Kitchen atorvastatin (LIPITOR) 20 MG tablet Take 1 tablet (20 mg total) by mouth daily. 30 tablet 6  . metoprolol succinate (TOPROL XL) 25 MG 24 hr tablet Take 1 tablet (25 mg total) by mouth daily. 30 tablet 11  . nitroGLYCERIN (NITROSTAT) 0.4 MG SL tablet Place 1 tablet (0.4 mg total) under the tongue every 5 (five) minutes as needed for chest pain. 25 tablet 3  .  prasugrel (EFFIENT) 10 MG TABS tablet Take 1 tablet (10 mg total) by mouth daily. 30 tablet 6  . SPIRIVA HANDIHALER 18 MCG inhalation capsule INHALE CONTENTS OF 1 CAPSULE VIA HANDIHALER ONCE DAILY 30 capsule 11   No current facility-administered medications on file prior to visit.   ALLERGIES REVIEWED IN EPIC -- No change  History   Social History Narrative   Married - 1 child.   Smokes 1/2 ppd.   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     FAMILY HISTORY REVIEWED IN EPIC -- No change  Wt Readings from Last 3 Encounters:  10/26/14 154 lb 8 oz (70.081 kg)  09/30/14 154 lb 4 oz (69.967 kg)  07/21/14 151 lb (68.493 kg)   PHYSICAL EXAM BP 120/84 mmHg  Pulse 73  Ht 5\' 10"  (1.778 m)  Wt 154 lb 8 oz (70.081 kg)  BMI 22.17 kg/m2 General appearance: alert, cooperative, appears stated age, no distress and well nourished Neck: no adenopathy, no carotid bruit and no JVD Lungs: CTAB, normal percussion bilaterally and non-labored Heart: RRR, S1& S2 normal, no murmur, click, rub or gallop Abdomen: soft, non-tender; bowel sounds normal; no masses,  no organomegaly;  Extremities: extremities normal, atraumatic, no cyanosis,or  edema (clearing bruise on R forearm, no more swelling) Pulses: 2+ and symmetric; right radial pulse is somewhat weak but palpable Skin: normal or tattoos Neurologic: Mental status: Alert, oriented, thought content appropriate Cranial nerves: normal (II-XII grossly intact)   Adult ECG Report  NSR, 73 bpm. Nonspecific QRS changes but otherwise normal EKG. Normal axis, intervals  Recent Labs:   Lab Results  Component Value Date   CHOL 107 05/30/2014   HDL 23* 05/30/2014   LDLCALC 58 05/30/2014   TRIG 129 05/30/2014   CHOLHDL 4.7 05/30/2014    ASSESSMENT / PLAN: ST elevation myocardial infarction (STEMI) of inferior wall, subsequent episode of care Doing very well now almost 6 months out. He still has some of the psychological ears that are expected in the first year post MI. He is however doing well with no active symptoms. He did only have mild hypokinesis in the infarcted area but with preserved EF. No signs of heart failure or recurrent angina.   CAD S/P percutaneous coronary angioplasty: Inf STEMI - 100% dRCA - PCI Promus P 2.5 mm x 12mm (2.188mm) Doing well. No active angina. On dual antiplatelet therapy without significant bleeding. On beta blocker and statin. Tolerating well. The chest  twinging episodes that he is having are probably more related to palpitations and probably not anginal in not in any way up near the symptoms he had for MI   Cigarette smoker I congratulated him on his efforts. While not ideal, certainly using the cigarettes is better than smoking. He's only doing it for 5 times a day which is minimal. He should try to wean down to 4.   Borderline systolic hypertension Blood pressures are stable on Toprol.   Low HDL (under 40) On atorvastatin. Due for having levels checked. Will order fasting lipid panel with LFTs today.   Moderate  severe COPD (chronic obstructive pulmonary disease) Management by PCP. On Spiriva. His wife is starting to note that he is snoring again. The symptoms that led to the diagnosis of COPD. I suggested using Breathe Right strips. Symptoms are not necessarily consistent with OSA.     Orders Placed This Encounter  Procedures  . Hepatic function panel    Standing Status: Future  Number of Occurrences:      Standing Expiration Date: 10/27/2015  . Lipid panel    Standing Status: Future     Number of Occurrences:      Standing Expiration Date: 10/27/2015  . EKG 12-Lead    Order Specific Question:  Where should this test be performed    Answer:  [facility]   No orders of the defined types were placed in this encounter.   Followup: September 2016   Marykay Lex, M.D., M.S. Interventional Cardiologist   Pager # [phone]

## 2014-10-27 NOTE — Assessment & Plan Note (Signed)
On atorvastatin. Due for having levels checked. Will order fasting lipid panel with LFTs today.

## 2014-10-27 NOTE — Assessment & Plan Note (Signed)
Management by PCP. On Spiriva. His wife is starting to note that he is snoring again. The symptoms that led to the diagnosis of COPD. I suggested using Breathe Right strips. Symptoms are not necessarily consistent with OSA.

## 2014-10-27 NOTE — Assessment & Plan Note (Signed)
Blood pressures are stable on Toprol.

## 2014-10-27 NOTE — Assessment & Plan Note (Signed)
Doing very well now almost 6 months out. He still has some of the psychological ears that are expected in the first year post MI. He is however doing well with no active symptoms. He did only have mild hypokinesis in the infarcted area but with preserved EF. No signs of heart failure or recurrent angina.

## 2014-10-31 ENCOUNTER — Other Ambulatory Visit (INDEPENDENT_AMBULATORY_CARE_PROVIDER_SITE_OTHER): Payer: No Typology Code available for payment source

## 2014-10-31 DIAGNOSIS — E785 Hyperlipidemia, unspecified: Secondary | ICD-10-CM

## 2014-11-01 LAB — LIPID PANEL
CHOL/HDL RATIO: 2.9 ratio (ref 0.0–5.0)
Cholesterol, Total: 100 mg/dL (ref 100–199)
HDL: 34 mg/dL — AB (ref >39)
LDL Calculated: 41 mg/dL (ref 0–99)
Triglycerides: 123 mg/dL (ref 0–149)
VLDL CHOLESTEROL CAL: 25 mg/dL (ref 5–40)

## 2014-11-01 LAB — HEPATIC FUNCTION PANEL
ALT: 18 IU/L (ref 0–44)
AST: 24 IU/L (ref 0–40)
Albumin: 4.6 g/dL (ref 3.5–5.5)
Alkaline Phosphatase: 73 IU/L (ref 39–117)
Bilirubin Total: 1.9 mg/dL — ABNORMAL HIGH (ref 0.0–1.2)
Bilirubin, Direct: 0.25 mg/dL (ref 0.00–0.40)
Total Protein: 7.2 g/dL (ref 6.0–8.5)

## 2014-11-03 ENCOUNTER — Encounter: Payer: Self-pay | Admitting: Cardiology

## 2014-12-21 ENCOUNTER — Ambulatory Visit (INDEPENDENT_AMBULATORY_CARE_PROVIDER_SITE_OTHER): Payer: No Typology Code available for payment source | Admitting: Internal Medicine

## 2014-12-21 ENCOUNTER — Encounter: Payer: Self-pay | Admitting: Internal Medicine

## 2014-12-21 VITALS — BP 120/60 | HR 64 | Temp 97.5°F | Wt 157.0 lb

## 2014-12-21 DIAGNOSIS — S8012XA Contusion of left lower leg, initial encounter: Secondary | ICD-10-CM | POA: Insufficient documentation

## 2014-12-21 NOTE — Assessment & Plan Note (Signed)
Mild injury Discussed the blood has moved by gravity to ankle Reassured-- no action needed now

## 2014-12-21 NOTE — Progress Notes (Signed)
Pre visit review using our clinic review tool, if applicable. No additional management support is needed unless otherwise documented below in the visit note. 

## 2014-12-21 NOTE — Progress Notes (Signed)
Subjective:    Patient ID: Ryan Cantrell, male    DOB: 1961/08/03, 54 y.o.   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 acetaminophen (TYLENOL) 500 MG tablet Take 1,000 mg by mouth 2 (two) times daily as needed (pain).    Marland Kitchen albuterol (PROAIR HFA) 108 (90 BASE) MCG/ACT inhaler Inhale 2 puffs into the lungs daily as needed for wheezing or shortness of breath.    Marland Kitchen aspirin 81 MG tablet Take 1 tablet (81 mg total) by mouth daily.    Marland Kitchen atorvastatin (LIPITOR) 20 MG tablet Take 1 tablet (20 mg total) by mouth daily. 30 tablet 6  . metoprolol succinate (TOPROL XL) 25 MG 24 hr tablet Take 1 tablet (25 mg total) by mouth daily. 30 tablet 11  . nitroGLYCERIN (NITROSTAT) 0.4 MG SL tablet Place 1 tablet (0.4 mg total) under the tongue every 5 (five) minutes as needed for chest pain. 25 tablet 3  . prasugrel (EFFIENT) 10 MG TABS tablet Take 1 tablet (10 mg total) by mouth daily. 30 tablet 6  . SPIRIVA HANDIHALER 18 MCG inhalation capsule INHALE CONTENTS OF 1 CAPSULE VIA HANDIHALER ONCE DAILY 30 capsule 11   No current facility-administered medications on file prior to visit.    Allergies  Allergen Reactions  . Codeine Sulfate Nausea And Vomiting    Past Medical History  Diagnosis Date  . COPD (chronic obstructive pulmonary disease)      PFTs 2013.. Mod severe COPD...   . Emphysema   . ST elevation myocardial infarction (STEMI) of inferior wall, initial episode of care 05/29/2014    Echocardiogram 05/30/2014: EF 55-60%. Mild HK of the basal inferior wall. Normal valves   . CAD S/P percutaneous coronary angioplasty: Inf STEMI - 100% dRCA - PCI Promus P 2.5 mm x 12mm (2.62mm) 05/29/2014    a. MI 05/29/14 dRCA PCI - Promus Premier DES 2.5 mm x 12 mm (2.8 mm); b. Echo: EF 55-60%, mild basal-inferior HK, normal DD    Past Surgical History  Procedure Laterality Date  . Inguinal hernia repair      twice on right and once on left-- 1980's-90's  . Right shoulder surgery  1987  . Fracture left humerus  2009    no surgery. Torn rotator cuff also  . Cardiac catheterization  05/29/2014    a. MI 05/29/14 dRCA 100%; D1 ~45%, Normal EF __> PCI to RCA  . Percutaneous coronary stent intervention (pci-s)      dRCA - Promus Premie DES 2.5 mm x 12 mm (2.8 mm)    Family History  Problem Relation Age of Onset  . Hypertension Mother   . Diabetes Mother   . Heart disease Mother     stent  . Cancer Father     prostate  . Hypertension Father   . Gout Father   . Heart disease Father     Stent  . Colon cancer Neg Hx   . Stomach cancer Neg Hx     History   Social History  . Marital  Status: Married    Spouse Name: N/A  . Number of Children: 1  . 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 Smoking status: Current Some Day Smoker -- 1.00 packs/day    Types: Cigarettes, E-cigarettes    Last Attempt to Quit: 05/29/2014  . Smokeless tobacco: Never Used     Comment: occ e-cigarette--no cigarettes since MI  . Alcohol Use: No  . Drug Use: No  . Sexual Activity: Yes   Other Topics Concern  . Not on file   Social History Narrative   Married - 1 child.   Smokes 1/2 ppd.   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   Review of Systems No fever No GI symptoms    Objective:   Physical Exam  Musculoskeletal:  Slight discolored swelling at lateral left ankle  Skin:  Small open area with clean eschar on low anterior calf No swelling now Slight tenderness but no inflammation           Assessment & Plan:

## 2015-01-12 ENCOUNTER — Other Ambulatory Visit: Payer: Self-pay | Admitting: Cardiology

## 2015-01-13 ENCOUNTER — Other Ambulatory Visit: Payer: Self-pay | Admitting: *Deleted

## 2015-01-13 MED ORDER — ATORVASTATIN CALCIUM 20 MG PO TABS: 20.0000 mg | ORAL_TABLET | Freq: Every day | ORAL | Status: DC

## 2015-02-01 ENCOUNTER — Telehealth: Payer: Self-pay | Admitting: *Deleted

## 2015-02-01 NOTE — Telephone Encounter (Signed)
S/w wife who indicated three weeks ago pt  was having nosebleeds.  Wife stated amount of blood on pillowcase was about the size of a pencil eraser up to size of a nickel. States pt was able to stop bleeding easily. Fewer nosebleeds last week and this week. She denies he has been having blood in urine or stool. No unusual bruising.  Pt wife states she thinks it could be allergies. Has appt with Dr. Herbie BaltimoreHarding in August but wanted to let us know now. Advised pt wife to monitor nosebleeds and to contact office if difficulty stopping bleeds, more frequent bleeds or signs of blood in stool or urine as well as any unusual bruising. Advised continuing effient and aspirin as prescribed.  She indicated he would continue medications.

## 2015-02-01 NOTE — Telephone Encounter (Signed)
Pt wife is calling stating pt is recently getting nose bleeds here and there.  Pt is on blood thinner. Just wants to make sure he is okay.  She will also call pcp and try and get him an apt to see them. Just in case it is allergies.  Please advise.

## 2015-02-17 ENCOUNTER — Other Ambulatory Visit: Payer: Self-pay | Admitting: Cardiology

## 2015-02-17 NOTE — Telephone Encounter (Signed)
Rx(s) sent to pharmacy electronically.  

## 2015-05-03 ENCOUNTER — Encounter: Payer: Self-pay | Admitting: Cardiology

## 2015-05-03 ENCOUNTER — Ambulatory Visit (INDEPENDENT_AMBULATORY_CARE_PROVIDER_SITE_OTHER): Payer: No Typology Code available for payment source | Admitting: Cardiology

## 2015-05-03 VITALS — BP 110/70 | HR 64 | Ht 70.0 in | Wt 158.8 lb

## 2015-05-03 DIAGNOSIS — E786 Lipoprotein deficiency: Secondary | ICD-10-CM | POA: Diagnosis not present

## 2015-05-03 DIAGNOSIS — Z87891 Personal history of nicotine dependence: Secondary | ICD-10-CM | POA: Diagnosis not present

## 2015-05-03 DIAGNOSIS — S8012XD Contusion of left lower leg, subsequent encounter: Secondary | ICD-10-CM | POA: Diagnosis not present

## 2015-05-03 DIAGNOSIS — Z9861 Coronary angioplasty status: Secondary | ICD-10-CM | POA: Diagnosis not present

## 2015-05-03 DIAGNOSIS — I2510 Atherosclerotic heart disease of native coronary artery without angina pectoris: Secondary | ICD-10-CM | POA: Diagnosis not present

## 2015-05-03 DIAGNOSIS — I2119 ST elevation (STEMI) myocardial infarction involving other coronary artery of inferior wall: Secondary | ICD-10-CM

## 2015-05-03 MED ORDER — CLOPIDOGREL BISULFATE 75 MG PO TABS: 75.0000 mg | ORAL_TABLET | Freq: Every day | ORAL | Status: DC

## 2015-05-03 NOTE — Assessment & Plan Note (Signed)
HDL 34. LDL is actually a goal of 41. It may just be simply with a total cholesterol 130 HDL numbers will not be at target level. He is due to have labs checked in February of next year. Pending the results of follow-up labs, may want to consider advanced lipid panel to confirm particle number versus calculated number.

## 2015-05-03 NOTE — Assessment & Plan Note (Signed)
No cigarettes since his MI. He does use occasional I cigarettes which is acceptable as an alternative as long as he is not using a lot.

## 2015-05-03 NOTE — Assessment & Plan Note (Signed)
He is doing very well any active angina. He is on dual therapy and is having some bruising issues. He is also somewhat concerned about the cost of Effient. Discontinue Effient at completion of current pill bottle and switch to Plavix. First 2 days he will take 150 mg daily and then 75 mg daily. Mild musculoskeletal chest pains with no anginal pains. Continue low-dose beta blocker and statin. Has when necessary nitroglycerin, not having to use.

## 2015-05-03 NOTE — Patient Instructions (Signed)
Medication Instructions:  Your physician has recommended you make the following change in your medication:  FINISH Effient then STOP taking  START Plavix after you finish taking Effient. Take  (2 tablets) for two days then take  (1 tablet) once per day    Labwork: Your physician recommends that you return for lab work in February: lipid, liver, BMET   Testing/Procedures: none  Follow-Up: Your physician wants you to follow-up in: one year with Dr. Herbie Baltimore.  You will receive a reminder letter in the mail two months in advance. If you don't receive a letter, please call our office to schedule the follow-up appointment.   Any Other Special Instructions Will Be Listed Below (If Applicable).

## 2015-05-03 NOTE — Assessment & Plan Note (Signed)
If he has a significant bruise, probably consider holding aspirin for a week or so. This should help.

## 2015-05-03 NOTE — Progress Notes (Signed)
PCP: Tillman Abide, MD  Clinic Note: Chief Complaint  Patient presents with  . Follow-up    6 mo  . Coronary Artery Disease    Inferior STEMI status post RCA PCI - essentially anniversary visit    HPI: Ryan Cantrell is a 54 y.o. male with a past medical history notable for inferior STEMI back in September 2015.  He underwent PCI to the distal RCA via femoral access due to radial spasm.  I saw him in February 2016, and he was doing quite well without any major complaints. He did note occasional "twinges in the chest findings no significant symptoms.  No new studies or hospitalizations.  Past Medical History  Diagnosis Date  . COPD (chronic obstructive pulmonary disease)      PFTs 2013.. Mod severe COPD...   . Emphysema   . ST elevation myocardial infarction (STEMI) of inferior wall, initial episode of care 05/29/2014    Echocardiogram 05/30/2014: EF 55-60%. Mild HK of the basal inferior wall. Normal valves  . CAD S/P percutaneous coronary angioplasty: Inf STEMI - 100% dRCA - PCI Promus P 2.5 mm x 12mm (2.35mm) 05/29/2014    a. MI 05/29/14 dRCA PCI - Promus Premier DES 2.5 mm x 12 mm (2.8 mm); b. Echo: EF 55-60%, mild basal-inferior HK, normal DD   Interval History: He presents today doing quite well with no major complaints.  He has not had any further episodes of his anginal chest pain with rest or exertion. The only symptom that he notices occasional twinges in his chest usually at rest and are very fleeting. He denies dyspnea with exertion. He does seem to be a little bit more willing to be involved in exertional activity at work. Is not getting any routine exercise but is always on the go. He has no heart failure symptoms of PND, orthopnea or edema. No palpitations, irregular heartbeats or irregular rhythms. No syncope or near syncope, TIA or amaurosis fugax. No melena, hematochezia, hematuria, epistaxis. No claudication.  He did have one episode of severe bruise behind his left  knee, likely from abnormality of his legs crossed at the knees. He is also had a little bit of bruising locations significant..  He has still been successful with his smoking cessation but is still using his E-cigarette about 3-4 times a day.  ROS: A comprehensive was performed. Review of Systems  Constitutional: Negative for malaise/fatigue.  HENT: Negative for nosebleeds.   Respiratory: Negative for cough and shortness of breath.   Cardiovascular: Negative for claudication.  Gastrointestinal: Negative for blood in stool and melena.  Genitourinary: Negative for hematuria.  Musculoskeletal: Positive for joint pain (Knee pain).  Skin: Positive for rash (Has mild eczema type rash on the chest).  Neurological: Negative.  Negative for dizziness.  Endo/Heme/Allergies: Bruises/bleeds easily.  Psychiatric/Behavioral: Negative.   All other systems reviewed and are negative.   Current Outpatient Prescriptions on File Prior to Visit  Medication Sig Dispense Refill  . acetaminophen (TYLENOL) 500 MG tablet Take 1,000 mg by mouth 2 (two) times daily as needed (pain).    Marland Kitchen albuterol (PROAIR HFA) 108 (90 BASE) MCG/ACT inhaler Inhale 2 puffs into the lungs daily as needed for wheezing or shortness of breath.    Marland Kitchen aspirin 81 MG tablet Take 1 tablet (81 mg total) by mouth daily.    Marland Kitchen atorvastatin (LIPITOR) 20 MG tablet Take 1 tablet (20 mg total) by mouth daily. 30 tablet 6  . metoprolol succinate (TOPROL XL) 25 MG 24  hr tablet Take 1 tablet (25 mg total) by mouth daily. 30 tablet 11  . nitroGLYCERIN (NITROSTAT) 0.4 MG SL tablet Place 1 tablet (0.4 mg total) under the tongue every 5 (five) minutes as needed for chest pain. 25 tablet 3  . SPIRIVA HANDIHALER 18 MCG inhalation capsule INHALE CONTENTS OF 1 CAPSULE VIA HANDIHALER ONCE DAILY 30 capsule 11   No current facility-administered medications on file prior to visit.   Allergies  Allergen Reactions  . Codeine Sulfate Nausea And Vomiting     Social History   Social History Narrative   Married - 1 child.   Smokes 1/2 ppd.   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   family history includes Cancer in his father; Diabetes in his mother; Gout in his father; Heart disease in his father and mother; Hypertension in his father and mother. There is no history of Colon cancer or Stomach cancer.  Wt Readings from Last 3 Encounters:  05/03/15 158 lb 12.8 oz (72.031 kg)  12/21/14 157 lb (71.215 kg)  10/26/14 154 lb 8 oz (70.081 kg)   PHYSICAL EXAM BP 110/70 mmHg  Pulse 64  Ht 5\' 10"  (1.778 m)  Wt 158 lb 12.8 oz (72.031 kg)  BMI 22.79 kg/m2 General appearance: alert, cooperative, appears stated age, no distress and well nourished Neck: no adenopathy, no carotid bruit and no JVD Lungs: CTAB, normal percussion bilaterally and non-labored Heart: RRR, S1& S2 normal, no murmur, click, rub or gallop Abdomen: soft, non-tender; bowel sounds normal; no masses,  no organomegaly;  Extremities: extremities normal, atraumatic, no cyanosis,or  edema (clearing bruise on R forearm, no more swelling) Pulses: 2+ and symmetric; right radial pulse is somewhat weak but palpable Skin: normal or tattoos Neurologic: Mental status: Alert, oriented, thought content appropriate Cranial nerves: normal (II-XII grossly intact)   Adult ECG Report  NSR, 64 bpm - occasional PVCs. Rightward axis (95). Nonspecific QRS changes but otherwise normal axis, intervals  Recent Labs:   Lab Results  Component Value Date   CHOL 100 10/31/2014   HDL 34* 10/31/2014   LDLCALC 41 10/31/2014   TRIG 123 10/31/2014   CHOLHDL 2.9 10/31/2014    ASSESSMENT / PLAN: Problem List Items Addressed This Visit    CAD S/P percutaneous coronary angioplasty: Inf STEMI - 100% dRCA - PCI Promus P 2.5 mm x 12mm (2.36mm) - Primary (Chronic)    He is doing very well any active angina. He is on dual therapy and is having some bruising issues. He is also  somewhat concerned about the cost of Effient. Discontinue Effient at completion of current pill bottle and switch to Plavix. First 2 days he will take 150 mg daily and then 75 mg daily. Mild musculoskeletal chest pains with no anginal pains. Continue low-dose beta blocker and statin. Has when necessary nitroglycerin, not having to use.      Relevant Orders   EKG 12-Lead   Hepatic function panel   Lipid Profile   Basic Metabolic Panel (BMET)   Contusion of left lower leg    If he has a significant bruise, probably consider holding aspirin for a week or so. This should help.      Former cigarette smoker (Chronic)    No cigarettes since his MI. He does use occasional I cigarettes which is acceptable as an alternative as long as he is not using a lot.      Low HDL (under 40) (Chronic)    HDL  34. LDL is actually a goal of 41. It may just be simply with a total cholesterol 130 HDL numbers will not be at target level. He is due to have labs checked in February of next year. Pending the results of follow-up labs, may want to consider advanced lipid panel to confirm particle number versus calculated number.      ST elevation myocardial infarction (STEMI) of inferior wall, subsequent episode of care (Chronic)      Meds ordered this encounter  Medications  . clopidogrel (PLAVIX) 75 MG tablet    Sig: Take 1 tablet (75 mg total) by mouth daily. Take 2 tablets daily for two days then take 1 tablet by mouth daily    Dispense:  30 tablet    Refill:  11   Medication Instructions:  Your physician has recommended you make the following change in your medication:  FINISH Effient then STOP taking  START Plavix after you finish taking Effient. Take 150mg  (2 tablets) for two days then take 75mg  (1 tablet) once per day    Labwork: Your physician recommends that you return for lab work in February: lipid, liver, BMET   Testing/Procedures: none  Follow-Up: Your physician wants you to follow-up  in: one year with Dr. Herbie Baltimore.   Marykay Lex, M.D., M.S. Interventional Cardiologist   Pager # [phone]

## 2015-05-16 ENCOUNTER — Other Ambulatory Visit: Payer: Self-pay | Admitting: Cardiology

## 2015-05-25 ENCOUNTER — Encounter: Payer: Self-pay | Admitting: Internal Medicine

## 2015-06-01 ENCOUNTER — Other Ambulatory Visit: Payer: Self-pay | Admitting: *Deleted

## 2015-06-01 MED ORDER — UMECLIDINIUM BROMIDE 62.5 MCG/INH IN AEPB: 1.0000 | INHALATION_SPRAY | Freq: Every day | RESPIRATORY_TRACT | Status: DC

## 2015-06-01 NOTE — Telephone Encounter (Signed)
Let him know I sent the Rx Delete the spiriva

## 2015-06-01 NOTE — Telephone Encounter (Signed)
Received fax stating pt would like to try the Incruse Ellipta because the cost of the Spriva has gone up, please advise on the instructions

## 2015-06-07 ENCOUNTER — Encounter: Payer: Self-pay | Admitting: Cardiology

## 2015-07-15 ENCOUNTER — Encounter: Payer: Self-pay | Admitting: Internal Medicine

## 2015-07-17 MED ORDER — ALBUTEROL SULFATE HFA 108 (90 BASE) MCG/ACT IN AERS: 2.0000 | INHALATION_SPRAY | Freq: Every day | RESPIRATORY_TRACT | Status: DC | PRN

## 2015-08-02 ENCOUNTER — Other Ambulatory Visit: Payer: Self-pay | Admitting: *Deleted

## 2015-08-02 MED ORDER — ATORVASTATIN CALCIUM 20 MG PO TABS: 20.0000 mg | ORAL_TABLET | Freq: Every day | ORAL | Status: DC

## 2015-08-02 MED ORDER — METOPROLOL SUCCINATE ER 25 MG PO TB24: 25.0000 mg | ORAL_TABLET | Freq: Every day | ORAL | Status: DC

## 2015-08-02 MED ORDER — CLOPIDOGREL BISULFATE 75 MG PO TABS: 75.0000 mg | ORAL_TABLET | Freq: Every day | ORAL | Status: DC

## 2015-08-31 ENCOUNTER — Encounter: Payer: Self-pay | Admitting: Internal Medicine

## 2015-08-31 ENCOUNTER — Ambulatory Visit (INDEPENDENT_AMBULATORY_CARE_PROVIDER_SITE_OTHER): Payer: 59 | Admitting: Internal Medicine

## 2015-08-31 VITALS — BP 120/80 | HR 70 | Temp 97.7°F | Wt 158.0 lb

## 2015-08-31 DIAGNOSIS — R21 Rash and other nonspecific skin eruption: Secondary | ICD-10-CM

## 2015-08-31 MED ORDER — PREDNISONE 20 MG PO TABS: 40.0000 mg | ORAL_TABLET | Freq: Every day | ORAL | Status: DC

## 2015-08-31 MED ORDER — FLUCONAZOLE 100 MG PO TABS: 100.0000 mg | ORAL_TABLET | Freq: Every day | ORAL | Status: DC

## 2015-08-31 NOTE — Progress Notes (Signed)
Subjective:    Patient ID: Ryan SquiresMark A [place], male    DOB: [birth year], 54 y.o.   MRN: [id number]  HPI Here due to rash  Has pain sores on hands-- started mildly on left hand 2 weeks ago and has spread Has tried his prescription creams --triamcinolone--without help Mostly on right hand but also left Reminds him of the rash on feet--- though they are clear  No fever  Current Outpatient Prescriptions on File Prior to Visit  Medication Sig Dispense Refill  . acetaminophen (TYLENOL) 500 MG tablet Take 1,000 mg by mouth 2 (two) times daily as needed (pain).    Marland Kitchen. albuterol (PROAIR HFA) 108 (90 BASE) MCG/ACT inhaler Inhale 2 puffs into the lungs daily as needed for wheezing or shortness of breath. 1 Inhaler 1  . aspirin 81 MG tablet Take 1 tablet (81 mg total) by mouth daily.    Marland Kitchen. atorvastatin (LIPITOR) 20 MG tablet Take 1 tablet (20 mg total) by mouth daily. 90 tablet 2  . clopidogrel (PLAVIX) 75 MG tablet Take 1 tablet (75 mg total) by mouth daily. 90 tablet 2  . metoprolol succinate (TOPROL-XL) 25 MG 24 hr tablet Take 1 tablet (25 mg total) by mouth daily. 90 tablet 2  . nitroGLYCERIN (NITROSTAT) 0.4 MG SL tablet Place 1 tablet (0.4 mg total) under the tongue every 5 (five) minutes as needed for chest pain. 25 tablet 3  . Umeclidinium Bromide (INCRUSE ELLIPTA) 62.5 MCG/INH AEPB Inhale 1 each into the lungs daily. 30 each 11   No current facility-administered medications on file prior to visit.    Allergies  Allergen Reactions  . Codeine Sulfate Nausea And Vomiting    Past Medical History  Diagnosis Date  . COPD (chronic obstructive pulmonary disease) (HCC)      PFTs 2013.. Mod severe COPD...   . Emphysema   . ST elevation myocardial infarction (STEMI) of inferior wall, initial episode of care (HCC) 05/29/2014    Echocardiogram 05/30/2014: EF 55-60%. Mild HK of the basal inferior wall. Normal valves  . CAD S/P percutaneous coronary angioplasty: Inf STEMI - 100% dRCA - PCI Promus P 2.5  mm x 12mm (2.588mm) 05/29/2014    a. MI 05/29/14 dRCA PCI - Promus Premier DES 2.5 mm x 12 mm (2.8 mm); b. Echo: EF 55-60%, mild basal-inferior HK, normal DD    Past Surgical History  Procedure Laterality Date  . Inguinal hernia repair      twice on right and once on left-- 1980's-90's  . Right shoulder surgery  1987  . Fracture left humerus  2009    no surgery. Torn rotator cuff also  . Cardiac catheterization  05/29/2014    a. MI 05/29/14 dRCA 100%; D1 ~45%, Normal EF __> PCI to RCA  . Percutaneous coronary stent intervention (pci-s)      dRCA - Promus Premie DES 2.5 mm x 12 mm (2.8 mm)    Family History  Problem Relation Age of Onset  . Hypertension Mother   . Diabetes Mother   . Heart disease Mother     stent  . Cancer Father     prostate  . Hypertension Father   . Gout Father   . Heart disease Father     Stent  . Colon cancer Neg Hx   . Stomach cancer Neg Hx     Social History   Social History  . Marital Status: Married    Spouse Name: N/A  . Number of Children: 1  .  Years of Education: N/A   Occupational History  . Control and instrumentation engineer    Social History Main Topics  . Smoking status: Current Some Day Smoker -- 1.00 packs/day    Types: Cigarettes, E-cigarettes    Last Attempt to Quit: 05/29/2014  . Smokeless tobacco: Never Used     Comment: occ e-cigarette--no cigarettes since MI  . Alcohol Use: No  . Drug Use: No  . Sexual Activity: Yes   Other Topics Concern  . Not on file   Social History Narrative   Married - 1 child.   Smokes 1/2 ppd.   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

## 2015-08-31 NOTE — Progress Notes (Signed)
Pre visit review using our clinic review tool, if applicable. No additional management support is needed unless otherwise documented below in the visit note. 

## 2015-08-31 NOTE — Patient Instructions (Signed)
Please use a thick moisturizing cream on your hands every day -- and only use moisturizing soap.

## 2015-08-31 NOTE — Assessment & Plan Note (Signed)
Eczematous appearance but I suspect fungal due to location Will treat with steroid taper, and fluconazole Moisturizers and can use the TAC

## 2015-10-06 ENCOUNTER — Encounter: Payer: Self-pay | Admitting: Internal Medicine

## 2015-10-06 ENCOUNTER — Ambulatory Visit (INDEPENDENT_AMBULATORY_CARE_PROVIDER_SITE_OTHER): Payer: 59 | Admitting: Internal Medicine

## 2015-10-06 VITALS — BP 120/70 | HR 68 | Temp 98.1°F | Ht 70.0 in | Wt 157.0 lb

## 2015-10-06 DIAGNOSIS — I2510 Atherosclerotic heart disease of native coronary artery without angina pectoris: Secondary | ICD-10-CM | POA: Diagnosis not present

## 2015-10-06 DIAGNOSIS — Z0000 Encounter for general adult medical examination without abnormal findings: Secondary | ICD-10-CM | POA: Diagnosis not present

## 2015-10-06 DIAGNOSIS — Z9861 Coronary angioplasty status: Secondary | ICD-10-CM | POA: Diagnosis not present

## 2015-10-06 DIAGNOSIS — J439 Emphysema, unspecified: Secondary | ICD-10-CM | POA: Diagnosis not present

## 2015-10-06 LAB — LIPID PANEL
Cholesterol: 97 mg/dL (ref 0–200)
HDL: 35.1 mg/dL — AB (ref >39.00)
LDL CALC: 41 mg/dL (ref 0–99)
NONHDL: 62.24
Total CHOL/HDL Ratio: 3
Triglycerides: 106 mg/dL (ref 0.0–149.0)
VLDL: 21.2 mg/dL (ref 0.0–40.0)

## 2015-10-06 LAB — COMPREHENSIVE METABOLIC PANEL
ALK PHOS: 62 U/L (ref 39–117)
ALT: 18 U/L (ref 0–53)
AST: 20 U/L (ref 0–37)
Albumin: 4.5 g/dL (ref 3.5–5.2)
BUN: 6 mg/dL (ref 6–23)
CO2: 32 mEq/L (ref 19–32)
Calcium: 9.7 mg/dL (ref 8.4–10.5)
Chloride: 96 mEq/L (ref 96–112)
Creatinine, Ser: 0.79 mg/dL (ref 0.40–1.50)
GFR: 108.69 mL/min (ref >60.00)
GLUCOSE: 105 mg/dL — AB (ref 70–99)
POTASSIUM: 3.8 meq/L (ref 3.5–5.1)
Sodium: 134 mEq/L — ABNORMAL LOW (ref 135–145)
TOTAL PROTEIN: 7.6 g/dL (ref 6.0–8.3)
Total Bilirubin: 1.6 mg/dL — ABNORMAL HIGH (ref 0.2–1.2)

## 2015-10-06 LAB — CBC WITH DIFFERENTIAL/PLATELET
BASOS ABS: 0 10*3/uL (ref 0.0–0.1)
Basophils Relative: 0.7 % (ref 0.0–3.0)
EOS PCT: 1.9 % (ref 0.0–5.0)
Eosinophils Absolute: 0.1 10*3/uL (ref 0.0–0.7)
HCT: 41.2 % (ref 39.0–52.0)
HEMOGLOBIN: 13.8 g/dL (ref 13.0–17.0)
LYMPHS PCT: 24.2 % (ref 12.0–46.0)
Lymphs Abs: 1.4 10*3/uL (ref 0.7–4.0)
MCHC: 33.6 g/dL (ref 30.0–36.0)
MCV: 86.2 fl (ref 78.0–100.0)
MONOS PCT: 10 % (ref 3.0–12.0)
Monocytes Absolute: 0.6 10*3/uL (ref 0.1–1.0)
Neutro Abs: 3.5 10*3/uL (ref 1.4–7.7)
Neutrophils Relative %: 63.2 % (ref 43.0–77.0)
Platelets: 438 10*3/uL — ABNORMAL HIGH (ref 150.0–400.0)
RBC: 4.78 Mil/uL (ref 4.22–5.81)
RDW: 13.1 % (ref 11.5–15.5)
WBC: 5.6 10*3/uL (ref 4.0–10.5)

## 2015-10-06 MED ORDER — TRIAMCINOLONE ACETONIDE 0.1 % EX CREA: 1.0000 "application " | TOPICAL_CREAM | Freq: Two times a day (BID) | CUTANEOUS | Status: DC | PRN

## 2015-10-06 NOTE — Assessment & Plan Note (Signed)
No symptoms Would defer to cardiologist using meds for ED

## 2015-10-06 NOTE — Assessment & Plan Note (Signed)
No meds and symptoms better

## 2015-10-06 NOTE — Progress Notes (Signed)
Pre visit review using our clinic review tool, if applicable. No additional management support is needed unless otherwise documented below in the visit note. 

## 2015-10-06 NOTE — Assessment & Plan Note (Signed)
Healthy but needs to work on fitness Defer PSA till at least next year Colon due 2023 Using e-cigarettes ---must stay away from regular cigarettes

## 2015-10-06 NOTE — Addendum Note (Signed)
Addended by: Harsh Trulock C on: 10/06/2015 01:49 PM   Modules accepted: SmartSet  

## 2015-10-06 NOTE — Progress Notes (Signed)
Subjective:    Patient ID: Ryan Cantrell, male    DOB: [birth year], 55 y.o.   MRN: [id number]  HPI Here for physical Doing well Still using e-cigarettes but has avoided cigarettes---not quite ready to give this up  Not really working on fitness Has treadmill--but has to move it in the house Discussed  Breathing is okay Stopped the spiriva due to cost Not using the incruse elliptal Very rare albuterol No regular cough  No chest pain No palpitations No dizziness or syncope No edema  Current Outpatient Prescriptions on File Prior to Visit  Medication Sig Dispense Refill  . acetaminophen (TYLENOL) 500 MG tablet Take 1,000 mg by mouth 2 (two) times daily as needed (pain).    Marland Kitchen albuterol (PROAIR HFA) 108 (90 BASE) MCG/ACT inhaler Inhale 2 puffs into the lungs daily as needed for wheezing or shortness of breath. 1 Inhaler 1  . aspirin 81 MG tablet Take 1 tablet (81 mg total) by mouth daily.    Marland Kitchen atorvastatin (LIPITOR) 20 MG tablet Take 1 tablet (20 mg total) by mouth daily. 90 tablet 2  . clopidogrel (PLAVIX) 75 MG tablet Take 1 tablet (75 mg total) by mouth daily. 90 tablet 2  . metoprolol succinate (TOPROL-XL) 25 MG 24 hr tablet Take 1 tablet (25 mg total) by mouth daily. 90 tablet 2  . nitroGLYCERIN (NITROSTAT) 0.4 MG SL tablet Place 1 tablet (0.4 mg total) under the tongue every 5 (five) minutes as needed for chest pain. 25 tablet 3   No current facility-administered medications on file prior to visit.    Allergies  Allergen Reactions  . Codeine Sulfate Nausea And Vomiting    Past Medical History  Diagnosis Date  . COPD (chronic obstructive pulmonary disease) (HCC)      PFTs 2013.. Mod severe COPD...   . Emphysema   . ST elevation myocardial infarction (STEMI) of inferior wall, initial episode of care (HCC) 05/29/2014    Echocardiogram 05/30/2014: EF 55-60%. Mild HK of the basal inferior wall. Normal valves  . CAD S/P percutaneous coronary angioplasty: Inf STEMI - 100%  dRCA - PCI Promus P 2.5 mm x 12mm (2.69mm) 05/29/2014    a. MI 05/29/14 dRCA PCI - Promus Premier DES 2.5 mm x 12 mm (2.8 mm); b. Echo: EF 55-60%, mild basal-inferior HK, normal DD    Past Surgical History  Procedure Laterality Date  . Inguinal hernia repair      twice on right and once on left-- 1980's-90's  . Right shoulder surgery  1987  . Fracture left humerus  2009    no surgery. Torn rotator cuff also  . Cardiac catheterization  05/29/2014    a. MI 05/29/14 dRCA 100%; D1 ~45%, Normal EF __> PCI to RCA  . Percutaneous coronary stent intervention (pci-s)      dRCA - Promus Premie DES 2.5 mm x 12 mm (2.8 mm)    Family History  Problem Relation Age of Onset  . Hypertension Mother   . Diabetes Mother   . Heart disease Mother     stent  . Cancer Father     prostate  . Hypertension Father   . Gout Father   . Heart disease Father     Stent  . Colon cancer Neg Hx   . Stomach cancer Neg Hx     Social History   Social History  . Marital Status: Married    Spouse Name: N/A  . Number of Children: 1  . Years  of Education: N/A   Occupational History  . Control and instrumentation engineer    Social History Main Topics  . Smoking status: Current Some Day Smoker -- 1.00 packs/day    Types: Cigarettes, E-cigarettes    Last Attempt to Quit: 05/29/2014  . Smokeless tobacco: Never Used     Comment: occ e-cigarette--no cigarettes since MI  . Alcohol Use: No  . Drug Use: No  . Sexual Activity: Yes   Other Topics Concern  . Not on file   Social History Narrative   Married - 1 child.   Smokes 1/2 ppd.   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   Review of Systems  Constitutional: Negative for fatigue and unexpected weight change.       Wears seat belt  HENT: Positive for dental problem. Negative for hearing loss and tinnitus.        Still needs to address his bad teeth  Eyes: Negative for visual disturbance.       No diplopia or unilateral vision loss Some night  vision trouble in car-- discussed getting anti glare glasses  Respiratory: Negative for cough, chest tightness and shortness of breath.   Cardiovascular: Negative for chest pain, palpitations and leg swelling.  Gastrointestinal: Negative for nausea, vomiting, abdominal pain, constipation and blood in stool.  Endocrine: Negative for polydipsia and polyuria.  Genitourinary: Negative for urgency and difficulty urinating.       Some ED  Musculoskeletal: Negative for back pain, joint swelling and arthralgias.  Skin: Positive for rash.  Allergic/Immunologic: Negative for environmental allergies and immunocompromised state.  Neurological: Negative for dizziness, syncope, weakness, light-headedness and headaches.  Hematological: Negative for adenopathy. Bruises/bleeds easily.  Psychiatric/Behavioral: Negative for sleep disturbance and dysphoric mood. The patient is not nervous/anxious.        Objective:   Physical Exam  Constitutional: He is oriented to person, place, and time. He appears well-developed and well-nourished.  HENT:  Head: Normocephalic and atraumatic.  Right Ear: External ear normal.  Left Ear: External ear normal.  Mouth/Throat: Oropharynx is clear and moist. No oropharyngeal exudate.  Eyes: Conjunctivae are normal. Pupils are equal, round, and reactive to light.  Neck: Normal range of motion. Neck supple. No thyromegaly present.  Cardiovascular: Normal rate, regular rhythm, normal heart sounds and intact distal pulses.  Exam reveals no gallop.   No murmur heard. Pulmonary/Chest: Effort normal. No respiratory distress. He has no wheezes. He has no rales.  Slightly decreased breath sounds but clear  Abdominal: Soft. There is no tenderness.  Musculoskeletal: He exhibits no edema.  Lymphadenopathy:    He has no cervical adenopathy.  Neurological: He is alert and oriented to person, place, and time.  Skin:  Rash in intertriginous spaces on hands (?psoriatic)  Psychiatric: He  has a normal mood and affect. His behavior is normal.          Assessment & Plan:

## 2015-10-08 ENCOUNTER — Other Ambulatory Visit: Payer: Self-pay | Admitting: Internal Medicine

## 2015-10-08 DIAGNOSIS — Z1159 Encounter for screening for other viral diseases: Secondary | ICD-10-CM

## 2016-02-12 ENCOUNTER — Emergency Department: Payer: 59

## 2016-02-12 ENCOUNTER — Telehealth: Payer: Self-pay | Admitting: Cardiology

## 2016-02-12 ENCOUNTER — Emergency Department
Admission: EM | Admit: 2016-02-12 | Discharge: 2016-02-12 | Disposition: A | Discharge status: Home / Self Care | Payer: 59 | Attending: Emergency Medicine | Admitting: Emergency Medicine

## 2016-02-12 ENCOUNTER — Encounter: Payer: Self-pay | Admitting: Emergency Medicine

## 2016-02-12 DIAGNOSIS — J449 Chronic obstructive pulmonary disease, unspecified: Secondary | ICD-10-CM | POA: Diagnosis not present

## 2016-02-12 DIAGNOSIS — Z955 Presence of coronary angioplasty implant and graft: Secondary | ICD-10-CM | POA: Diagnosis not present

## 2016-02-12 DIAGNOSIS — Z79899 Other long term (current) drug therapy: Secondary | ICD-10-CM | POA: Insufficient documentation

## 2016-02-12 DIAGNOSIS — R0789 Other chest pain: Secondary | ICD-10-CM | POA: Diagnosis present

## 2016-02-12 DIAGNOSIS — I252 Old myocardial infarction: Secondary | ICD-10-CM | POA: Diagnosis not present

## 2016-02-12 DIAGNOSIS — Z7982 Long term (current) use of aspirin: Secondary | ICD-10-CM | POA: Insufficient documentation

## 2016-02-12 DIAGNOSIS — F17210 Nicotine dependence, cigarettes, uncomplicated: Secondary | ICD-10-CM | POA: Insufficient documentation

## 2016-02-12 LAB — CBC
HCT: 38.9 % — ABNORMAL LOW (ref 40.0–52.0)
HEMOGLOBIN: 13.3 g/dL (ref 13.0–18.0)
MCH: 29 pg (ref 26.0–34.0)
MCHC: 34.3 g/dL (ref 32.0–36.0)
MCV: 84.7 fL (ref 80.0–100.0)
PLATELETS: 266 10*3/uL (ref 150–440)
RBC: 4.59 MIL/uL (ref 4.40–5.90)
RDW: 13.3 % (ref 11.5–14.5)
WBC: 5.3 10*3/uL (ref 3.8–10.6)

## 2016-02-12 LAB — BASIC METABOLIC PANEL
Anion gap: 8 (ref 5–15)
BUN: 6 mg/dL (ref 6–20)
CALCIUM: 8.7 mg/dL — AB (ref 8.9–10.3)
CO2: 26 mmol/L (ref 22–32)
CREATININE: 0.87 mg/dL (ref 0.61–1.24)
Chloride: 100 mmol/L — ABNORMAL LOW (ref 101–111)
GFR calc Af Amer: >60 mL/min (ref >60)
GFR calc non Af Amer: >60 mL/min (ref >60)
GLUCOSE: 128 mg/dL — AB (ref 65–99)
Potassium: 3.8 mmol/L (ref 3.5–5.1)
Sodium: 134 mmol/L — ABNORMAL LOW (ref 135–145)

## 2016-02-12 LAB — TROPONIN I: Troponin I: <0.03 ng/mL (ref <0.031)

## 2016-02-12 MED ORDER — ASPIRIN 81 MG PO CHEW
324.0000 mg | CHEWABLE_TABLET | Freq: Once | ORAL | Status: AC
  Administered 2016-02-12: 324 mg via ORAL
  Filled 2016-02-12: qty 4

## 2016-02-12 MED ORDER — FAMOTIDINE 20 MG PO TABS
40.0000 mg | ORAL_TABLET | Freq: Once | ORAL | Status: AC
  Administered 2016-02-12: 40 mg via ORAL
  Filled 2016-02-12: qty 2

## 2016-02-12 MED ORDER — GI COCKTAIL ~~LOC~~
30.0000 mL | ORAL | Status: AC
  Administered 2016-02-12: 30 mL via ORAL
  Filled 2016-02-12: qty 30

## 2016-02-12 MED ORDER — FAMOTIDINE 20 MG PO TABS: 20.0000 mg | ORAL_TABLET | Freq: Two times a day (BID) | ORAL | Status: DC

## 2016-02-12 NOTE — ED Notes (Signed)
Patient presents to the ED stating, "I feel like there's a knot in my chest."  Patient is not diaphoretic or light headed but does report feeling anxious.  Patient has history of MI.  Patient states his doctor sent him to the ED.

## 2016-02-12 NOTE — ED Provider Notes (Signed)
[facility] Emergency Department Provider Note  ____________________________________________  Time seen: 12:40 PM  I have reviewed the triage vital signs and the nursing notes.   HISTORY  Chief Complaint Chest Pain    HPI Ryan Cantrell is a 55 y.o. male who complains of chest pain that he first noticed when waking up this morning at about 5:45 AM. Been constant since then but waxing and waning. It feels like a knot or tightness in the left lower chest. Not associated with any shortness of breath diaphoresis vomiting or radiation. He has a history of heart attack, and this does not feel similar. He is compliant with aspirin Plavix and metoprolol therapy. No exertional symptoms, not pleuritic. He has not eaten today and thinks that may be contributory.       Past Medical History  Diagnosis Date  . COPD (chronic obstructive pulmonary disease) (HCC)      PFTs 2013.. Mod severe COPD...   . Emphysema   . ST elevation myocardial infarction (STEMI) of inferior wall, initial episode of care (HCC) 05/29/2014    Echocardiogram 05/30/2014: EF 55-60%. Mild HK of the basal inferior wall. Normal valves  . CAD S/P percutaneous coronary angioplasty: Inf STEMI - 100% dRCA - PCI Promus P 2.5 mm x 12mm (2.68mm) 05/29/2014    a. MI 05/29/14 dRCA PCI - Promus Premier DES 2.5 mm x 12 mm (2.8 mm); b. Echo: EF 55-60%, mild basal-inferior HK, normal DD     Patient Active Problem List   Diagnosis Date Noted  . Rash of hands 08/31/2015  . Eczema 07/21/2014  . Pulmonary emphysema (HCC)   . Low HDL (under 40) 06/24/2014  . Borderline systolic hypertension 06/24/2014  . ST elevation myocardial infarction (STEMI) of inferior wall, subsequent episode of care [facility]) 05/29/2014    Class: Hospitalized for  . CAD S/P percutaneous coronary angioplasty: Inf STEMI - 100% dRCA - PCI Promus P 2.5 mm x 12mm (2.72mm) 05/29/2014    Class: Hospitalized for  . Former cigarette smoker 09/24/2012  .  Routine general medical examination at a health care facility 06/22/2012  . Moderate  severe COPD (chronic obstructive pulmonary disease) 06/22/2012     Past Surgical History  Procedure Laterality Date  . Inguinal hernia repair      twice on right and once on left-- 1980's-90's  . Right shoulder surgery  1987  . Fracture left humerus  2009    no surgery. Torn rotator cuff also  . Cardiac catheterization  05/29/2014    a. MI 05/29/14 dRCA 100%; D1 ~45%, Normal EF __> PCI to RCA  . Percutaneous coronary stent intervention (pci-s)      dRCA - Promus Premie DES 2.5 mm x 12 mm (2.8 mm)     Current Outpatient Rx  Name  Route  Sig  Dispense  Refill  . acetaminophen (TYLENOL) 500 MG tablet   Oral   Take 1,000 mg by mouth 2 (two) times daily as needed for mild pain or moderate pain.          Marland Kitchen albuterol (PROAIR HFA) 108 (90 BASE) MCG/ACT inhaler   Inhalation   Inhale 2 puffs into the lungs daily as needed for wheezing or shortness of breath.   1 Inhaler   1   . aspirin EC 81 MG tablet   Oral   Take 81 mg by mouth daily.         Marland Kitchen atorvastatin (LIPITOR) 20 MG tablet   Oral  Take 1 tablet (20 mg total) by mouth daily. Patient taking differently: Take 20 mg by mouth at bedtime.    90 tablet   2   . clopidogrel (PLAVIX) 75 MG tablet   Oral   Take 1 tablet (75 mg total) by mouth daily.   90 tablet   2   . metoprolol succinate (TOPROL-XL) 25 MG 24 hr tablet   Oral   Take 1 tablet (25 mg total) by mouth daily.   90 tablet   2   . nitroGLYCERIN (NITROSTAT) 0.4 MG SL tablet   Sublingual   Place 1 tablet (0.4 mg total) under the tongue every 5 (five) minutes as needed for chest pain.   25 tablet   3   . triamcinolone cream (KENALOG) 0.1 %   Topical   Apply 1 application topically 2 (two) times daily as needed. Patient taking differently: Apply 1 application topically 2 (two) times daily as needed (for irritation).    45 g   1      Allergies Codeine  sulfate   Family History  Problem Relation Age of Onset  . Hypertension Mother   . Diabetes Mother   . Heart disease Mother     stent  . Cancer Father     prostate  . Hypertension Father   . Gout Father   . Heart disease Father     Stent  . Colon cancer Neg Hx   . Stomach cancer Neg Hx     Social History Social History  Substance Use Topics  . Smoking status: Current Some Day Smoker -- 1.00 packs/day    Types: Cigarettes, E-cigarettes    Last Attempt to Quit: 05/29/2014  . Smokeless tobacco: Never Used     Comment: occ e-cigarette--no cigarettes since MI  . Alcohol Use: No    Review of Systems  Constitutional:   No fever or chills.  Eyes:   No vision changes.  ENT:   No sore throat. No rhinorrhea. Cardiovascular:   Positive chest pain. Respiratory:   No dyspnea or cough. Gastrointestinal:   Negative for abdominal pain, vomiting and diarrhea.  Genitourinary:   Negative for dysuria or difficulty urinating. Musculoskeletal:   Negative for focal pain or swelling Neurological:   Negative for headaches 10-point ROS otherwise negative.  ____________________________________________   PHYSICAL EXAM:  VITAL SIGNS: ED Triage Vitals  Enc Vitals Group     BP 02/12/16 0940 155/83 mmHg     Pulse Rate 02/12/16 0940 57     Resp 02/12/16 0940 18     Temp 02/12/16 0940 98.3 F (36.8 C)     Temp Source 02/12/16 0940 Oral     SpO2 02/12/16 0940 100 %     Weight 02/12/16 0940 156 lb (70.761 kg)     Height 02/12/16 0940 5\' 11"  (1.803 m)     Head Cir --      Peak Flow --      Pain Score 02/12/16 0941 2     Pain Loc --      Pain Edu? --      Excl. in GC? --     Vital signs reviewed, nursing assessments reviewed.   Constitutional:   Alert and oriented. Well appearing and in no distress. Eyes:   No scleral icterus. No conjunctival pallor. PERRL. EOMI.  No nystagmus. ENT   Head:   Normocephalic and atraumatic.   Nose:   No congestion/rhinnorhea. No septal  hematoma   Mouth/Throat:   MMM,  no pharyngeal erythema. No peritonsillar mass.    Neck:   No stridor. No SubQ emphysema. No meningismus. Hematological/Lymphatic/Immunilogical:   No cervical lymphadenopathy. Cardiovascular:   RRR. Symmetric bilateral radial and DP pulses.  No murmurs.  Respiratory:   Normal respiratory effort without tachypnea nor retractions. Breath sounds are clear and equal bilaterally. No wheezes/rales/rhonchi. Gastrointestinal:   Soft and nontender. Non distended. There is no CVA tenderness.  No rebound, rigidity, or guarding. Genitourinary:   deferred Musculoskeletal:   Nontender with normal range of motion in all extremities. No joint effusions.  No lower extremity tenderness.  No edema. Chest wall nontender Neurologic:   Normal speech and language.  CN 2-10 normal. Motor grossly intact. No gross focal neurologic deficits are appreciated.  Skin:    Skin is warm, dry and intact. No rash noted.  No petechiae, purpura, or bullae.  ____________________________________________    LABS (pertinent positives/negatives) (all labs ordered are listed, but only abnormal results are displayed) Labs Reviewed  BASIC METABOLIC PANEL - Abnormal; Notable for the following:    Sodium 134 (*)    Chloride 100 (*)    Glucose, Bld 128 (*)    Calcium 8.7 (*)    All other components within normal limits  CBC - Abnormal; Notable for the following:    HCT 38.9 (*)    All other components within normal limits  TROPONIN I  TROPONIN I   ____________________________________________   EKG  Interpreted by me Normal sinus rhythm rate of 63, normal axis and intervals. Normal QRS ST segments and T waves.  ____________________________________________    RADIOLOGY  Chest x-ray unremarkable, underlying emphysema  ____________________________________________   PROCEDURES   ____________________________________________   INITIAL IMPRESSION / ASSESSMENT AND PLAN / ED  COURSE  Pertinent labs & imaging results that were available during my care of the patient were reviewed by me and considered in my medical decision making (see chart for details).  Patient well appearing no acute distress, presents with atypical chest pain. Not consistent with angina. Low suspicion for ACS.Considering the patient's symptoms, medical history, and physical examination today, I have low suspicion for ACS, PE, TAD, pneumothorax, carditis, mediastinitis, pneumonia, CHF, or sepsis.  Initial labs unremarkable, EKG unremarkable. We'll check repeat troponin, patient suitable for discharge home if negative. GI cocktail for now.  ----------------------------------------- 3:15 PM on 02/12/2016 -----------------------------------------  Patient continues feeling better. Second troponin negative. The patient continue antacids and follow up with his cardiologist. He reports that he is due for a routine appointment with his cardiologist Dr. Herbie Baltimore anyway, so he will schedule one for this week.     ____________________________________________   FINAL CLINICAL IMPRESSION(S) / ED DIAGNOSES  Final diagnoses:  Atypical chest pain       Portions of this note were generated with dragon dictation software. Dictation errors may occur despite best attempts at proofreading.   Sharman Cheek, MD 02/12/16 [phone]

## 2016-02-12 NOTE — Discharge Instructions (Signed)
Nonspecific Chest Pain  °Chest pain can be caused by many different conditions. There is always a chance that your pain could be related to something serious, such as a heart attack or a blood clot in your lungs. Chest pain can also be caused by conditions that are not life-threatening. If you have chest pain, it is very important to follow up with your health care provider. °CAUSES  °Chest pain can be caused by: °· Heartburn. °· Pneumonia or bronchitis. °· Anxiety or stress. °· Inflammation around your heart (pericarditis) or lung (pleuritis or pleurisy). °· A blood clot in your lung. °· A collapsed lung (pneumothorax). It can develop suddenly on its own (spontaneous pneumothorax) or from trauma to the chest. °· Shingles infection (varicella-zoster virus). °· Heart attack. °· Damage to the bones, muscles, and cartilage that make up your chest wall. This can include: °¨ Bruised bones due to injury. °¨ Strained muscles or cartilage due to frequent or repeated coughing or overwork. °¨ Fracture to one or more ribs. °¨ Sore cartilage due to inflammation (costochondritis). °RISK FACTORS  °Risk factors for chest pain may include: °· Activities that increase your risk for trauma or injury to your chest. °· Respiratory infections or conditions that cause frequent coughing. °· Medical conditions or overeating that can cause heartburn. °· Heart disease or family history of heart disease. °· Conditions or health behaviors that increase your risk of developing a blood clot. °· Having had chicken pox (varicella zoster). °SIGNS AND SYMPTOMS °Chest pain can feel like: °· Burning or tingling on the surface of your chest or deep in your chest. °· Crushing, pressure, aching, or squeezing pain. °· Dull or sharp pain that is worse when you move, cough, or take a deep breath. °· Pain that is also felt in your back, neck, shoulder, or arm, or pain that spreads to any of these areas. °Your chest pain may come and go, or it may stay  constant. °DIAGNOSIS °Lab tests or other studies may be needed to find the cause of your pain. Your health care provider may have you take a test called an ambulatory ECG (electrocardiogram). An ECG records your heartbeat patterns at the time the test is performed. You may also have other tests, such as: °· Transthoracic echocardiogram (TTE). During echocardiography, sound waves are used to create a picture of all of the heart structures and to look at how blood flows through your heart. °· Transesophageal echocardiogram (TEE). This is a more advanced imaging test that obtains images from inside your body. It allows your health care provider to see your heart in finer detail. °· Cardiac monitoring. This allows your health care provider to monitor your heart rate and rhythm in real time. °· Holter monitor. This is a portable device that records your heartbeat and can help to diagnose abnormal heartbeats. It allows your health care provider to track your heart activity for several days, if needed. °· Stress tests. These can be done through exercise or by taking medicine that makes your heart beat more quickly. °· Blood tests. °· Imaging tests. °TREATMENT  °Your treatment depends on what is causing your chest pain. Treatment may include: °· Medicines. These may include: °¨ Acid blockers for heartburn. °¨ Anti-inflammatory medicine. °¨ Pain medicine for inflammatory conditions. °¨ Antibiotic medicine, if an infection is present. °¨ Medicines to dissolve blood clots. °¨ Medicines to treat coronary artery disease. °· Supportive care for conditions that do not require medicines. This may include: °¨ Resting. °¨ Applying heat   or cold packs to injured areas. °¨ Limiting activities until pain decreases. °HOME CARE INSTRUCTIONS °· If you were prescribed an antibiotic medicine, finish it all even if you start to feel better. °· Avoid any activities that bring on chest pain. °· Do not use any tobacco products, including  cigarettes, chewing tobacco, or electronic cigarettes. If you need help quitting, ask your health care provider. °· Do not drink alcohol. °· Take medicines only as directed by your health care provider. °· Keep all follow-up visits as directed by your health care provider. This is important. This includes any further testing if your chest pain does not go away. °· If heartburn is the cause for your chest pain, you may be told to keep your head raised (elevated) while sleeping. This reduces the chance that acid will go from your stomach into your esophagus. °· Make lifestyle changes as directed by your health care provider. These may include: °¨ Getting regular exercise. Ask your health care provider to suggest some activities that are safe for you. °¨ Eating a heart-healthy diet. A registered dietitian can help you to learn healthy eating options. °¨ Maintaining a healthy weight. °¨ Managing diabetes, if necessary. °¨ Reducing stress. °SEEK MEDICAL CARE IF: °· Your chest pain does not go away after treatment. °· You have a rash with blisters on your chest. °· You have a fever. °SEEK IMMEDIATE MEDICAL CARE IF:  °· Your chest pain is worse. °· You have an increasing cough, or you cough up blood. °· You have severe abdominal pain. °· You have severe weakness. °· You faint. °· You have chills. °· You have sudden, unexplained chest discomfort. °· You have sudden, unexplained discomfort in your arms, back, neck, or jaw. °· You have shortness of breath at any time. °· You suddenly start to sweat, or your skin gets clammy. °· You feel nauseous or you vomit. °· You suddenly feel light-headed or dizzy. °· Your heart begins to beat quickly, or it feels like it is skipping beats. °These symptoms may represent a serious problem that is an emergency. Do not wait to see if the symptoms will go away. Get medical help right away. Call your local emergency services (911 in the [place] Do not drive yourself to the hospital. °  °This  information is not intended to replace advice given to you by your health care provider. Make sure you discuss any questions you have with your health care provider. °  °Document Released: 05/29/2005 Document Revised: 09/09/2014 Document Reviewed: 03/25/2014 °Elsevier Interactive Patient Education ©2016 [employer] ° °

## 2016-02-12 NOTE — Telephone Encounter (Signed)
New message     Per wife the pt called his wife the pt is not feeling well, slight discomfort in the chest .   Pt c/o of Chest Pain: STAT if CP now or developed within 24 hours  1. Are you having CP right now? no  2. Are you experiencing any other symptoms (ex. SOB, nausea, vomiting, sweating)? Per wife states the pt did not say, the wife says no  3. How long have you been experiencing CP? Chest discomfort just this am  4. Is your CP continuous or coming and going? Per wife states she didn't ask 5. Have you taken Nitroglycerin? no ?

## 2016-02-12 NOTE — Telephone Encounter (Signed)
Spoke to patient States knot in his chest  Left side bottom of rib cage , shaky  And weak Has not used NTG No chest pressure, patient states he does not know if any thing wrong."I think my nerves have got the best me" Patient states he has taken medication today   RN INFORMED PATIENT SINCE HE HAS ACTIVE DISCOMFORT. RECOMMEND GOING TO CLOSET ER. Patient states he is on his way to ER now, RN asked which one? Patient states he is going to [facility]. RN reassured patient that it was the best to go.

## 2016-02-28 ENCOUNTER — Ambulatory Visit (INDEPENDENT_AMBULATORY_CARE_PROVIDER_SITE_OTHER): Payer: 59 | Admitting: Cardiology

## 2016-02-28 ENCOUNTER — Encounter: Payer: Self-pay | Admitting: Cardiology

## 2016-02-28 VITALS — BP 110/78 | HR 60 | Ht 71.0 in | Wt 157.2 lb

## 2016-02-28 DIAGNOSIS — R079 Chest pain, unspecified: Secondary | ICD-10-CM | POA: Diagnosis not present

## 2016-02-28 DIAGNOSIS — I2119 ST elevation (STEMI) myocardial infarction involving other coronary artery of inferior wall: Secondary | ICD-10-CM | POA: Diagnosis not present

## 2016-02-28 DIAGNOSIS — Z87891 Personal history of nicotine dependence: Secondary | ICD-10-CM

## 2016-02-28 DIAGNOSIS — I2510 Atherosclerotic heart disease of native coronary artery without angina pectoris: Secondary | ICD-10-CM | POA: Diagnosis not present

## 2016-02-28 DIAGNOSIS — E786 Lipoprotein deficiency: Secondary | ICD-10-CM

## 2016-02-28 DIAGNOSIS — R030 Elevated blood-pressure reading, without diagnosis of hypertension: Secondary | ICD-10-CM

## 2016-02-28 DIAGNOSIS — Z9861 Coronary angioplasty status: Secondary | ICD-10-CM

## 2016-02-28 NOTE — Assessment & Plan Note (Signed)
Still Vaping - no cigarettes -> is trying to wean to 0% from 3%.

## 2016-02-28 NOTE — Progress Notes (Signed)
PCP: Tillman Abide, MD  Clinic Note: Chief Complaint  Patient presents with  . Other    1 yr f/u c/o pt went to ED due to discomfort 02/13/16 and believes was due to GI problem no complaints today. Meds reviewed verbally with pt.  . Coronary Artery Disease    History of inferior STEMI, PCI to RCA    HPI: JAGJIT RINER is a 55 y.o. male with a PMH below who presents today for essentially annual follow-up for CAD-PCI. He is an inferior STEMI in September 2015, with an occluded RCA treated PCI.   JEFFREN DOMBEK was last seen on I last saw him in August 2016, he is doing quite well. He denied any further twinging sensation in his chest.   Recent Hospitalizations: He went to the emergency room on June 12 for chest discomfort. He had knot in his chest in the left side is left lower rib cage. He felt shaky and weak. He earlyinthemorning,notassociatedwithanydyspneaoretc.  Studies Reviewed: none  Interval History: [name] presents for early annual follow-up is because of his recent emergency room visit. He basically describes having an episode of prolonged discomfort up underneath his left rib cage/almost in the right upper quadrant. This happened in the morning, but he just didn't feel right so he therefore went to the emergency room after talking with our office. His evaluation was negative, he has not had any further symptoms. It got better after some Prilosec. He otherwise is doing very well. He's not exercising like he used to, but is definitely planning on it once they get their home gym set up. They are just waiting to move there exercise treadmill into the room vacated by their son. Since the one episode he had is not had any further chest tightness or pressure with rest or exertion. Of note his anginal equivalent was really almost jaw pain radiating to the left shoulder and not so much chest pain. He's had no heart failure symptoms o PND, orthopnea or edema.   No palpitations,  lightheadedness, dizziness, weakness or syncope/near syncope. No TIA/amaurosis fugax symptoms. No melena, hematochezia, hematuria, or epstaxis. No claudication.  He has not fully quit smoking in the sense that he still does Vaping using 3% solution.  He has not had a cigarette since his heart attack.  ROS: A comprehensive was performed. Review of Systems  Constitutional: Negative for fever, chills and malaise/fatigue.  HENT: Negative for congestion and nosebleeds.        Will likely need to have dental work done shortly. Was asking about Plavix  Respiratory: Positive for cough (Much less frequent the further out he gets from smoking). Negative for shortness of breath and wheezing.   Cardiovascular: Positive for chest pain (See history of present illness).  Gastrointestinal: Positive for heartburn.  Genitourinary: Negative for dysuria and hematuria.  Musculoskeletal: Negative for myalgias, joint pain and neck pain.  Neurological: Negative for dizziness, loss of consciousness and headaches.  Endo/Heme/Allergies: Bruises/bleeds easily.  Psychiatric/Behavioral: Negative for depression and memory loss. The patient is not nervous/anxious and does not have insomnia.   All other systems reviewed and are negative.   Past Medical History  Diagnosis Date  . COPD (chronic obstructive pulmonary disease) (HCC)      PFTs 2013.. Mod severe COPD...   . Emphysema   . ST elevation myocardial infarction (STEMI) of inferior wall, initial episode of care (HCC) 05/29/2014    Echocardiogram 05/30/2014: EF 55-60%. Mild HK of the basal inferior wall. Normal  valves  . CAD S/P percutaneous coronary angioplasty: Inf STEMI - 100% dRCA - PCI Promus P 2.5 mm x 12mm (2.928mm) 05/29/2014    a. MI 05/29/14 dRCA PCI - Promus Premier DES 2.5 mm x 12 mm (2.8 mm); b. Echo: EF 55-60%, mild basal-inferior HK, normal DD    Past Surgical History  Procedure Laterality Date  . Inguinal hernia repair      twice on right and once  on left-- 1980's-90's  . Right shoulder surgery  1987  . Fracture left humerus  2009    no surgery. Torn rotator cuff also  . Cardiac catheterization  05/29/2014    a. MI 05/29/14 dRCA 100%; D1 ~45%, Normal EF __> PCI to RCA  . Percutaneous coronary stent intervention (pci-s)      dRCA - Promus Premie DES 2.5 mm x 12 mm (2.8 mm)    Prior to Admission medications   Medication Sig Start Date End Date Taking? Authorizing Provider  acetaminophen (TYLENOL) 500 MG tablet Take 1,000 mg by mouth 2 (two) times daily as needed for mild pain or moderate pain.    Yes Historical Provider, MD  albuterol (PROAIR HFA) 108 (90 BASE) MCG/ACT inhaler Inhale 2 puffs into the lungs daily as needed for wheezing or shortness of breath. 07/17/15  Yes Karie Schwalbeichard I Letvak, MD  aspirin EC 81 MG tablet Take 81 mg by mouth daily.   Yes Historical Provider, MD  atorvastatin (LIPITOR) 20 MG tablet Take 1 tablet (20 mg total) by mouth daily. Patient taking differently: Take 20 mg by mouth at bedtime.  08/02/15  Yes Marykay Lexavid W Lamorris Knoblock, MD  clopidogrel (PLAVIX) 75 MG tablet Take 1 tablet (75 mg total) by mouth daily. 08/02/15  Yes Marykay Lexavid W Nautika Cressey, MD  metoprolol succinate (TOPROL-XL) 25 MG 24 hr tablet Take 1 tablet (25 mg total) by mouth daily. 08/02/15  Yes Marykay Lexavid W Thressa Shiffer, MD  nitroGLYCERIN (NITROSTAT) 0.4 MG SL tablet Place 1 tablet (0.4 mg total) under the tongue every 5 (five) minutes as needed for chest pain. 05/31/14  Yes Rhonda G Barrett, PA-C  triamcinolone cream (KENALOG) 0.1 % Apply 1 application topically 2 (two) times daily as needed. Patient taking differently: Apply 1 application topically 2 (two) times daily as needed (for irritation).  10/06/15  Yes Karie Schwalbeichard I Letvak, MD    Allergies  Allergen Reactions  . Codeine Sulfate Nausea And Vomiting    Social History   Social History  . Marital Status: Married    Spouse Name: N/A  . Number of Children: 1  . Years of Education: N/A   Occupational History  . Academic librarianress  operator    Social History Main Topics  . Smoking status: Current Some Day Smoker -- 1.00 packs/day    Types: Cigarettes, E-cigarettes    Last Attempt to Quit: 05/29/2014  . Smokeless tobacco: Never Used     Comment: occ e-cigarette--no cigarettes since MI  . Alcohol Use: No  . Drug Use: No  . Sexual Activity: Yes   Other Topics Concern  . None   Social History Narrative   Married - 1 child.   Smokes 1/2 ppd.   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    family history includes Cancer in his father; Diabetes in his mother; Gout in his father; Heart disease in his father and mother; Hypertension in his father and mother. There is no history of Colon cancer or Stomach cancer.   Wt Readings from Last  3 Encounters:  02/28/16 157 lb 4 oz (71.328 kg)  02/12/16 156 lb (70.761 kg)  10/06/15 157 lb (71.215 kg)    PHYSICAL EXAM BP 110/78 mmHg  Pulse 60  Ht 5\' 11"  (1.803 m)  Wt 157 lb 4 oz (71.328 kg)  BMI 21.94 kg/m2 General appearance: alert, cooperative, appears stated age, no distress and well nourished Neck: no adenopathy, no carotid bruit and no JVD Lungs: CTAB, normal percussion bilaterally and non-labored Heart: RRR, S1& S2 normal, no murmur, click, rub or gallop Abdomen: soft, non-tender; bowel sounds normal; no masses, no organomegaly;  Extremities: extremities normal, atraumatic, no cyanosis,or edema; Mild bruising noted on the arms and hands. Pulses: 2+ and symmetric; right radial pulse is somewhat weak but palpable Skin: normal or tattoos Neurologic: Mental status: Alert, oriented, thought content appropriate Cranial nerves: normal (II-XII grossly intact)   Adult ECG Report Not checked  Other studies Reviewed: Additional studies/ records that were reviewed today include:  Recent Labs:    Lab Results  Component Value Date   CHOL 97 10/06/2015   HDL 35.10* 10/06/2015   LDLCALC 41 10/06/2015   TRIG 106.0 10/06/2015   CHOLHDL 3  10/06/2015     ASSESSMENT / PLAN: Problem List Items Addressed This Visit    ST elevation myocardial infarction (STEMI) of inferior wall, subsequent episode of care [facility](HCC) (Chronic)    He is doing fairly well now. Still has a little bit of the nervousness and anxiousness about having had a heart attack. His wife is very hands on. He is not had any anginal type symptoms with rest or exertion and no heart failure symptoms. He is on stable medications.      Low HDL (under 40) (Chronic)    HDL is 35 to visit the setting of pretty low total cholesterol. I think were fine keeping him on 20 mg of Lipitor.      Former cigarette smoker (Chronic)    Still Vaping - no cigarettes -> is trying to wean to 0% from 3%.      Chest pain with low risk for cardiac etiology    I would suspect that the episode he had to take him to the emergency room was probably GERD related. Has not had any recurrence either with rest or exertion. Continue current medications, recommended using PPI or H2 blocker for symptomatic treatment.      CAD S/P percutaneous coronary angioplasty: Inf STEMI - 100% dRCA - PCI Promus P 2.5 mm x 12mm (2.278mm) - Primary (Chronic)    He is now almost 2 years out from his inferior STEMI with DES to the RCA. Currently on aspirin plus Plavix. We can have him stop aspirin now, and only used aspirin in the low Plavix if that needs to be held for procedures. On statin and stable dose. Really on low-dose Toprol with no active symptoms.      Relevant Orders   Lipid Profile   Hepatic function panel   Borderline systolic hypertension (Chronic)    His blood pressure extremely well controlled on low-dose Toprol.         Current medicines are reviewed at length with the patient today. (+/- concerns) ? Blood thinners?" The following changes have been made:   Medication Instructions:  Your physician has recommended you make the following change in your medication:  STOP taking  aspirin   Labwork: Lipid and liver profile in February 2018. Nothing to eat or drink after midnight the evening before your labs.  Studies Ordered:   Orders Placed This Encounter  Procedures  . Lipid Profile  . Hepatic function panel      Bryan Lemma, M.D., M.S. Interventional Cardiologist   Pager # [phone] Phone # [phone] [address] [place], [place] [address]

## 2016-02-28 NOTE — Assessment & Plan Note (Signed)
He is doing fairly well now. Still has a little bit of the nervousness and anxiousness about having had a heart attack. His wife is very hands on. He is not had any anginal type symptoms with rest or exertion and no heart failure symptoms. He is on stable medications.

## 2016-02-28 NOTE — Assessment & Plan Note (Signed)
His blood pressure extremely well controlled on low-dose Toprol.

## 2016-02-28 NOTE — Assessment & Plan Note (Signed)
He is now almost 2 years out from his inferior STEMI with DES to the RCA. Currently on aspirin plus Plavix. We can have him stop aspirin now, and only used aspirin in the low Plavix if that needs to be held for procedures. On statin and stable dose. Really on low-dose Toprol with no active symptoms.

## 2016-02-28 NOTE — Assessment & Plan Note (Signed)
I would suspect that the episode he had to take him to the emergency room was probably GERD related. Has not had any recurrence either with rest or exertion. Continue current medications, recommended using PPI or H2 blocker for symptomatic treatment.

## 2016-02-28 NOTE — Assessment & Plan Note (Addendum)
HDL is 35 to visit the setting of pretty low total cholesterol. I think were fine keeping him on 20 mg of Lipitor. We'll recheck lipid panel and CMP in February 2018, which is one year out from last check.

## 2016-02-28 NOTE — Patient Instructions (Signed)
Medication Instructions:  Your physician has recommended you make the following change in your medication:  STOP taking aspirin   Labwork: Lipid and liver profile in February 2018. Nothing to eat or drink after midnight the evening before your labs.   Testing/Procedures: none  Follow-Up: Your physician wants you to follow-up in: one year with Dr. Herbie BaltimoreHarding.  You will receive a reminder letter in the mail two months in advance. If you don't receive a letter, please call our office to schedule the follow-up appointment.   Any Other Special Instructions Will Be Listed Below (If Applicable).     If you need a refill on your cardiac medications before your next appointment, please call your pharmacy.

## 2016-03-19 ENCOUNTER — Other Ambulatory Visit: Payer: Self-pay | Admitting: Cardiology

## 2016-05-22 ENCOUNTER — Other Ambulatory Visit: Payer: Self-pay | Admitting: Cardiology

## 2016-05-23 NOTE — Telephone Encounter (Signed)
Rx(s) sent to pharmacy electronically.  

## 2016-07-18 ENCOUNTER — Encounter: Payer: Self-pay | Admitting: Internal Medicine

## 2016-08-19 ENCOUNTER — Ambulatory Visit: Payer: 59 | Admitting: Internal Medicine

## 2016-08-19 ENCOUNTER — Telehealth: Payer: Self-pay | Admitting: Family

## 2016-08-19 ENCOUNTER — Encounter: Payer: Self-pay | Admitting: Family

## 2016-08-19 ENCOUNTER — Ambulatory Visit (INDEPENDENT_AMBULATORY_CARE_PROVIDER_SITE_OTHER): Payer: 59 | Admitting: Family

## 2016-08-19 ENCOUNTER — Telehealth: Payer: Self-pay | Admitting: Internal Medicine

## 2016-08-19 ENCOUNTER — Ambulatory Visit (INDEPENDENT_AMBULATORY_CARE_PROVIDER_SITE_OTHER): Payer: 59

## 2016-08-19 VITALS — BP 138/84 | HR 62 | Temp 98.6°F | Resp 16 | Wt 153.1 lb

## 2016-08-19 DIAGNOSIS — M79645 Pain in left finger(s): Secondary | ICD-10-CM | POA: Diagnosis not present

## 2016-08-19 LAB — CBC WITH DIFFERENTIAL/PLATELET
Basophils Absolute: 0.1 10*3/uL (ref 0.0–0.1)
Basophils Relative: 1.1 % (ref 0.0–3.0)
Eosinophils Absolute: 0 10*3/uL (ref 0.0–0.7)
Eosinophils Relative: 0.4 % (ref 0.0–5.0)
HEMATOCRIT: 41.2 % (ref 39.0–52.0)
Hemoglobin: 13.9 g/dL (ref 13.0–17.0)
LYMPHS PCT: 14.9 % (ref 12.0–46.0)
Lymphs Abs: 1.1 10*3/uL (ref 0.7–4.0)
MCHC: 33.7 g/dL (ref 30.0–36.0)
MCV: 85.7 fl (ref 78.0–100.0)
Monocytes Absolute: 0.7 10*3/uL (ref 0.1–1.0)
Monocytes Relative: 9.7 % (ref 3.0–12.0)
NEUTROS ABS: 5.5 10*3/uL (ref 1.4–7.7)
NEUTROS PCT: 73.9 % (ref 43.0–77.0)
PLATELETS: 333 10*3/uL (ref 150.0–400.0)
RBC: 4.8 Mil/uL (ref 4.22–5.81)
RDW: 13.4 % (ref 11.5–15.5)
WBC: 7.4 10*3/uL (ref 4.0–10.5)

## 2016-08-19 LAB — COMPREHENSIVE METABOLIC PANEL
ALT: 10 U/L (ref 0–53)
AST: 14 U/L (ref 0–37)
Albumin: 4.8 g/dL (ref 3.5–5.2)
Alkaline Phosphatase: 59 U/L (ref 39–117)
BILIRUBIN TOTAL: 1.9 mg/dL — AB (ref 0.2–1.2)
BUN: 7 mg/dL (ref 6–23)
CALCIUM: 9.5 mg/dL (ref 8.4–10.5)
CO2: 31 meq/L (ref 19–32)
CREATININE: 0.84 mg/dL (ref 0.40–1.50)
Chloride: 96 mEq/L (ref 96–112)
GFR: 100.93 mL/min (ref >60.00)
GLUCOSE: 113 mg/dL — AB (ref 70–99)
Potassium: 4.2 mEq/L (ref 3.5–5.1)
Sodium: 133 mEq/L — ABNORMAL LOW (ref 135–145)
Total Protein: 7.5 g/dL (ref 6.0–8.3)

## 2016-08-19 LAB — C-REACTIVE PROTEIN: CRP: 1.4 mg/dL (ref 0.5–20.0)

## 2016-08-19 LAB — SEDIMENTATION RATE: Sed Rate: 3 mm/hr (ref 0–20)

## 2016-08-19 NOTE — Progress Notes (Signed)
Pre visit review using our clinic review tool, if applicable. No additional management support is needed unless otherwise documented below in the visit note. 

## 2016-08-19 NOTE — Patient Instructions (Signed)
Pending labs, xray  Please have cell phone on you so we can call this afternoon

## 2016-08-19 NOTE — Telephone Encounter (Signed)
Pt already been seen by Phoebe PerchM Arnett FNP.

## 2016-08-19 NOTE — Telephone Encounter (Signed)
Patient Name: Ryan Cantrell  DOB: April 10, 1961    Initial Comment Caller states, he is a heart patient- his left hand and arm is swollen red inflamed, no numbness has tingling and painful. No heart issues. Verified    Nurse Assessment  Nurse: Vickey SagesAtkins, RN, Jacquilin Date/Time (Eastern Time): 08/19/2016 8:25:23 AM  Confirm and document reason for call. If symptomatic, describe symptoms. ---Caller states he has left thumb pain/ tingling. Patient states he has stiffness in the area with redness. Patient states he has pain in the wrist but does not go above the wrist. Denies any chest pain/ SOB or Neuro symptoms.  Does the patient have any new or worsening symptoms? ---Yes  Will a triage be completed? ---Yes  Related visit to physician within the last 2 weeks? ---No  Does the PT have any chronic conditions? (i.e. diabetes, asthma, etc.) ---Yes  List chronic conditions. ---mi, stent placement and copd  Is this a behavioral health or substance abuse call? ---No     Guidelines    Guideline Title Affirmed Question Affirmed Notes  Neurologic Deficit [1] Tingling (e.g., pins and needles) of the face, arm / hand, or leg / foot on one side of the body AND [2] present now    Final Disposition User   See Physician within 4 Hours (or PCP triage) Vickey SagesAtkins, RN, Jacquilin    Comments  No appointments at PCP office. Scheduled with Arnett at 1015.   Referrals  REFERRED TO PCP OFFICE   Disagree/Comply: Comply

## 2016-08-19 NOTE — Telephone Encounter (Signed)
Patient was informed of results.  Patient understood and no questions, comments, or concerns at this time.  

## 2016-08-19 NOTE — Telephone Encounter (Signed)
If he calls back  Left message to discuss results  No fracture on xr of left hand Labs are unrevealing; WBCs normal so unlikely infection ESR and Sed rate normal so unlikely gout Since pain is slightly better today, are you willing to give it another day taking tyelonol, using ice to see if swelling resolves?  If pain, swelling worsens in any way, I want to hear from you ASAP

## 2016-08-19 NOTE — Progress Notes (Signed)
Subjective:    Patient ID: Ryan SquiresMark A Cantrell, male    DOB: 1961-01-07, 55 y.o.   MRN: [id number]  CC: Ryan SquiresMark A Stretch is a 55 y.o. male who presents today for an acute visit.    HPI: CC: swelling, pain in left thumb started yesterday when we woke up, pain improving today. No fever, increased warmth. No h/o gout or CKD. No insect bites. tyelonol helps ( cannot take NSAIDs)  Notes that he hit left thumb with a hammer a couple of months ago.  Denies exertional chest pain or pressure, numbness or tingling radiating to left arm or jaw, palpitations, dizziness, frequent headaches, changes in vision, or shortness of breath.   H/o MI-2015, COPD. On plavix.   No h/o cellutitis, boils, MRSA.        HISTORY:  Past Medical History:  Diagnosis Date  . CAD S/P percutaneous coronary angioplasty: Inf STEMI - 100% dRCA - PCI Promus P 2.5 mm x 12mm (2.138mm) 05/29/2014   a. MI 05/29/14 dRCA PCI - Promus Premier DES 2.5 mm x 12 mm (2.8 mm); b. Echo: EF 55-60%, mild basal-inferior HK, normal DD  . COPD (chronic obstructive pulmonary disease) (HCC)     PFTs 2013.. Mod severe COPD...   . Emphysema   . ST elevation myocardial infarction (STEMI) of inferior wall, initial episode of care (HCC) 05/29/2014   Echocardiogram 05/30/2014: EF 55-60%. Mild HK of the basal inferior wall. Normal valves   Past Surgical History:  Procedure Laterality Date  . CARDIAC CATHETERIZATION  05/29/2014   a. MI 05/29/14 dRCA 100%; D1 ~45%, Normal EF __> PCI to RCA  . Fracture left humerus  2009   no surgery. Torn rotator cuff also  . INGUINAL HERNIA REPAIR     twice on right and once on left-- 1980's-90's  . PERCUTANEOUS CORONARY STENT INTERVENTION (PCI-S)     dRCA - Promus Premie DES 2.5 mm x 12 mm (2.8 mm)  . Right shoulder surgery  1987   Family History  Problem Relation Age of Onset  . Hypertension Mother   . Diabetes Mother   . Heart disease Mother     stent  . Cancer Father     prostate  . Hypertension Father   . Gout  Father   . Heart disease Father     Stent  . Colon cancer Neg Hx   . Stomach cancer Neg Hx     Allergies: Codeine sulfate Current Outpatient Prescriptions on File Prior to Visit  Medication Sig Dispense Refill  . acetaminophen (TYLENOL) 500 MG tablet Take 1,000 mg by mouth 2 (two) times daily as needed for mild pain or moderate pain.     Marland Kitchen. albuterol (PROAIR HFA) 108 (90 BASE) MCG/ACT inhaler Inhale 2 puffs into the lungs daily as needed for wheezing or shortness of breath. 1 Inhaler 1  . metoprolol succinate (TOPROL-XL) 25 MG 24 hr tablet Take 1 tablet by mouth  daily 90 tablet 2  . nitroGLYCERIN (NITROSTAT) 0.4 MG SL tablet Place 1 tablet (0.4 mg total) under the tongue every 5 (five) minutes as needed for chest pain. 25 tablet 3  . atorvastatin (LIPITOR) 20 MG tablet Take 1 tablet (20 mg total) by mouth daily. (Patient not taking: Reported on 08/19/2016) 90 tablet 2  . clopidogrel (PLAVIX) 75 MG tablet Take 1 tablet by mouth  daily (Patient not taking: Reported on 08/19/2016) 90 tablet 2  . triamcinolone cream (KENALOG) 0.1 % Apply 1 application topically 2 (two) times  daily as needed. (Patient not taking: Reported on 08/19/2016) 45 g 1   No current facility-administered medications on file prior to visit.     Social History  Substance Use Topics  . Smoking status: Current Some Day Smoker    Packs/day: 1.00    Types: Cigarettes, E-cigarettes    Last attempt to quit: 05/29/2014  . Smokeless tobacco: Never Used     Comment: occ e-cigarette--no cigarettes since MI  . Alcohol use No    Review of Systems  Constitutional: Negative for chills and fever.  Respiratory: Negative for cough.   Cardiovascular: Negative for chest pain and palpitations.  Gastrointestinal: Negative for nausea and vomiting.  Musculoskeletal: Positive for joint swelling.      Objective:    BP 138/84   Pulse 62   Temp 98.6 F (37 C) (Oral)   Resp 16   Wt 153 lb 2 oz (69.5 kg)   SpO2 98%   BMI 21.36  kg/m    Physical Exam  Constitutional: He appears well-developed and well-nourished.  Cardiovascular: Regular rhythm and normal heart sounds.   Palpable radial pulses  Pulmonary/Chest: Effort normal and breath sounds normal. No respiratory distress. He has no wheezes. He has no rhonchi. He has no rales.  Musculoskeletal:       Hands: Swelling as noted on diagram. No warmth or erythema. Point tenderness. Skin intact. Grip strength, bilateral, symmetric.     Neurological: He is alert.  Skin: Skin is warm and dry.  Psychiatric: He has a normal mood and affect. His speech is normal and behavior is normal.  Vitals reviewed.      Assessment & Plan:  1. Thumb pain, left Abrupt onset and pain improving supports working diagnosis of gout. Pending XR and labs.   - CBC with Differential/Platelet - Comprehensive metabolic panel - C-reactive protein - Sedimentation rate - DG Hand Complete Left     I am having Mr. Ryan Cantrell maintain his acetaminophen, nitroGLYCERIN, albuterol, atorvastatin, triamcinolone cream, metoprolol succinate, and clopidogrel.   No orders of the defined types were placed in this encounter.   Return precautions given.   Risks, benefits, and alternatives of the medications and treatment plan prescribed today were discussed, and patient expressed understanding.   Education regarding symptom management and diagnosis given to patient on AVS.  Continue to follow with Tillman Abideichard Letvak, MD for routine health maintenance.   Ryan SquiresMark A Cantrell and I agreed with plan.   Rennie PlowmanMargaret Cornie Herrington, FNP

## 2016-09-30 ENCOUNTER — Encounter: Payer: Self-pay | Admitting: Cardiology

## 2016-10-09 ENCOUNTER — Ambulatory Visit (INDEPENDENT_AMBULATORY_CARE_PROVIDER_SITE_OTHER): Payer: Commercial Managed Care - HMO | Admitting: Internal Medicine

## 2016-10-09 ENCOUNTER — Encounter: Payer: Self-pay | Admitting: Internal Medicine

## 2016-10-09 VITALS — BP 118/80 | HR 61 | Temp 97.8°F | Ht 69.0 in | Wt 148.0 lb

## 2016-10-09 DIAGNOSIS — Z125 Encounter for screening for malignant neoplasm of prostate: Secondary | ICD-10-CM | POA: Diagnosis not present

## 2016-10-09 DIAGNOSIS — Z23 Encounter for immunization: Secondary | ICD-10-CM

## 2016-10-09 DIAGNOSIS — I2510 Atherosclerotic heart disease of native coronary artery without angina pectoris: Secondary | ICD-10-CM

## 2016-10-09 DIAGNOSIS — Z0000 Encounter for general adult medical examination without abnormal findings: Secondary | ICD-10-CM

## 2016-10-09 DIAGNOSIS — J439 Emphysema, unspecified: Secondary | ICD-10-CM | POA: Diagnosis not present

## 2016-10-09 DIAGNOSIS — Z9861 Coronary angioplasty status: Secondary | ICD-10-CM | POA: Diagnosis not present

## 2016-10-09 LAB — CBC WITH DIFFERENTIAL/PLATELET
BASOS ABS: 0.1 10*3/uL (ref 0.0–0.1)
Basophils Relative: 1.4 % (ref 0.0–3.0)
EOS PCT: 1.2 % (ref 0.0–5.0)
Eosinophils Absolute: 0.1 10*3/uL (ref 0.0–0.7)
HEMATOCRIT: 40.7 % (ref 39.0–52.0)
Hemoglobin: 14 g/dL (ref 13.0–17.0)
LYMPHS ABS: 1 10*3/uL (ref 0.7–4.0)
LYMPHS PCT: 20.5 % (ref 12.0–46.0)
MCHC: 34.4 g/dL (ref 30.0–36.0)
MCV: 85.5 fl (ref 78.0–100.0)
MONOS PCT: 14.7 % — AB (ref 3.0–12.0)
Monocytes Absolute: 0.7 10*3/uL (ref 0.1–1.0)
NEUTROS PCT: 62.2 % (ref 43.0–77.0)
Neutro Abs: 2.9 10*3/uL (ref 1.4–7.7)
Platelets: 307 10*3/uL (ref 150.0–400.0)
RBC: 4.76 Mil/uL (ref 4.22–5.81)
RDW: 13 % (ref 11.5–15.5)
WBC: 4.6 10*3/uL (ref 4.0–10.5)

## 2016-10-09 LAB — COMPREHENSIVE METABOLIC PANEL
ALK PHOS: 55 U/L (ref 39–117)
ALT: 13 U/L (ref 0–53)
AST: 18 U/L (ref 0–37)
Albumin: 4.6 g/dL (ref 3.5–5.2)
BILIRUBIN TOTAL: 1.2 mg/dL (ref 0.2–1.2)
BUN: 6 mg/dL (ref 6–23)
CALCIUM: 9.4 mg/dL (ref 8.4–10.5)
CO2: 31 mEq/L (ref 19–32)
Chloride: 98 mEq/L (ref 96–112)
Creatinine, Ser: 0.82 mg/dL (ref 0.40–1.50)
GFR: 103.72 mL/min (ref >60.00)
GLUCOSE: 104 mg/dL — AB (ref 70–99)
POTASSIUM: 4.1 meq/L (ref 3.5–5.1)
Sodium: 134 mEq/L — ABNORMAL LOW (ref 135–145)
Total Protein: 7.4 g/dL (ref 6.0–8.3)

## 2016-10-09 LAB — PSA: PSA: 3 ng/mL (ref 0.10–4.00)

## 2016-10-09 LAB — LIPID PANEL
CHOL/HDL RATIO: 3
Cholesterol: 94 mg/dL (ref 0–200)
HDL: 30.5 mg/dL — AB (ref >39.00)
LDL CALC: 44 mg/dL (ref 0–99)
NONHDL: 63.72
TRIGLYCERIDES: 101 mg/dL (ref 0.0–149.0)
VLDL: 20.2 mg/dL (ref 0.0–40.0)

## 2016-10-09 MED ORDER — ALBUTEROL SULFATE HFA 108 (90 BASE) MCG/ACT IN AERS: 2.0000 | INHALATION_SPRAY | Freq: Every day | RESPIRATORY_TRACT | 0 refills | Status: DC | PRN

## 2016-10-09 NOTE — Assessment & Plan Note (Signed)
No symptoms now Continues with yearly cardiology follow up

## 2016-10-09 NOTE — Progress Notes (Signed)
Pre visit review using our clinic review tool, if applicable. No additional management support is needed unless otherwise documented below in the visit note. 

## 2016-10-09 NOTE — Assessment & Plan Note (Signed)
Doing well Will check PSA Colon due 2023 Discussed increasing exercise

## 2016-10-09 NOTE — Addendum Note (Signed)
Addended by: Eual FinesBRIDGES, SHANNON P on: 10/09/2016 01:14 PM   Modules accepted: Orders

## 2016-10-09 NOTE — Assessment & Plan Note (Signed)
Symptoms better since changed to e-cigs Discussed trying to at least cut down on this

## 2016-10-09 NOTE — Progress Notes (Signed)
Subjective:    Patient ID: Ryan Cantrell, male    DOB: Dec 05, 1960, 56 y.o.   MRN: [id number]  HPI Here for physical  Had a bad headache and head congestion this weekend Improving but still not back to normal Not SOB Still smoking e-cigs (no real cigarettes in over 2 years). Tries to use as little as possible but not ready to stop completely  Off all meds for the COPD for over 1.5 years Uses the albuterol very rarely (a few times a year)  Heart has been okay No chest pain No palpitations No dizziness or syncope Not exercising  Current Outpatient Prescriptions on File Prior to Visit  Medication Sig Dispense Refill  . acetaminophen (TYLENOL) 500 MG tablet Take 1,000 mg by mouth 2 (two) times daily as needed for mild pain or moderate pain.     Marland Kitchen albuterol (PROAIR HFA) 108 (90 BASE) MCG/ACT inhaler Inhale 2 puffs into the lungs daily as needed for wheezing or shortness of breath. 1 Inhaler 1  . atorvastatin (LIPITOR) 20 MG tablet Take 1 tablet (20 mg total) by mouth daily. 90 tablet 2  . clopidogrel (PLAVIX) 75 MG tablet Take 1 tablet by mouth  daily 90 tablet 2  . metoprolol succinate (TOPROL-XL) 25 MG 24 hr tablet Take 1 tablet by mouth  daily 90 tablet 2  . nitroGLYCERIN (NITROSTAT) 0.4 MG SL tablet Place 1 tablet (0.4 mg total) under the tongue every 5 (five) minutes as needed for chest pain. 25 tablet 3  . triamcinolone cream (KENALOG) 0.1 % Apply 1 application topically 2 (two) times daily as needed. 45 g 1   No current facility-administered medications on file prior to visit.     Allergies  Allergen Reactions  . Codeine Sulfate Nausea And Vomiting    Past Medical History:  Diagnosis Date  . CAD S/P percutaneous coronary angioplasty: Inf STEMI - 100% dRCA - PCI Promus P 2.5 mm x 12mm (2.75mm) 05/29/2014   a. MI 05/29/14 dRCA PCI - Promus Premier DES 2.5 mm x 12 mm (2.8 mm); b. Echo: EF 55-60%, mild basal-inferior HK, normal DD  . COPD (chronic obstructive pulmonary disease)  (HCC)     PFTs 2013.. Mod severe COPD...   . Emphysema   . ST elevation myocardial infarction (STEMI) of inferior wall, initial episode of care (HCC) 05/29/2014   Echocardiogram 05/30/2014: EF 55-60%. Mild HK of the basal inferior wall. Normal valves    Past Surgical History:  Procedure Laterality Date  . CARDIAC CATHETERIZATION  05/29/2014   a. MI 05/29/14 dRCA 100%; D1 ~45%, Normal EF __> PCI to RCA  . Fracture left humerus  2009   no surgery. Torn rotator cuff also  . INGUINAL HERNIA REPAIR     twice on right and once on left-- 1980's-90's  . PERCUTANEOUS CORONARY STENT INTERVENTION (PCI-S)     dRCA - Promus Premie DES 2.5 mm x 12 mm (2.8 mm)  . Right shoulder surgery  1987    Family History  Problem Relation Age of Onset  . Hypertension Mother   . Diabetes Mother   . Heart disease Mother     stent  . Cancer Father     prostate  . Hypertension Father   . Gout Father   . Heart disease Father     Stent  . Colon cancer Neg Hx   . Stomach cancer Neg Hx     Social History   Social History  . Marital status: Married  Spouse name: N/A  . Number of children: 1  . 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 Smoking status: Current Every Day Smoker    Packs/day: 1.00    Types: E-cigarettes    Last attempt to quit: 05/29/2014  . Smokeless tobacco: Never Used     Comment: occ e-cigarette--no cigarettes since MI  . Alcohol use No  . Drug use: No  . Sexual activity: Yes   Other Topics Concern  . Not on file   Social History Narrative   Married - 1 child.   Smokes 1/2 ppd.   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   Review of Systems  Constitutional: Negative for fatigue and unexpected weight change.       Wears seat belt  HENT: Positive for hearing loss. Negative for tinnitus.        Needs to address problems with teeth--needs widespread extractions  Eyes: Negative for visual  disturbance.       No diplopia or unilateral vision loss  Respiratory: Negative for chest tightness and shortness of breath.        Only coughs with cold and PND  Cardiovascular: Negative for chest pain, palpitations and leg swelling.  Gastrointestinal: Negative for abdominal pain, blood in stool, nausea and vomiting.       No heartburn  Endocrine: Negative for polydipsia and polyuria.  Genitourinary: Negative for difficulty urinating and urgency.       Mild ED--not ready for meds  Musculoskeletal: Negative for arthralgias and joint swelling.       AM back pain despite new mattress  Skin:       No suspicious lesions Occasional eczema flare  Allergic/Immunologic: Negative for environmental allergies and immunocompromised state.  Neurological: Negative for dizziness, syncope, light-headedness and headaches.  Hematological: Negative for adenopathy. Bruises/bleeds easily.  Psychiatric/Behavioral: Negative for dysphoric mood and sleep disturbance. The patient is not nervous/anxious.        Objective:   Physical Exam  Constitutional: He is oriented to person, place, and time. He appears well-developed and well-nourished. No distress.  HENT:  Head: Normocephalic and atraumatic.  Right Ear: External ear normal.  Left Ear: External ear normal.  Mouth/Throat: Oropharynx is clear and moist. No oropharyngeal exudate.  Eyes: Conjunctivae are normal. Pupils are equal, round, and reactive to light.  Neck: Normal range of motion. Neck supple. No thyromegaly present.  Cardiovascular: Normal rate, regular rhythm, normal heart sounds and intact distal pulses.  Exam reveals no gallop.   No murmur heard. Pulmonary/Chest: Effort normal. No respiratory distress. He has no wheezes. He has no rales.  Decreased breath sounds but clear  Abdominal: Soft.  Musculoskeletal: He exhibits no edema or tenderness.  Lymphadenopathy:    He has no cervical adenopathy.  Neurological: He is alert and oriented to  person, place, and time.  Skin: No rash noted. No erythema.  Psychiatric: He has a normal mood and affect. His behavior is normal.          Assessment & Plan:

## 2016-10-13 ENCOUNTER — Other Ambulatory Visit: Payer: Self-pay | Admitting: Cardiology

## 2017-02-13 ENCOUNTER — Ambulatory Visit (INDEPENDENT_AMBULATORY_CARE_PROVIDER_SITE_OTHER): Payer: Commercial Managed Care - HMO | Admitting: Cardiology

## 2017-02-13 ENCOUNTER — Encounter: Payer: Self-pay | Admitting: Cardiology

## 2017-02-13 VITALS — BP 118/78 | HR 52 | Ht 70.0 in | Wt 145.0 lb

## 2017-02-13 DIAGNOSIS — Z9861 Coronary angioplasty status: Secondary | ICD-10-CM

## 2017-02-13 DIAGNOSIS — I2119 ST elevation (STEMI) myocardial infarction involving other coronary artery of inferior wall: Secondary | ICD-10-CM | POA: Diagnosis not present

## 2017-02-13 DIAGNOSIS — N5201 Erectile dysfunction due to arterial insufficiency: Secondary | ICD-10-CM

## 2017-02-13 DIAGNOSIS — Z87891 Personal history of nicotine dependence: Secondary | ICD-10-CM | POA: Diagnosis not present

## 2017-02-13 DIAGNOSIS — I2510 Atherosclerotic heart disease of native coronary artery without angina pectoris: Secondary | ICD-10-CM | POA: Diagnosis not present

## 2017-02-13 MED ORDER — NITROGLYCERIN 0.4 MG SL SUBL: 0.4000 mg | SUBLINGUAL_TABLET | SUBLINGUAL | 3 refills | Status: DC | PRN

## 2017-02-13 NOTE — Progress Notes (Signed)
PCP: Karie Schwalbe, MD  Clinic Note: Chief Complaint  Patient presents with  . Follow-up    NO chest pain, shortness of breath, edema, pain or cramping in legs, lightheaded or dizziness  . Coronary Artery Disease    s/p PCI for STEMI  . Erectile Dysfunction    HPI: Ryan Cantrell is a 56 y.o. male with a PMH below who presents today for annual f/u for CAD-PCI.  Ryan Cantrell was last seen on 02/28/2016 - this was for follow-up of an emergency room visit for left-sided chest discomfort that was probably more related to musculoskeletal changes.  Recent Hospitalizations: None  Studies Personally Reviewed - (if available, images/films reviewed: From Epic Chart or Care Everywhere)  None  Interval History: Ryan Cantrell presents today really get knocked placement of any cardiac issues. Hazel twinges in his chest off and on, but nothing like his MI pain. He may get out of breath if the exerts himself significantly. He had one episode about a month ago where he felt hot and flushed outside after working in the yard all day. He felt lightheaded and dizzy. Once he went inside and rehydrated and cool down, he notably improved. Did not have true syncope. He did notice heart going fast and that improved with hydration. He has not had any recurrent symptoms to suggest recurrence of his MI related angina. He walks routinely without any issues. No PND, orthopnea or edema.  No palpitations, lightheadedness, dizziness, weakness or syncope/near syncope - with the exception of the one episode noted above No TIA/amaurosis fugax symptoms. - He does note having erectile dysfunction - he has desires, but is unable to maintain an erection. No claudication.  ROS: A comprehensive was performed. Pertinent symptoms noted in history of present illness Review of Systems  Constitutional: Negative for malaise/fatigue.  HENT: Negative for congestion and nosebleeds.   Respiratory: Positive for cough (Off and on. But  not much since he quit smoking.) and wheezing. Negative for shortness of breath.   Gastrointestinal: Negative for blood in stool and melena.  Genitourinary: Negative for hematuria.  Musculoskeletal: Negative for joint pain (Chest normal except pains).  Neurological:       1 episode of dizziness noted in history of present illness  Endo/Heme/Allergies: Negative for environmental allergies.  Psychiatric/Behavioral: Negative.   All other systems reviewed and are negative.  I have reviewed and (if needed) personally updated the patient's problem list, medications, allergies, past medical and surgical history, social and family history.   Past Medical History:  Diagnosis Date  . CAD S/P percutaneous coronary angioplasty: Inf STEMI - 100% dRCA - PCI Promus P 2.5 mm x 12mm (2.80mm) 05/29/2014   a. MI 05/29/14 dRCA PCI - Promus Premier DES 2.5 mm x 12 mm (2.8 mm); b. Echo: EF 55-60%, mild basal-inferior HK, normal DD  . COPD (chronic obstructive pulmonary disease) (HCC)     PFTs 2013.. Mod severe COPD...   . Emphysema   . ST elevation myocardial infarction (STEMI) of inferior wall, initial episode of care (HCC) 05/29/2014   Echocardiogram 05/30/2014: EF 55-60%. Mild HK of the basal inferior wall. Normal valves    Past Surgical History:  Procedure Laterality Date  . CARDIAC CATHETERIZATION  05/29/2014   a. MI 05/29/14 dRCA 100%; D1 ~45%, Normal EF __> PCI to RCA  . Fracture left humerus  2009   no surgery. Torn rotator cuff also  . INGUINAL HERNIA REPAIR     twice on right and once on  left-- 1980's-90's  . PERCUTANEOUS CORONARY STENT INTERVENTION (PCI-S)     dRCA - Promus Premie DES 2.5 mm x 12 mm (2.8 mm)  . Right shoulder surgery  1987    Current Meds  Medication Sig  . acetaminophen (TYLENOL) 500 MG tablet Take 1,000 mg by mouth 2 (two) times daily as needed for mild pain or moderate pain.   Marland Kitchen albuterol (PROAIR HFA) 108 (90 Base) MCG/ACT inhaler Inhale 2 puffs into the lungs daily as  needed for wheezing or shortness of breath.  Marland Kitchen atorvastatin (LIPITOR) 20 MG tablet TAKE 1 TABLET BY MOUTH  DAILY  . clopidogrel (PLAVIX) 75 MG tablet Take 1 tablet by mouth  daily  . metoprolol succinate (TOPROL-XL) 25 MG 24 hr tablet Take 1 tablet by mouth  daily  . nitroGLYCERIN (NITROSTAT) 0.4 MG SL tablet Place 1 tablet (0.4 mg total) under the tongue every 5 (five) minutes as needed for chest pain.  Marland Kitchen triamcinolone cream (KENALOG) 0.1 % Apply 1 application topically 2 (two) times daily as needed.  . [DISCONTINUED] nitroGLYCERIN (NITROSTAT) 0.4 MG SL tablet Place 1 tablet (0.4 mg total) under the tongue every 5 (five) minutes as needed for chest pain.  . [DISCONTINUED] nitroGLYCERIN (NITROSTAT) 0.4 MG SL tablet Place 1 tablet (0.4 mg total) under the tongue every 5 (five) minutes as needed for chest pain.    Allergies  Allergen Reactions  . Codeine Sulfate Nausea And Vomiting    Social History   Social History  . Marital status: Married    Spouse name: N/A  . Number of children: 1  . Years of education: N/A   Occupational History  . Control and instrumentation engineer    Social History Main Topics  . Smoking status: Former Smoker    Packs/day: 1.00    Types: E-cigarettes    Quit date: 05/29/2014  . Smokeless tobacco: Never Used     Comment: occ e-cigarette--no cigarettes since MI  . Alcohol use No  . Drug use: No  . Sexual activity: Yes   Other Topics Concern  . None   Social History Narrative   Married - 1 child.   Smokes 1/2 ppd.   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    family history includes Cancer in his father; Diabetes in his mother; Gout in his father; Heart disease in his father and mother; Hypertension in his father and mother.  Wt Readings from Last 3 Encounters:  02/13/17 145 lb (65.8 kg)  10/09/16 148 lb (67.1 kg)  08/19/16 153 lb 2 oz (69.5 kg)    PHYSICAL EXAM BP 118/78   Pulse (!) 52   Ht 5\' 10"  (1.778 m)   Wt 145 lb (65.8 kg)    BMI 20.81 kg/m  General appearance: alert, cooperative, appears stated age, no distress. Healthy-appearing. Well-nourished well-groomed. HEENT: [place]/AT, EOMI, MMM, anicteric sclera Neck: no adenopathy, no carotid bruit and no JVD Lungs: clear to auscultation bilaterally, normal percussion bilaterally and non-labored Heart: regular rate and rhythm, S1 &S2 normal, no murmur, click, rub or gallop; nondisplaced PMI Abdomen: soft, non-tender; bowel sounds normal; no masses,  no organomegaly; no HJR Extremities: extremities normal, atraumatic, no cyanosis, or edema  Pulses: 2+ and symmetric;  Skin: mobility and turgor normal, no evidence of bleeding or bruising and no lesions noted  Neurologic: Mental status: Alert & oriented x 3, thought content appropriate; non-focal exam.  Pleasant mood & affect    Adult ECG Report  Rate: 52 ;  Rhythm: normal sinus rhythm and Rightward axis. Otherwise normal intervals and durations. No ischemic ST and T-wave changes;   Narrative Interpretation: Stable, normal EKG   Other studies Reviewed: Additional studies/ records that were reviewed today include:  Recent Labs:    Lab Results  Component Value Date   CHOL 94 10/09/2016   HDL 30.50 (L) 10/09/2016   LDLCALC 44 10/09/2016   TRIG 101.0 10/09/2016   CHOLHDL 3 10/09/2016    ASSESSMENT / PLAN: Problem List Items Addressed This Visit    CAD S/P percutaneous coronary angioplasty: Inf STEMI - 100% dRCA - PCI Promus P 2.5 mm x 12mm (2.348mm) - Primary (Chronic)    No recurrent anginal symptoms. He remains on stable low-dose Toprol along with Plavix and statin.  - I did give him permission to hold metoprolol on the day if he is planning to have intercourse. Hopefully this will help with some of his erectile dysfunction      Relevant Medications   nitroGLYCERIN (NITROSTAT) 0.4 MG SL tablet   Other Relevant Orders   EKG 12-Lead   Erectile dysfunction (Chronic)    He probably has erectile dysfunction due to  microvascular disease as opposed to medications, however certainly being on a beta blocker may exacerbate this. Initial thought would be to hold Toprol on day that he would be hoping for intercourse. I did give him permission however to use Viagra. His wife is concerned, but I explained to him that that he is very safe using Viagra. I prefer Viagra to the other 2 options because of its more rapid pharmacology.      Former cigarette smoker (Chronic)    Congratulated his efforts. He is trying to wean down baby. His wife is getting on his case for using more nicotine than he should. He has been cutting down the      ST elevation myocardial infarction (STEMI) of inferior wall, subsequent episode of care (HCC) (Chronic)    Remained stable. Doing very well. No signs or symptoms of recurrence MI related angina. No heart failure symptoms. He is active in exercising on stable regimen.      Relevant Medications   nitroGLYCERIN (NITROSTAT) 0.4 MG SL tablet   Other Relevant Orders   EKG 12-Lead      Current medicines are reviewed at length with the patient today. (+/- concerns) n/a The following changes have been made: n/a  Patient Instructions   No change with current treatment or medicaitons  You may hold ( not take )Metoprolol the day of " special event" in the evening.    Your physician wants you to follow-up in 12 months with Dr Herbie BaltimoreHARDING. You will receive a reminder letter in the mail two months in advance. If you don't receive a letter, please call our office to schedule the follow-up appointment.     Studies Ordered:   Orders Placed This Encounter  Procedures  . EKG 12-Lead      Bryan Lemmaavid Harding, M.D., M.S. Interventional Cardiologist   Pager # [phone] Phone # [phone] [address] [place], [place] [address]

## 2017-02-13 NOTE — Patient Instructions (Signed)
No change with current treatment or medicaitons  You may hold ( not take )Metoprolol the day of " special event" in the evening.    Your physician wants you to follow-up in 12 months with Dr Herbie BaltimoreHARDING. You will receive a reminder letter in the mail two months in advance. If you don't receive a letter, please call our office to schedule the follow-up appointment.

## 2017-02-15 ENCOUNTER — Encounter: Payer: Self-pay | Admitting: Cardiology

## 2017-02-15 DIAGNOSIS — N529 Male erectile dysfunction, unspecified: Secondary | ICD-10-CM | POA: Insufficient documentation

## 2017-02-15 NOTE — Assessment & Plan Note (Signed)
Remained stable. Doing very well. No signs or symptoms of recurrence MI related angina. No heart failure symptoms. He is active in exercising on stable regimen.

## 2017-02-15 NOTE — Assessment & Plan Note (Signed)
No recurrent anginal symptoms. He remains on stable low-dose Toprol along with Plavix and statin.  - I did give him permission to hold metoprolol on the day if he is planning to have intercourse. Hopefully this will help with some of his erectile dysfunction

## 2017-02-15 NOTE — Assessment & Plan Note (Signed)
Congratulated his efforts. He is trying to wean down baby. His wife is getting on his case for using more nicotine than he should. He has been cutting down the

## 2017-02-15 NOTE — Assessment & Plan Note (Signed)
He probably has erectile dysfunction due to microvascular disease as opposed to medications, however certainly being on a beta blocker may exacerbate this. Initial thought would be to hold Toprol on day that he would be hoping for intercourse. I did give him permission however to use Viagra. His wife is concerned, but I explained to him that that he is very safe using Viagra. I prefer Viagra to the other 2 options because of its more rapid pharmacology.

## 2017-02-28 ENCOUNTER — Encounter: Payer: Self-pay | Admitting: Internal Medicine

## 2017-03-03 MED ORDER — TRIAMCINOLONE ACETONIDE 0.1 % EX CREA: 1.0000 "application " | TOPICAL_CREAM | Freq: Two times a day (BID) | CUTANEOUS | 1 refills | Status: DC | PRN

## 2017-04-09 ENCOUNTER — Other Ambulatory Visit: Payer: Self-pay | Admitting: Cardiology

## 2017-04-16 ENCOUNTER — Other Ambulatory Visit: Payer: Self-pay | Admitting: Cardiology

## 2017-04-16 ENCOUNTER — Encounter: Payer: Self-pay | Admitting: Cardiology

## 2017-04-17 MED ORDER — METOPROLOL SUCCINATE ER 25 MG PO TB24: 25.0000 mg | ORAL_TABLET | Freq: Every day | ORAL | 3 refills | Status: DC

## 2017-10-10 ENCOUNTER — Encounter: Payer: Commercial Managed Care - HMO | Admitting: Internal Medicine

## 2017-11-29 ENCOUNTER — Other Ambulatory Visit: Payer: Self-pay | Admitting: Cardiology

## 2018-01-14 ENCOUNTER — Encounter: Payer: Self-pay | Admitting: Internal Medicine

## 2018-01-14 ENCOUNTER — Encounter: Payer: Commercial Managed Care - HMO | Admitting: Internal Medicine

## 2018-01-14 ENCOUNTER — Ambulatory Visit (INDEPENDENT_AMBULATORY_CARE_PROVIDER_SITE_OTHER): Payer: 59 | Admitting: Internal Medicine

## 2018-01-14 VITALS — BP 136/86 | HR 57 | Temp 97.6°F | Ht 69.5 in | Wt 138.0 lb

## 2018-01-14 DIAGNOSIS — Z23 Encounter for immunization: Secondary | ICD-10-CM

## 2018-01-14 DIAGNOSIS — Z9861 Coronary angioplasty status: Secondary | ICD-10-CM

## 2018-01-14 DIAGNOSIS — I2510 Atherosclerotic heart disease of native coronary artery without angina pectoris: Secondary | ICD-10-CM

## 2018-01-14 DIAGNOSIS — Z0000 Encounter for general adult medical examination without abnormal findings: Secondary | ICD-10-CM | POA: Diagnosis not present

## 2018-01-14 DIAGNOSIS — J439 Emphysema, unspecified: Secondary | ICD-10-CM | POA: Diagnosis not present

## 2018-01-14 LAB — CBC
HEMATOCRIT: 43.3 % (ref 39.0–52.0)
Hemoglobin: 15 g/dL (ref 13.0–17.0)
MCHC: 34.5 g/dL (ref 30.0–36.0)
MCV: 85.7 fl (ref 78.0–100.0)
PLATELETS: 358 10*3/uL (ref 150.0–400.0)
RBC: 5.05 Mil/uL (ref 4.22–5.81)
RDW: 13 % (ref 11.5–15.5)
WBC: 6.2 10*3/uL (ref 4.0–10.5)

## 2018-01-14 LAB — COMPREHENSIVE METABOLIC PANEL
ALT: 16 U/L (ref 0–53)
AST: 20 U/L (ref 0–37)
Albumin: 4.7 g/dL (ref 3.5–5.2)
Alkaline Phosphatase: 58 U/L (ref 39–117)
BILIRUBIN TOTAL: 2.3 mg/dL — AB (ref 0.2–1.2)
BUN: 6 mg/dL (ref 6–23)
CALCIUM: 9.9 mg/dL (ref 8.4–10.5)
CHLORIDE: 97 meq/L (ref 96–112)
CO2: 30 meq/L (ref 19–32)
Creatinine, Ser: 0.85 mg/dL (ref 0.40–1.50)
GFR: 99.05 mL/min (ref >60.00)
Glucose, Bld: 110 mg/dL — ABNORMAL HIGH (ref 70–99)
Potassium: 4.4 mEq/L (ref 3.5–5.1)
Sodium: 133 mEq/L — ABNORMAL LOW (ref 135–145)
Total Protein: 8 g/dL (ref 6.0–8.3)

## 2018-01-14 LAB — LIPID PANEL
CHOL/HDL RATIO: 3
Cholesterol: 96 mg/dL (ref 0–200)
HDL: 34.3 mg/dL — AB (ref >39.00)
LDL Cholesterol: 42 mg/dL (ref 0–99)
NonHDL: 61.91
TRIGLYCERIDES: 101 mg/dL (ref 0.0–149.0)
VLDL: 20.2 mg/dL (ref 0.0–40.0)

## 2018-01-14 NOTE — Progress Notes (Signed)
Subjective:    Patient ID: Ryan Cantrell, male    DOB: 10-14-1960, 57 y.o.   MRN: [id number]  HPI Here with wife---for the physicals  No new concerns No heart problems---keeps up with cardiologist (Dr Herbie Baltimore)  Breathing is okay No wheezing or cough Still does e-cigs--discussed  Current Outpatient Medications on File Prior to Visit  Medication Sig Dispense Refill  . acetaminophen (TYLENOL) 500 MG tablet Take 1,000 mg by mouth 2 (two) times daily as needed for mild pain or moderate pain.     Marland Kitchen albuterol (PROAIR HFA) 108 (90 Base) MCG/ACT inhaler Inhale 2 puffs into the lungs daily as needed for wheezing or shortness of breath. 1 Inhaler 0  . atorvastatin (LIPITOR) 20 MG tablet TAKE 1 TABLET BY MOUTH  DAILY 90 tablet 1  . clopidogrel (PLAVIX) 75 MG tablet TAKE 1 TABLET BY MOUTH  DAILY 90 tablet 3  . metoprolol succinate (TOPROL-XL) 25 MG 24 hr tablet Take 1 tablet (25 mg total) by mouth daily. 90 tablet 3  . nitroGLYCERIN (NITROSTAT) 0.4 MG SL tablet Place 1 tablet (0.4 mg total) under the tongue every 5 (five) minutes as needed for chest pain. 25 tablet 3  . triamcinolone cream (KENALOG) 0.1 % Apply 1 application topically 2 (two) times daily as needed. 45 g 1   No current facility-administered medications on file prior to visit.     Allergies  Allergen Reactions  . Codeine Sulfate Nausea And Vomiting    Past Medical History:  Diagnosis Date  . CAD S/P percutaneous coronary angioplasty: Inf STEMI - 100% dRCA - PCI Promus P 2.5 mm x 12mm (2.19mm) 05/29/2014   a. MI 05/29/14 dRCA PCI - Promus Premier DES 2.5 mm x 12 mm (2.8 mm); b. Echo: EF 55-60%, mild basal-inferior HK, normal DD  . COPD (chronic obstructive pulmonary disease) (HCC)     PFTs 2013.. Mod severe COPD...   . Emphysema   . ST elevation myocardial infarction (STEMI) of inferior wall, initial episode of care (HCC) 05/29/2014   Echocardiogram 05/30/2014: EF 55-60%. Mild HK of the basal inferior wall. Normal valves     Past Surgical History:  Procedure Laterality Date  . CARDIAC CATHETERIZATION  05/29/2014   a. MI 05/29/14 dRCA 100%; D1 ~45%, Normal EF __> PCI to RCA  . Fracture left humerus  2009   no surgery. Torn rotator cuff also  . INGUINAL HERNIA REPAIR     twice on right and once on left-- 1980's-90's  . PERCUTANEOUS CORONARY STENT INTERVENTION (PCI-S)     dRCA - Promus Premie DES 2.5 mm x 12 mm (2.8 mm)  . Right shoulder surgery  1987    Family History  Problem Relation Age of Onset  . Hypertension Mother   . Diabetes Mother   . Heart disease Mother        stent  . Cancer Father        prostate  . Hypertension Father   . Gout Father   . Heart disease Father        Stent  . Colon cancer Neg Hx   . Stomach cancer Neg Hx     Social History   Socioeconomic History  . Marital status: Married    Spouse name: Not on file  . Number of children: 1  . Years of education: Not on file  . Highest education level: Not on file  Occupational History  . Occupation: Control and instrumentation engineer  Social Needs  . Financial resource strain:  Not on file  . Food insecurity:    Worry: Not on file    Inability: Not on file  . Transportation needs:    Medical: Not on file    Non-medical: Not on file  Tobacco Use  . Smoking status: Former Smoker    Packs/day: 1.00    Types: E-cigarettes    Last attempt to quit: 05/29/2014    Years since quitting: 3.6  . Smokeless tobacco: Never Used  . Tobacco comment: occ e-cigarette--no cigarettes since MI  Substance and Sexual Activity  . Alcohol use: No    Alcohol/week: 0.0 oz  . Drug use: No  . Sexual activity: Yes  Lifestyle  . Physical activity:    Days per week: Not on file    Minutes per session: Not on file  . Stress: Not on file  Relationships  . Social connections:    Talks on phone: Not on file    Gets together: Not on file    Attends religious service: Not on file    Active member of club or organization: Not on file    Attends meetings of  clubs or organizations: Not on file    Relationship status: Not on file  . Intimate partner violence:    Fear of current or ex partner: Not on file    Emotionally abused: Not on file    Physically abused: Not on file    Forced sexual activity: Not on file  Other Topics Concern  . Not on file  Social History Narrative   Married - 1 child.   Smokes 1/2 ppd.   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   Review of Systems  Constitutional: Negative for fatigue.       Weight is down some Wears seat belt No exercise--discussed   HENT: Positive for hearing loss. Negative for tinnitus.        Discussed using hearing protection regularly at work Teeth are bad--needs action  Eyes: Negative for visual disturbance.       No diplopia or unilateral vision loss  Respiratory: Negative for cough, chest tightness, shortness of breath and wheezing.   Cardiovascular: Negative for chest pain, palpitations and leg swelling.  Gastrointestinal: Negative for abdominal pain, blood in stool and constipation.       No heartburn  Endocrine: Negative for polydipsia and polyuria.  Genitourinary: Negative for difficulty urinating and urgency.       Some ED--wife not interested in him using viagra (has never used the nitro)  Musculoskeletal: Negative for back pain and joint swelling.       Mild pain in hands--especially left CMC  Skin:       Some eczema --especially on feet. Cream helps  Allergic/Immunologic: Positive for environmental allergies. Negative for immunocompromised state.       Mild symptoms--no meds  Neurological: Negative for dizziness, syncope and light-headedness.       Occasional headache-- ?from caffeine withdrawl  Hematological: Negative for adenopathy. Does not bruise/bleed easily.  Psychiatric/Behavioral: Negative for dysphoric mood and sleep disturbance. The patient is not nervous/anxious.        Still has some temper--not bad       Objective:   Physical  Exam  Constitutional: He is oriented to person, place, and time. He appears well-developed. No distress.  HENT:  Head: Normocephalic and atraumatic.  Right Ear: External ear normal.  Left Ear: External ear normal.  Mouth/Throat: Oropharynx is clear and moist. No oropharyngeal  exudate.  Eyes: Pupils are equal, round, and reactive to light. Conjunctivae are normal.  Neck: No thyromegaly present.  Cardiovascular: Normal rate, regular rhythm, normal heart sounds and intact distal pulses. Exam reveals no gallop.  No murmur heard. Abdominal: Soft. There is no tenderness.  Musculoskeletal: He exhibits no edema or tenderness.  Lymphadenopathy:    He has no cervical adenopathy.  Neurological: He is alert and oriented to person, place, and time.  Skin: Skin is warm. No rash noted.  Psychiatric: He has a normal mood and affect. His behavior is normal.          Assessment & Plan:

## 2018-01-14 NOTE — Addendum Note (Signed)
Addended by: Eual Fines on: 01/14/2018 11:44 AM   Modules accepted: Orders

## 2018-01-14 NOTE — Assessment & Plan Note (Signed)
No symptoms Keeps up with Dr Herbie Baltimore

## 2018-01-14 NOTE — Assessment & Plan Note (Signed)
Healthy Needs to work on fitness again Td booster Yearly flu vaccine Consider shingrix Colon due 2023 Defer PSA to next year at least

## 2018-01-14 NOTE — Assessment & Plan Note (Signed)
No symptoms since stopping smoking 

## 2018-03-02 ENCOUNTER — Other Ambulatory Visit: Payer: Self-pay

## 2018-03-02 ENCOUNTER — Encounter: Payer: Self-pay | Admitting: Emergency Medicine

## 2018-03-02 ENCOUNTER — Emergency Department: Payer: 59

## 2018-03-02 ENCOUNTER — Observation Stay
Admission: EM | Admit: 2018-03-02 | Discharge: 2018-03-03 | Disposition: A | Discharge status: Home / Self Care | Payer: 59 | Attending: Internal Medicine | Admitting: Internal Medicine

## 2018-03-02 ENCOUNTER — Telehealth: Payer: Self-pay | Admitting: Cardiology

## 2018-03-02 ENCOUNTER — Other Ambulatory Visit: Payer: Self-pay | Admitting: Cardiology

## 2018-03-02 DIAGNOSIS — E785 Hyperlipidemia, unspecified: Secondary | ICD-10-CM | POA: Insufficient documentation

## 2018-03-02 DIAGNOSIS — Z7902 Long term (current) use of antithrombotics/antiplatelets: Secondary | ICD-10-CM | POA: Insufficient documentation

## 2018-03-02 DIAGNOSIS — Z955 Presence of coronary angioplasty implant and graft: Secondary | ICD-10-CM | POA: Insufficient documentation

## 2018-03-02 DIAGNOSIS — J449 Chronic obstructive pulmonary disease, unspecified: Secondary | ICD-10-CM | POA: Diagnosis not present

## 2018-03-02 DIAGNOSIS — Z7982 Long term (current) use of aspirin: Secondary | ICD-10-CM | POA: Insufficient documentation

## 2018-03-02 DIAGNOSIS — I25110 Atherosclerotic heart disease of native coronary artery with unstable angina pectoris: Principal | ICD-10-CM | POA: Insufficient documentation

## 2018-03-02 DIAGNOSIS — I200 Unstable angina: Secondary | ICD-10-CM | POA: Diagnosis not present

## 2018-03-02 DIAGNOSIS — R079 Chest pain, unspecified: Secondary | ICD-10-CM | POA: Diagnosis present

## 2018-03-02 DIAGNOSIS — Z79899 Other long term (current) drug therapy: Secondary | ICD-10-CM | POA: Insufficient documentation

## 2018-03-02 DIAGNOSIS — I2510 Atherosclerotic heart disease of native coronary artery without angina pectoris: Secondary | ICD-10-CM | POA: Diagnosis not present

## 2018-03-02 DIAGNOSIS — Z87891 Personal history of nicotine dependence: Secondary | ICD-10-CM | POA: Diagnosis not present

## 2018-03-02 DIAGNOSIS — R0789 Other chest pain: Secondary | ICD-10-CM | POA: Diagnosis not present

## 2018-03-02 DIAGNOSIS — I252 Old myocardial infarction: Secondary | ICD-10-CM | POA: Insufficient documentation

## 2018-03-02 LAB — BASIC METABOLIC PANEL
ANION GAP: 7 (ref 5–15)
BUN: <5 mg/dL — ABNORMAL LOW (ref 6–20)
CHLORIDE: 96 mmol/L — AB (ref 98–111)
CO2: 29 mmol/L (ref 22–32)
CREATININE: 0.8 mg/dL (ref 0.61–1.24)
Calcium: 9.2 mg/dL (ref 8.9–10.3)
GFR calc Af Amer: >60 mL/min (ref >60)
GFR calc non Af Amer: >60 mL/min (ref >60)
Glucose, Bld: 127 mg/dL — ABNORMAL HIGH (ref 70–99)
Potassium: 3.8 mmol/L (ref 3.5–5.1)
SODIUM: 132 mmol/L — AB (ref 135–145)

## 2018-03-02 LAB — TROPONIN I

## 2018-03-02 LAB — CBC
HCT: 39.8 % — ABNORMAL LOW (ref 40.0–52.0)
HEMOGLOBIN: 13.8 g/dL (ref 13.0–18.0)
MCH: 29.7 pg (ref 26.0–34.0)
MCHC: 34.7 g/dL (ref 32.0–36.0)
MCV: 85.6 fL (ref 80.0–100.0)
PLATELETS: 351 10*3/uL (ref 150–440)
RBC: 4.65 MIL/uL (ref 4.40–5.90)
RDW: 13 % (ref 11.5–14.5)
WBC: 5.6 10*3/uL (ref 3.8–10.6)

## 2018-03-02 MED ORDER — ASPIRIN 81 MG PO CHEW
324.0000 mg | CHEWABLE_TABLET | Freq: Once | ORAL | Status: AC
  Administered 2018-03-02: 324 mg via ORAL
  Filled 2018-03-02: qty 4

## 2018-03-02 MED ORDER — SODIUM CHLORIDE 0.9% FLUSH
3.0000 mL | Freq: Two times a day (BID) | INTRAVENOUS | Status: DC
  Administered 2018-03-02: 3 mL via INTRAVENOUS

## 2018-03-02 MED ORDER — CLOPIDOGREL BISULFATE 75 MG PO TABS
75.0000 mg | ORAL_TABLET | Freq: Every evening | ORAL | Status: DC
  Administered 2018-03-02: 75 mg via ORAL
  Filled 2018-03-02: qty 1

## 2018-03-02 MED ORDER — ASPIRIN EC 81 MG PO TBEC: 81.0000 mg | DELAYED_RELEASE_TABLET | Freq: Every day | ORAL | Status: DC

## 2018-03-02 MED ORDER — SODIUM CHLORIDE 0.9 % IV SOLN: 250.0000 mL | INTRAVENOUS | Status: DC | PRN

## 2018-03-02 MED ORDER — SODIUM CHLORIDE 0.9% FLUSH: 3.0000 mL | INTRAVENOUS | Status: DC | PRN

## 2018-03-02 MED ORDER — ASPIRIN 300 MG RE SUPP: 300.0000 mg | RECTAL | Status: DC

## 2018-03-02 MED ORDER — METOPROLOL SUCCINATE ER 25 MG PO TB24
25.0000 mg | ORAL_TABLET | Freq: Every day | ORAL | Status: DC
  Administered 2018-03-02: 25 mg via ORAL
  Filled 2018-03-02: qty 1

## 2018-03-02 MED ORDER — ENOXAPARIN SODIUM 40 MG/0.4ML ~~LOC~~ SOLN: 40.0000 mg | SUBCUTANEOUS | Status: DC

## 2018-03-02 MED ORDER — ONDANSETRON HCL 4 MG/2ML IJ SOLN: 4.0000 mg | Freq: Four times a day (QID) | INTRAMUSCULAR | Status: DC | PRN

## 2018-03-02 MED ORDER — ATORVASTATIN CALCIUM 20 MG PO TABS
20.0000 mg | ORAL_TABLET | Freq: Every evening | ORAL | Status: DC
  Administered 2018-03-02: 20 mg via ORAL
  Filled 2018-03-02: qty 1

## 2018-03-02 MED ORDER — METOPROLOL SUCCINATE ER 25 MG PO TB24: 25.0000 mg | ORAL_TABLET | Freq: Every day | ORAL | Status: DC

## 2018-03-02 MED ORDER — ACETAMINOPHEN 325 MG PO TABS: 650.0000 mg | ORAL_TABLET | ORAL | Status: DC | PRN

## 2018-03-02 MED ORDER — ASPIRIN 81 MG PO CHEW: 324.0000 mg | CHEWABLE_TABLET | ORAL | Status: DC

## 2018-03-02 MED ORDER — ALBUTEROL SULFATE (2.5 MG/3ML) 0.083% IN NEBU: 3.0000 mL | INHALATION_SOLUTION | Freq: Every day | RESPIRATORY_TRACT | Status: DC | PRN

## 2018-03-02 MED ORDER — NITROGLYCERIN 0.4 MG SL SUBL: 0.4000 mg | SUBLINGUAL_TABLET | SUBLINGUAL | Status: DC | PRN

## 2018-03-02 NOTE — ED Notes (Signed)
[facility] and pt updated with plan of care.

## 2018-03-02 NOTE — ED Provider Notes (Signed)
[facility] Emergency Department Provider Note   ____________________________________________   First MD Initiated Contact with Patient 03/02/18 2012     (approximate)  I have reviewed the triage vital signs and the nursing notes.   HISTORY  Chief Complaint Chest Pain    HPI Ryan Cantrell is a 57 y.o. male again experiencing chest discomfort about 430 or 5:00 PM.  Reports feeling hard to describe discomfort like a hotness or burning feeling on the left side of the ribs and chest.  Not associate any nausea vomiting or shortness of breath  Reports the pain is subsided now without treatment.  Does have a strong history of heart disease including previous blockages and MI with stent placement.  Reports he continues take Plavix daily but is not longer on aspirin.  No fevers or chills.  No shortness of breath.  Ports pain is subsided, but was moderate burning-like in nature over the left rib cage.  Reports it felt somewhat similar though not quite the same as the symptoms that he had with his previous MI when it was just starting out  Follows with [facility] cardiology.  Past Medical History:  Diagnosis Date  . CAD S/P percutaneous coronary angioplasty: Inf STEMI - 100% dRCA - PCI Promus P 2.5 mm x 12mm (2.95mm) 05/29/2014   a. MI 05/29/14 dRCA PCI - Promus Premier DES 2.5 mm x 12 mm (2.8 mm); b. Echo: EF 55-60%, mild basal-inferior HK, normal DD  . COPD (chronic obstructive pulmonary disease) (HCC)     PFTs 2013.. Mod severe COPD...   . Emphysema   . ST elevation myocardial infarction (STEMI) of inferior wall, initial episode of care (HCC) 05/29/2014   Echocardiogram 05/30/2014: EF 55-60%. Mild HK of the basal inferior wall. Normal valves    Patient Active Problem List   Diagnosis Date Noted  . Chest pain 03/02/2018  . Erectile dysfunction 02/15/2017  . Eczema 07/21/2014  . Pulmonary emphysema (HCC)   . Borderline systolic hypertension 06/24/2014  . ST  elevation myocardial infarction (STEMI) of inferior wall, subsequent episode of care [facility]) 05/29/2014    Class: Hospitalized for  . CAD S/P percutaneous coronary angioplasty: Inf STEMI - 100% dRCA - PCI Promus P 2.5 mm x 12mm (2.42mm) 05/29/2014    Class: Hospitalized for  . Former cigarette smoker 09/24/2012  . Routine general medical examination at a health care facility 06/22/2012  . Moderate  severe COPD (chronic obstructive pulmonary disease) 06/22/2012    Past Surgical History:  Procedure Laterality Date  . CARDIAC CATHETERIZATION  05/29/2014   a. MI 05/29/14 dRCA 100%; D1 ~45%, Normal EF __> PCI to RCA  . Fracture left humerus  2009   no surgery. Torn rotator cuff also  . INGUINAL HERNIA REPAIR     twice on right and once on left-- 1980's-90's  . PERCUTANEOUS CORONARY STENT INTERVENTION (PCI-S)     dRCA - Promus Premie DES 2.5 mm x 12 mm (2.8 mm)  . Right shoulder surgery  1987    Prior to Admission medications   Medication Sig Start Date End Date Taking? Authorizing Provider  acetaminophen (TYLENOL) 500 MG tablet Take 1,000 mg by mouth 2 (two) times daily as needed for mild pain or moderate pain.    Yes [provider]  albuterol (PROAIR HFA) 108 (90 Base) MCG/ACT inhaler Inhale 2 puffs into the lungs daily as needed for wheezing or shortness of breath. 10/09/16  Yes Karie Schwalbe, MD  atorvastatin (LIPITOR)  20 MG tablet TAKE 1 TABLET BY MOUTH  DAILY Patient taking differently: TAKE 1 TABLET BY MOUTH  DAILY AT BEDTIME 12/01/17  Yes Marykay LexHarding, David W, MD  clopidogrel (PLAVIX) 75 MG tablet TAKE 1 TABLET BY MOUTH  DAILY Patient taking differently: TAKE 1 TABLET BY MOUTH  DAILY AT BEDTIME 04/10/17  Yes Marykay LexHarding, David W, MD  metoprolol succinate (TOPROL-XL) 25 MG 24 hr tablet Take 1 tablet (25 mg total) by mouth daily. Patient taking differently: Take 25 mg by mouth at bedtime.  04/17/17  Yes Marykay LexHarding, David W, MD  nitroGLYCERIN (NITROSTAT) 0.4 MG SL tablet Place 1 tablet (0.4 mg  total) under the tongue every 5 (five) minutes as needed for chest pain. 02/13/17  Yes Marykay LexHarding, David W, MD  triamcinolone cream (KENALOG) 0.1 % Apply 1 application topically 2 (two) times daily as needed. Patient taking differently: Apply 1 application topically 2 (two) times daily as needed (irritation).  03/03/17  Yes Karie SchwalbeLetvak, Richard I, MD    Allergies Codeine sulfate  Family History  Problem Relation Age of Onset  . Hypertension Mother   . Diabetes Mother   . Heart disease Mother        stent  . Cancer Father        prostate  . Hypertension Father   . Gout Father   . Heart disease Father        Stent  . Colon cancer Neg Hx   . Stomach cancer Neg Hx     Social History Social History   Tobacco Use  . Smoking status: Former Smoker    Packs/day: 1.00    Types: E-cigarettes    Last attempt to quit: 05/29/2014    Years since quitting: 3.7  . Smokeless tobacco: Never Used  . Tobacco comment: occ e-cigarette--no cigarettes since MI  Substance Use Topics  . Alcohol use: No    Alcohol/week: 0.0 oz  . Drug use: No    Review of Systems Constitutional: No fever/chills Eyes: No visual changes. ENT: No sore throat. Cardiovascular: See HPI.  No radiation.  No neck pain or back pain.  No ripping tearing or moving pain.  Respiratory: Denies shortness of breath. Gastrointestinal: No abdominal pain.  No nausea, no vomiting.  No diarrhea.  No constipation. Genitourinary: Negative for dysuria. Musculoskeletal: Negative for back pain. Skin: Negative for rash. Neurological: Negative for headaches, focal weakness or numbness.    ____________________________________________   PHYSICAL EXAM:  VITAL SIGNS: ED Triage Vitals [03/02/18 1902]  Enc Vitals Group     BP (!) 140/96     Pulse Rate 63     Resp 17     Temp 98 F (36.7 C)     Temp Source Oral     SpO2 99 %     Weight 138 lb (62.6 kg)     Height      Head Circumference      Peak Flow      Pain Score      Pain Loc       Pain Edu?      Excl. in GC?     Constitutional: Alert and oriented. Well appearing and in no acute distress. Eyes: Conjunctivae are normal. Head: Atraumatic. Nose: No congestion/rhinnorhea. Mouth/Throat: Mucous membranes are moist. Neck: No stridor.   Cardiovascular: Normal rate, regular rhythm. Grossly normal heart sounds.  Good peripheral circulation. Respiratory: Normal respiratory effort.  No retractions. Lungs CTAB. Gastrointestinal: Soft and nontender. No distention. Musculoskeletal: No lower extremity tenderness nor  edema. Neurologic:  Normal speech and language. No gross focal neurologic deficits are appreciated.  Skin:  Skin is warm, dry and intact. No rash noted. Psychiatric: Mood and affect are normal. Speech and behavior are normal.  ____________________________________________   LABS (all labs ordered are listed, but only abnormal results are displayed)  Labs Reviewed  BASIC METABOLIC PANEL - Abnormal; Notable for the following components:      Result Value   Sodium 132 (*)    Chloride 96 (*)    Glucose, Bld 127 (*)    BUN <5 (*)    All other components within normal limits  CBC - Abnormal; Notable for the following components:   HCT 39.8 (*)    All other components within normal limits  TROPONIN I  HIV ANTIBODY (ROUTINE TESTING)  CBC  CREATININE, SERUM  TROPONIN I  TROPONIN I  TROPONIN I  LIPID PANEL  BASIC METABOLIC PANEL   ____________________________________________  EKG  Reviewed and entered by me at 1900 Heart rate 60 QRS 120 QTc 410 Normal sinus rhythm, nonspecific but slightly biphasic appearance of T waves noted laterally.  Compared to his previous EKG in our system, this appears to be a persistent abnormality without acute change.  No ST elevation or obvious ischemic change. ____________________________________________  RADIOLOGY  Chest x-ray reviewed negative for  acute. ____________________________________________   PROCEDURES  Procedure(s) performed: None  Procedures  Critical Care performed: No  ____________________________________________   INITIAL IMPRESSION / ASSESSMENT AND PLAN / ED COURSE  Pertinent labs & imaging results that were available during my care of the patient were reviewed by me and considered in my medical decision making (see chart for details).  Differential diagnosis includes, but is not limited to, ACS, aortic dissection, pulmonary embolism, cardiac tamponade, pneumothorax, pneumonia, pericarditis, myocarditis, GI-related causes including esophagitis/gastritis, and musculoskeletal chest wall pain.    Patient moderate risk for coronary disease by heart score based on a strong previous history.  Currently no evidence of an acute ischemic event and his symptoms are improving.  Will give aspirin, discussed with patient plan to observe him.  No signs or symptoms of dissection.  No pleuritic pain, no hypoxia, no evidence to support concern for pulmonary embolism at this time.  ----------------------------------------- 9:37 PM on 03/02/2018 -----------------------------------------  Patient being admitted to hospitalist service.  Case discussed with Dr. Tobi Bastos.  Patient and wife agreeable to plan for admission.       ____________________________________________   FINAL CLINICAL IMPRESSION(S) / ED DIAGNOSES  Final diagnoses:  Moderate coronary artery risk chest pain      NEW MEDICATIONS STARTED DURING THIS VISIT:  Current Discharge Medication List       Note:  This document was prepared using Dragon voice recognition software and may include unintentional dictation errors.     Sharyn Creamer, MD 03/02/18 2138

## 2018-03-02 NOTE — Telephone Encounter (Signed)
     [facility] the [facility]'s wife, that the patient was having a "burning" sensation in his chest which began approximately 5:30 PM on 03/02/2018.  Called the patients wife back.  By that time, patient and [facility] were at [facility].  Patient's wife reports EKG was normal and blood work was pending.  Wife states that patient's burning sensation has subsided slightly since arrival [facility].  She reports he had no associated symptoms including diaphoresis, nausea, vomiting, fatigue, shortness of breath, dizziness or palpitations.  She reports that he has been compliant with his medications. Instructed that they will recieve excellent care and I will inform Dr. Herbie BaltimoreHarding of this episode.    Georgie ChardJill McDaniel NP-C [facility] Pager: [phone]

## 2018-03-02 NOTE — H&P (Signed)
Ryan Cantrell - Ryan Cantrell   PATIENT NAME: Ryan Cantrell    MR#:  [id number]  DATE OF BIRTH:  06/09/1961  DATE OF ADMISSION:  03/02/2018  PRIMARY CARE PHYSICIAN: Ryan SchwalbeLetvak, Ryan I, MD   REQUESTING/REFERRING PHYSICIAN:   CHIEF COMPLAINT:   Chief Complaint  Patient presents with  . Chest Pain    HISTORY OF PRESENT ILLNESS: Ryan GobbleMark Cantrell  is a 57 y.o. male with a known history of coronary artery disease, cardiac stent, STEMI and COPD presented to the emergency room with chest pain.  Patient was at work this morning when he noticed left-sided chest pain.  Pain is sharp in nature 7 out of 10 on a scale of 1-10.  Patient has been compliant with his medication.  No radiation of the pain.  Patient did not have any lightheadedness or any diaphoresis. First set of troponin is negative.  No complaints of any shortness of breath, orthopnea.  Hospitalist service was consulted for further care.  PAST MEDICAL HISTORY:   Past Medical History:  Diagnosis Date  . CAD S/P percutaneous coronary angioplasty: Inf STEMI - 100% dRCA - PCI Promus P 2.5 mm x 12mm (2.438mm) 05/29/2014   a. MI 05/29/14 dRCA PCI - Promus Premier DES 2.5 mm x 12 mm (2.8 mm); b. Echo: EF 55-60%, mild basal-inferior HK, normal DD  . COPD (chronic obstructive pulmonary disease) (HCC)     PFTs 2013.. Mod severe COPD...   . Emphysema   . ST elevation myocardial infarction (STEMI) of inferior wall, initial episode of care (HCC) 05/29/2014   Echocardiogram 05/30/2014: EF 55-60%. Mild HK of the basal inferior wall. Normal valves    PAST SURGICAL HISTORY:  Past Surgical History:  Procedure Laterality Date  . CARDIAC CATHETERIZATION  05/29/2014   a. MI 05/29/14 dRCA 100%; D1 ~45%, Normal EF __> PCI to RCA  . Fracture left humerus  2009   no surgery. Torn rotator cuff also  . INGUINAL HERNIA REPAIR     twice on right and once on left-- 1980's-90's  . PERCUTANEOUS CORONARY STENT INTERVENTION (PCI-S)     dRCA -  Promus Premie DES 2.5 mm x 12 mm (2.8 mm)  . Right shoulder surgery  1987    SOCIAL HISTORY:  Social History   Tobacco Use  . Smoking status: Former Smoker    Packs/day: 1.00    Types: E-cigarettes    Last attempt to quit: 05/29/2014    Years since quitting: 3.7  . Smokeless tobacco: Never Used  . Tobacco comment: occ e-cigarette--no cigarettes since MI  Substance Use Topics  . Alcohol use: No    Alcohol/week: 0.0 oz    FAMILY HISTORY:  Family History  Problem Relation Age of Onset  . Hypertension Mother   . Diabetes Mother   . Heart disease Mother        stent  . Cancer Father        prostate  . Hypertension Father   . Gout Father   . Heart disease Father        Stent  . Colon cancer Neg Hx   . Stomach cancer Neg Hx     DRUG ALLERGIES:  Allergies  Allergen Reactions  . Codeine Sulfate Nausea And Vomiting    REVIEW OF SYSTEMS:   CONSTITUTIONAL: No fever, fatigue or weakness.  EYES: No blurred or double vision.  EARS, NOSE, AND THROAT: No tinnitus or ear pain.  RESPIRATORY: No cough, shortness of breath, wheezing or  hemoptysis.  CARDIOVASCULAR: Has chest pain, no orthopnea, edema.  GASTROINTESTINAL: No nausea, vomiting, diarrhea or abdominal pain.  GENITOURINARY: No dysuria, hematuria.  ENDOCRINE: No polyuria, nocturia,  HEMATOLOGY: No anemia, easy bruising or bleeding SKIN: No rash or lesion. MUSCULOSKELETAL: No joint pain or arthritis.   NEUROLOGIC: No tingling, numbness, weakness.  PSYCHIATRY: No anxiety or depression.   MEDICATIONS AT HOME:  Prior to Admission medications   Medication Sig Start Date End Date Taking? Authorizing Provider  acetaminophen (TYLENOL) 500 MG tablet Take 1,000 mg by mouth 2 (two) times daily as needed for mild pain or moderate pain.    Yes [provider]  albuterol (PROAIR HFA) 108 (90 Base) MCG/ACT inhaler Inhale 2 puffs into the lungs daily as needed for wheezing or shortness of breath. 10/09/16  Yes Tillman Abide  I, MD  atorvastatin (LIPITOR) 20 MG tablet TAKE 1 TABLET BY MOUTH  DAILY Patient taking differently: TAKE 1 TABLET BY MOUTH  DAILY AT BEDTIME 12/01/17  Yes Marykay Lex, MD  clopidogrel (PLAVIX) 75 MG tablet TAKE 1 TABLET BY MOUTH  DAILY Patient taking differently: TAKE 1 TABLET BY MOUTH  DAILY AT BEDTIME 04/10/17  Yes Marykay Lex, MD  metoprolol succinate (TOPROL-XL) 25 MG 24 hr tablet Take 1 tablet (25 mg total) by mouth daily. Patient taking differently: Take 25 mg by mouth at bedtime.  04/17/17  Yes Marykay Lex, MD  nitroGLYCERIN (NITROSTAT) 0.4 MG SL tablet Place 1 tablet (0.4 mg total) under the tongue every 5 (five) minutes as needed for chest pain. 02/13/17  Yes Marykay Lex, MD  triamcinolone cream (KENALOG) 0.1 % Apply 1 application topically 2 (two) times daily as needed. Patient taking differently: Apply 1 application topically 2 (two) times daily as needed (irritation).  03/03/17  Yes Ryan Schwalbe, MD      PHYSICAL EXAMINATION:   VITAL SIGNS: Blood pressure (!) 147/81, pulse (!) 51, temperature 98 F (36.7 C), temperature source Oral, resp. rate 11, weight 62.6 kg (138 lb), SpO2 100 %.  GENERAL:  57 y.o.-year-old patient lying in the bed with no acute distress.  EYES: Pupils equal, round, reactive to light and accommodation. No scleral icterus. Extraocular muscles intact.  HEENT: Head atraumatic, normocephalic. Oropharynx and nasopharynx clear.  NECK:  Supple, no jugular venous distention. No thyroid enlargement, no tenderness.  LUNGS: Normal breath sounds bilaterally, no wheezing, rales,rhonchi or crepitation. No use of accessory muscles of respiration.  CARDIOVASCULAR: S1, S2 normal. No murmurs, rubs, or gallops.  ABDOMEN: Soft, nontender, nondistended. Bowel sounds present. No organomegaly or mass.  EXTREMITIES: No pedal edema, cyanosis, or clubbing.  NEUROLOGIC: Cranial nerves II through XII are intact. Muscle strength 5/5 in all extremities. Sensation  intact. Gait not checked.  PSYCHIATRIC: The patient is alert and oriented x 3.  SKIN: No obvious rash, lesion, or ulcer.   LABORATORY PANEL:   CBC Recent Labs  Lab 03/02/18 1904  WBC 5.6  HGB 13.8  HCT 39.8*  PLT 351  MCV 85.6  MCH 29.7  MCHC 34.7  RDW 13.0   ------------------------------------------------------------------------------------------------------------------  Chemistries  Recent Labs  Lab 03/02/18 1904  NA 132*  K 3.8  CL 96*  CO2 29  GLUCOSE 127*  BUN <5*  CREATININE 0.80  CALCIUM 9.2   ------------------------------------------------------------------------------------------------------------------ estimated creatinine clearance is 91.3 mL/min (by C-G formula based on SCr of 0.8 mg/dL). ------------------------------------------------------------------------------------------------------------------ No results for input(s): TSH, T4TOTAL, T3FREE, THYROIDAB in the last 72 hours.  Invalid input(s): FREET3  Coagulation profile No results for input(s): INR, PROTIME in the last 168 hours. ------------------------------------------------------------------------------------------------------------------- No results for input(s): DDIMER in the last 72 hours. -------------------------------------------------------------------------------------------------------------------  Cardiac Enzymes Recent Labs  Lab 03/02/18 1904  TROPONINI <0.03   ------------------------------------------------------------------------------------------------------------------ Invalid input(s): POCBNP  ---------------------------------------------------------------------------------------------------------------  Urinalysis No results found for: COLORURINE, APPEARANCEUR, LABSPEC, PHURINE, GLUCOSEU, HGBUR, BILIRUBINUR, KETONESUR, PROTEINUR, UROBILINOGEN, NITRITE, LEUKOCYTESUR   RADIOLOGY: Dg Chest 2 View  Result Date: 03/02/2018 CLINICAL DATA:  Chest pain EXAM: CHEST - 2  VIEW COMPARISON:  02/12/2016 FINDINGS: Hyperinflation. No acute consolidation or effusion. Biapical pleural and parenchymal scarring. Stable cardiomediastinal silhouette. No pneumothorax. IMPRESSION: No active cardiopulmonary disease. Hyperinflation with emphysematous changes. Electronically Signed   By: Jasmine Pang M.D.   On: 03/02/2018 19:23    EKG: Orders placed or performed during the hospital encounter of 03/02/18  . EKG 12-Lead  . EKG 12-Lead  . ED EKG within 10 minutes  . ED EKG within 10 minutes    IMPRESSION AND PLAN:  57 year old male patient with history of STEMI, coronary disease, cardiac stent, COPD presented to emergency room with chest pain  -Unstable angina Admit patient to telemetry Cycle troponin and check echocardiogram Cardiology evaluation Cardiac stress test  -Coronary disease with history of cardiac stent Continue aspirin and Plavix and statin medication  -Hyperlipidemia Continue statin medication  -Tobacco cessation counseled for 6 minutes Nicotine patch offered  -DVT prophylaxis subcu Lovenox daily  All the records are reviewed and case discussed with ED provider. Management plans discussed with the patient, family and they are in agreement.  CODE STATUS:Full code Code Status History    Date Active Date Inactive Code Status Order ID Comments User Context   05/29/2014 1354 05/31/2014 1614 Full Code [id number]  Marykay Lex, MD Inpatient       TOTAL TIME TAKING CARE OF THIS PATIENT: 52 minutes.    Ihor Austin M.D on 03/02/2018 at 8:40 PM  Between 7am to 6pm - Pager - [phone]  After 6pm go to [email or web address] - password [email or web address] [facility]  [facility]  Office  [phone]  CC: Primary care physician; Ryan Schwalbe, MD

## 2018-03-02 NOTE — Progress Notes (Signed)
Advanced care plan. Purpose of the Encounter: CODE STATUS Parties in Attendance: Patient and family Patient's Decision Capacity: Good Subjective/Patient's story: Presented to emergency room for chest pain Objective/Medical story Has history of coronary artery disease and cardiac stent Needs cardiac work-up Goals of care determination:  Advance care directives and goals of care discussed Patient wants everything done which includes CPR and intubation if need arises CODE STATUS: Full code Time spent discussing advanced care planning: 16 minutes

## 2018-03-02 NOTE — ED Triage Notes (Signed)
Pt c/o left sided chest burn x1 hour. Pt denies SOB, N.V. Pt had previous MI in 2015 with stent placement. Pt reports burning is same.

## 2018-03-03 ENCOUNTER — Encounter: Payer: Self-pay | Admitting: Radiology

## 2018-03-03 ENCOUNTER — Observation Stay (HOSPITAL_BASED_OUTPATIENT_CLINIC_OR_DEPARTMENT_OTHER)
Admit: 2018-03-03 | Discharge: 2018-03-03 | Disposition: A | Discharge status: Home / Self Care | Payer: 59 | Attending: Internal Medicine | Admitting: Internal Medicine

## 2018-03-03 ENCOUNTER — Observation Stay (HOSPITAL_BASED_OUTPATIENT_CLINIC_OR_DEPARTMENT_OTHER): Payer: 59

## 2018-03-03 DIAGNOSIS — R079 Chest pain, unspecified: Secondary | ICD-10-CM | POA: Diagnosis not present

## 2018-03-03 DIAGNOSIS — I25110 Atherosclerotic heart disease of native coronary artery with unstable angina pectoris: Secondary | ICD-10-CM | POA: Diagnosis not present

## 2018-03-03 DIAGNOSIS — I2510 Atherosclerotic heart disease of native coronary artery without angina pectoris: Secondary | ICD-10-CM

## 2018-03-03 DIAGNOSIS — I200 Unstable angina: Secondary | ICD-10-CM | POA: Diagnosis not present

## 2018-03-03 DIAGNOSIS — E785 Hyperlipidemia, unspecified: Secondary | ICD-10-CM | POA: Diagnosis not present

## 2018-03-03 HISTORY — PX: TRANSTHORACIC ECHOCARDIOGRAM: SHX275

## 2018-03-03 HISTORY — PX: NM MYOVIEW (ARMC HX): HXRAD1857

## 2018-03-03 LAB — NM MYOCAR MULTI W/SPECT W/WALL MOTION / EF
LV dias vol: 140 mL (ref 62–150)
LV sys vol: 57 mL
NUC STRESS TID: 1.07
Peak HR: 74 {beats}/min
Percent HR: 45 %
Rest HR: 53 {beats}/min

## 2018-03-03 LAB — LIPID PANEL
CHOL/HDL RATIO: 2.9 ratio
Cholesterol: 85 mg/dL (ref 0–200)
HDL: 29 mg/dL — AB (ref >40)
LDL CALC: 44 mg/dL (ref 0–99)
Triglycerides: 58 mg/dL (ref <150)
VLDL: 12 mg/dL (ref 0–40)

## 2018-03-03 LAB — BASIC METABOLIC PANEL
Anion gap: 8 (ref 5–15)
BUN: 6 mg/dL (ref 6–20)
CO2: 26 mmol/L (ref 22–32)
CREATININE: 0.66 mg/dL (ref 0.61–1.24)
Calcium: 8.8 mg/dL — ABNORMAL LOW (ref 8.9–10.3)
Chloride: 99 mmol/L (ref 98–111)
GFR calc Af Amer: >60 mL/min (ref >60)
Glucose, Bld: 108 mg/dL — ABNORMAL HIGH (ref 70–99)
Potassium: 3.1 mmol/L — ABNORMAL LOW (ref 3.5–5.1)
SODIUM: 133 mmol/L — AB (ref 135–145)

## 2018-03-03 LAB — ECHOCARDIOGRAM COMPLETE
Height: 70 in
WEIGHTICAEL: 2228.8 [oz_av]

## 2018-03-03 LAB — TROPONIN I
Troponin I: <0.03 ng/mL (ref <0.03)
Troponin I: <0.03 ng/mL (ref <0.03)
Troponin I: <0.03 ng/mL (ref <0.03)

## 2018-03-03 MED ORDER — PERFLUTREN LIPID MICROSPHERE
1.0000 mL | INTRAVENOUS | Status: AC | PRN
  Administered 2018-03-03: 2 mL via INTRAVENOUS
  Filled 2018-03-03: qty 10

## 2018-03-03 MED ORDER — ISOSORBIDE MONONITRATE ER 30 MG PO TB24: 30.0000 mg | ORAL_TABLET | Freq: Every day | ORAL | 0 refills | Status: DC

## 2018-03-03 MED ORDER — POTASSIUM CHLORIDE CRYS ER 20 MEQ PO TBCR
40.0000 meq | EXTENDED_RELEASE_TABLET | Freq: Once | ORAL | Status: AC
  Administered 2018-03-03: 40 meq via ORAL
  Filled 2018-03-03: qty 2

## 2018-03-03 MED ORDER — REGADENOSON 0.4 MG/5ML IV SOLN
0.4000 mg | Freq: Once | INTRAVENOUS | Status: AC
  Administered 2018-03-03: 0.4 mg via INTRAVENOUS
  Filled 2018-03-03: qty 5

## 2018-03-03 MED ORDER — TECHNETIUM TC 99M TETROFOSMIN IV KIT
32.9000 | PACK | Freq: Once | INTRAVENOUS | Status: AC | PRN
  Administered 2018-03-03: 32.9 via INTRAVENOUS

## 2018-03-03 MED ORDER — TECHNETIUM TC 99M TETROFOSMIN IV KIT
13.7000 | PACK | Freq: Once | INTRAVENOUS | Status: AC | PRN
  Administered 2018-03-03: 13.7 via INTRAVENOUS

## 2018-03-03 NOTE — Discharge Instructions (Signed)
Heart healthy diet

## 2018-03-03 NOTE — Consult Note (Signed)
Cardiology Consultation:   Patient ID: Ryan Cantrell; [id number]; Aug 29, 1961   Admit date: 03/02/2018 Date of Consult: 03/03/2018  Primary Care Provider: Karie Schwalbe, MD Primary Cardiologist: Bryan Lemma, MD Primary Electrophysiologist:  None   Patient Profile:   Ryan Cantrell is a 57 y.o. male with a hx of coronary artery disease status post inferior STEMI in 2015, COPD, and prior tobacco use who is being seen today for the evaluation of chest pain at the request of Dr. Elpidio Anis.  History of Present Illness:   Ryan Cantrell was in his usual state of health until yesterday afternoon while at work.  He was at work yesterday morning when he began to notice burning along the left side of his chest.  Pain would wax and wane in intensity, with a maximal intensity of 7/10.  It seemed to be exacerbated somewhat by activity and intermittently radiated to the left arm.  There were no associated symptoms.  However, given that the pain was similar to what he experienced at the time of his MI in 2015, he presented to the emergency department for further evaluation.  There, work-up was unrevealing with negative troponin.  Since being admitted, he notes that the discomfort has improved, though intermittently he has still had a vague pain that he rates as 1-2/10.  At this time, he is chest pain-free.  Past Medical History:  Diagnosis Date  . CAD S/P percutaneous coronary angioplasty: Inf STEMI - 100% dRCA - PCI Promus P 2.5 mm x 12mm (2.59mm) 05/29/2014   a. MI 05/29/14 dRCA PCI - Promus Premier DES 2.5 mm x 12 mm (2.8 mm); b. Echo: EF 55-60%, mild basal-inferior HK, normal DD  . COPD (chronic obstructive pulmonary disease) (HCC)     PFTs 2013.. Mod severe COPD...   . Emphysema   . ST elevation myocardial infarction (STEMI) of inferior wall, initial episode of care (HCC) 05/29/2014   Echocardiogram 05/30/2014: EF 55-60%. Mild HK of the basal inferior wall. Normal valves    Past Surgical History:    Procedure Laterality Date  . CARDIAC CATHETERIZATION  05/29/2014   a. MI 05/29/14 dRCA 100%; D1 ~45%, Normal EF __> PCI to RCA  . Fracture left humerus  2009   no surgery. Torn rotator cuff also  . INGUINAL HERNIA REPAIR     twice on right and once on left-- 1980's-90's  . PERCUTANEOUS CORONARY STENT INTERVENTION (PCI-S)     dRCA - Promus Premie DES 2.5 mm x 12 mm (2.8 mm)  . Right shoulder surgery  1987     Home Medications:  Prior to Admission medications   Medication Sig Start Date Kanyia Heaslip Date Taking? Authorizing Provider  acetaminophen (TYLENOL) 500 MG tablet Take 1,000 mg by mouth 2 (two) times daily as needed for mild pain or moderate pain.    Yes [provider]  albuterol (PROAIR HFA) 108 (90 Base) MCG/ACT inhaler Inhale 2 puffs into the lungs daily as needed for wheezing or shortness of breath. 10/09/16  Yes Tillman Abide I, MD  atorvastatin (LIPITOR) 20 MG tablet TAKE 1 TABLET BY MOUTH  DAILY Patient taking differently: TAKE 1 TABLET BY MOUTH  DAILY AT BEDTIME 12/01/17  Yes Marykay Lex, MD  metoprolol succinate (TOPROL-XL) 25 MG 24 hr tablet Take 1 tablet (25 mg total) by mouth daily. Patient taking differently: Take 25 mg by mouth at bedtime.  04/17/17  Yes Marykay Lex, MD  nitroGLYCERIN (NITROSTAT) 0.4 MG SL tablet Place 1 tablet (0.4  mg total) under the tongue every 5 (five) minutes as needed for chest pain. 02/13/17  Yes Marykay LexHarding, David W, MD  triamcinolone cream (KENALOG) 0.1 % Apply 1 application topically 2 (two) times daily as needed. Patient taking differently: Apply 1 application topically 2 (two) times daily as needed (irritation).  03/03/17  Yes Karie SchwalbeLetvak, Richard I, MD  clopidogrel (PLAVIX) 75 MG tablet Take 1 tablet (75 mg total) by mouth daily. 03/03/18   Marykay LexHarding, David W, MD  isosorbide mononitrate (IMDUR) 30 MG 24 hr tablet Take 1 tablet (30 mg total) by mouth daily. 03/03/18 04/02/18  Milagros LollSudini, Srikar, MD    Inpatient Medications: Scheduled Meds: . aspirin  324  mg Oral NOW   Or  . aspirin  300 mg Rectal NOW  . aspirin EC  81 mg Oral Daily  . atorvastatin  20 mg Oral QPM  . clopidogrel  75 mg Oral QPM  . enoxaparin (LOVENOX) injection  40 mg Subcutaneous Q24H  . metoprolol succinate  25 mg Oral Daily  . sodium chloride flush  3 mL Intravenous Q12H   Continuous Infusions: . sodium chloride     PRN Meds: sodium chloride, acetaminophen, albuterol, nitroGLYCERIN, ondansetron (ZOFRAN) IV, sodium chloride flush  Allergies:    Allergies  Allergen Reactions  . Codeine Sulfate Nausea And Vomiting    Social History:   Social History   Socioeconomic History  . Marital status: Married    Spouse name: Not on file  . Number of children: 1  . Years of education: Not on file  . Highest education level: Not on file  Occupational History  . Occupation: Control and instrumentation engineerress operator  Social Needs  . Financial resource strain: Not on file  . Food insecurity:    Worry: Not on file    Inability: Not on file  . Transportation needs:    Medical: Not on file    Non-medical: Not on file  Tobacco Use  . Smoking status: Former Smoker    Packs/day: 1.00    Types: E-cigarettes    Last attempt to quit: 05/29/2014    Years since quitting: 3.7  . Smokeless tobacco: Never Used  . Tobacco comment: occ e-cigarette--no cigarettes since MI  Substance and Sexual Activity  . Alcohol use: No    Alcohol/week: 0.0 oz  . Drug use: No  . Sexual activity: Yes  Lifestyle  . Physical activity:    Days per week: Not on file    Minutes per session: Not on file  . Stress: Not on file  Relationships  . Social connections:    Talks on phone: Not on file    Gets together: Not on file    Attends religious service: Not on file    Active member of club or organization: Not on file    Attends meetings of clubs or organizations: Not on file    Relationship status: Not on file  . Intimate partner violence:    Fear of current or ex partner: Not on file    Emotionally abused: Not on  file    Physically abused: Not on file    Forced sexual activity: Not on file  Other Topics Concern  . Not on file  Social History Narrative   Married - 1 child.   Smokes 1/2 ppd.   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    Family History:   Family History  Problem Relation Age of Onset  . Hypertension Mother   .  Diabetes Mother   . Heart disease Mother        stent  . Cancer Father        prostate  . Hypertension Father   . Gout Father   . Heart disease Father        Stent  . Colon cancer Neg Hx   . Stomach cancer Neg Hx      ROS:  Please see the history of present illness. All other ROS reviewed and negative.     Physical Exam/Data:   Vitals:   03/02/18 2138 03/03/18 0308 03/03/18 0750 03/03/18 1209  BP: (!) 151/84 113/73 111/65 133/78  Pulse: (!) 55 (!) 59 (!) 51 (!) 53  Resp: 18 18 16    Temp: 97.9 F (36.6 C) 98.2 F (36.8 C) 98 F (36.7 C)   TempSrc: Oral  Oral   SpO2: 96% 98% 97%   Weight: 139 lb 4.8 oz (63.2 kg)     Height: 5\' 10"  (1.778 m)       Intake/Output Summary (Last 24 hours) at 03/03/2018 1550 Last data filed at 03/03/2018 0300 Gross per 24 hour  Intake 243 ml  Output 100 ml  Net 143 ml   Filed Weights   03/02/18 1902 03/02/18 2138  Weight: 138 lb (62.6 kg) 139 lb 4.8 oz (63.2 kg)   Body mass index is 19.99 kg/m.  General:  Well nourished, well developed, in no acute distress HEENT: normal Lymph: no adenopathy Neck: no JVD Endocrine:  No thryomegaly Vascular: No carotid bruits; 2+ radial and pedal pulses bilaterally Cardiac:  normal S1, S2; RRR; no murmurs or rubs, or gallops Lungs: Mildly diminished breath sounds throughout without wheezes or crackles. Abd: soft, nontender, no hepatomegaly  Ext: no edema Musculoskeletal:  No deformities, BUE and BLE strength normal and equal Skin: warm and dry  Neuro:  CNs 2-12 intact, no focal abnormalities noted Psych:  Normal affect   EKG:  The EKG was personally  reviewed and demonstrates: Normal sinus rhythm with right bundle branch block and nonspecific intraventricular conduction delay. Telemetry:  Telemetry was personally reviewed and demonstrates: Normal sinus rhythm.  Relevant CV Studies: Echo (03/03/18): - Left ventricle: The cavity size was at the upper limits of   normal. Wall thickness was normal. Systolic function was normal.   The estimated ejection fraction was in the range of 55% to 60%.   Wall motion was normal; there were no regional wall motion   abnormalities. Left ventricular diastolic function parameters   were normal. - Right ventricle: The cavity size was normal. Wall thickness was   normal. Systolic function was normal. - No significant valvular abnormalities.  Pharmacologic myocardial perfusion stress test (03/03/18):  Low risk, probably normal pharmacologic myocardial perfusion stress test.  There is a small in size, severe, partially reversible apical defect that most likely represents artifact and less likely a small area of scar with periinfarct ischemia.  The left ventricular ejection fraction is normal (55-65%) with normal regional wall motion.   Laboratory Data:  Chemistry Recent Labs  Lab 03/02/18 1904 03/03/18 0131  NA 132* 133*  K 3.8 3.1*  CL 96* 99  CO2 29 26  GLUCOSE 127* 108*  BUN <5* 6  CREATININE 0.80 0.66  CALCIUM 9.2 8.8*  GFRNONAA >60 >60  GFRAA >60 >60  ANIONGAP 7 8    No results for input(s): PROT, ALBUMIN, AST, ALT, ALKPHOS, BILITOT in the last 168 hours. Hematology Recent Labs  Lab 03/02/18 1904  WBC 5.6  RBC 4.65  HGB 13.8  HCT 39.8*  MCV 85.6  MCH 29.7  MCHC 34.7  RDW 13.0  PLT 351   Cardiac Enzymes Recent Labs  Lab 03/02/18 1904 03/03/18 0125 03/03/18 0732 03/03/18 1340  TROPONINI <0.03 <0.03 <0.03 <0.03   No results for input(s): TROPIPOC in the last 168 hours.  BNPNo results for input(s): BNP, PROBNP in the last 168 hours.  DDimer No results for input(s):  DDIMER in the last 168 hours.  Radiology/Studies:  Dg Chest 2 View  Result Date: 03/02/2018 CLINICAL DATA:  Chest pain EXAM: CHEST - 2 VIEW COMPARISON:  02/12/2016 FINDINGS: Hyperinflation. No acute consolidation or effusion. Biapical pleural and parenchymal scarring. Stable cardiomediastinal silhouette. No pneumothorax. IMPRESSION: No active cardiopulmonary disease. Hyperinflation with emphysematous changes. Electronically Signed   By: Jasmine Pang M.D.   On: 03/02/2018 19:23   Nm Myocar Multi W/spect W/wall Motion / Ef  Result Date: 03/03/2018  Low risk, probably normal pharmacologic myocardial perfusion stress test.  There is a small in size, severe, partially reversible apical defect that most likely represents artifact and less likely a small area of scar with periinfarct ischemia.  The left ventricular ejection fraction is normal (55-65%) with normal regional wall motion.     Assessment and Plan:   Coronary artery disease with unstable angina Acute onset of chest pain is concerning for unstable angina.  However, pain has spontaneously resolved.  Myocardial perfusion stress test today was low risk.  I think it is reasonable to continue with medical therapy and have close follow-up with his primary cardiologist, Dr. Herbie Baltimore.  Initiate isosorbide mononitrate 30 mg daily.  Continue metoprolol succinate 25 mg daily.  Bradycardia limits further up titration at this time.  Continue aspirin 81 mg daily and clopidogrel 75 mg daily.  Hyperlipidemia Goal LDL less than 70, given history of STEMI.  Most recent LDL during this admission was 44.  Continue atorvastatin 20 mg daily.  Disposition  Discharge home with outpatient follow-up with Dr. Herbie Baltimore or APP at [facility] office in [place].  For questions or updates, please contact [facility] Please consult [email or web address] for contact info under [facility].   Signed, Yvonne Kendall, MD  03/03/2018 3:50 PM

## 2018-03-03 NOTE — Progress Notes (Signed)
Elizbeth SquiresMark A Koffler to be D/C'd Home per MD order. Patient given discharge teaching and paperwork regarding medications, diet, follow-up appointments and activity. Patient understanding verbalized. No questions or complaints at this time. Skin condition as charted. IV and telemetry removed prior to leaving.  No further needs by Care Management/Social Work. Prescriptions e-prescribed by MD.  An After Visit Summary was printed and given to the patient.     Clayborne DanaBeck, Dwayn Moravek B

## 2018-03-03 NOTE — Plan of Care (Signed)
  Problem: Activity: Goal: Risk for activity intolerance will decrease Outcome: Progressing   Problem: Pain Managment: Goal: General experience of comfort will improve Outcome: Progressing   Problem: Education: Goal: Knowledge of General Education information will improve Outcome: Completed/Met

## 2018-03-03 NOTE — Progress Notes (Signed)
*  PRELIMINARY RESULTS* Echocardiogram 2D Echocardiogram has been performed.  Ryan Cantrell 03/03/2018, 9:26 AM

## 2018-03-04 ENCOUNTER — Telehealth: Payer: Self-pay | Admitting: *Deleted

## 2018-03-04 ENCOUNTER — Telehealth: Payer: Self-pay | Admitting: Cardiology

## 2018-03-04 LAB — HIV ANTIBODY (ROUTINE TESTING W REFLEX): HIV Screen 4th Generation wRfx: NONREACTIVE

## 2018-03-04 NOTE — Telephone Encounter (Signed)
Imdur won't hurt him to try.  May get a little HA - to avoid this, take 30 min after ASA (or if not on ASA, Tylenol).  The idea was to treat possible coronary spasm.  Bryan Lemmaavid Sukhman Martine, MD

## 2018-03-04 NOTE — Telephone Encounter (Signed)
New message    Pt c/o medication issue:  1. Name of Medication: isosorbide mononitrate (IMDUR) 30 MG 24 hr tablet  2. How are you currently taking this medication (dosage and times per day)?   3. Are you having a reaction (difficulty breathing--STAT)? NO  4. What is your medication issue? Patient has concerns about taking medication

## 2018-03-04 NOTE — Telephone Encounter (Signed)
Spoke to the patient who has concerns starting the Imdur medication.  I informed him to start on 7/4 as scheduled and let us know if he feels any side effects from it.  He verbalized understanding and just wanted to let us know that he is apprehensive and concerned.

## 2018-03-04 NOTE — Telephone Encounter (Signed)
Lm requesting return call to complete TCM and confirm hosp f/u appt  

## 2018-03-06 NOTE — Telephone Encounter (Signed)
Transition Care Management Follow-up Telephone Call   Date discharged? 03/03/2018   How have you been since you were released from the hospital? "ok"   Do you understand why you were in the hospital? yes   Do you understand the discharge instructions? yes   Where were you discharged to? home   Items Reviewed:  Medications reviewed: yes  Allergies reviewed: yes  Dietary changes reviewed: yes  Referrals reviewed: yes   Functional Questionnaire:   Activities of Daily Living (ADLs):   He states they are independent in the following: independent in all areas    Any transportation issues/concerns?: no   Any patient concerns? no   Confirmed importance and date/time of follow-up visits scheduled yes  Provider Appointment booked with Dr Alphonsus SiasLetvak 7/8 @ 1630  Confirmed with patient if condition begins to worsen call PCP or go to the ER.  Patient was given the office number and encouraged to call back with question or concerns.  : yes

## 2018-03-09 ENCOUNTER — Other Ambulatory Visit: Payer: Self-pay | Admitting: Cardiology

## 2018-03-09 ENCOUNTER — Ambulatory Visit: Payer: 59 | Admitting: Internal Medicine

## 2018-03-09 ENCOUNTER — Encounter: Payer: Self-pay | Admitting: Internal Medicine

## 2018-03-09 VITALS — BP 110/70 | HR 68 | Temp 98.6°F | Ht 69.5 in | Wt 145.0 lb

## 2018-03-09 DIAGNOSIS — I25119 Atherosclerotic heart disease of native coronary artery with unspecified angina pectoris: Secondary | ICD-10-CM

## 2018-03-09 DIAGNOSIS — I2510 Atherosclerotic heart disease of native coronary artery without angina pectoris: Secondary | ICD-10-CM | POA: Insufficient documentation

## 2018-03-09 NOTE — Progress Notes (Signed)
Subjective:    Patient ID: Ryan SquiresMark A Frede, male    DOB: Jul 17, 1961, 57 y.o.   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 Does have some headache--not necessarily new  Current Outpatient Medications on File Prior to Visit  Medication Sig Dispense Refill  . acetaminophen (TYLENOL) 500 MG tablet Take 1,000 mg by mouth 2 (two) times daily as needed for mild pain or moderate pain.     Marland Kitchen. albuterol (PROAIR HFA) 108 (90 Base) MCG/ACT inhaler Inhale 2 puffs into the lungs daily as needed for wheezing or shortness of breath. 1 Inhaler 0  . atorvastatin (LIPITOR) 20 MG tablet TAKE 1 TABLET BY MOUTH  DAILY (Patient taking differently: TAKE 1 TABLET BY MOUTH  DAILY AT BEDTIME) 90 tablet 1  . clopidogrel (PLAVIX) 75 MG tablet Take 1 tablet (75 mg total) by mouth daily. 90 tablet 0  . isosorbide mononitrate (IMDUR) 30 MG 24 hr tablet Take 1 tablet (30 mg total) by mouth daily. 30 tablet 0  . metoprolol succinate (TOPROL-XL) 25 MG 24 hr tablet Take 1 tablet (25 mg total) by mouth daily. (Patient taking differently: Take 25 mg by mouth at bedtime. ) 90 tablet 3  . nitroGLYCERIN (NITROSTAT) 0.4 MG SL tablet Place 1 tablet (0.4 mg total) under the tongue every 5 (five) minutes as needed for chest pain. 25 tablet 3  . triamcinolone cream (KENALOG) 0.1 % Apply 1 application topically 2 (two) times daily as needed. (Patient taking differently: Apply  1 application topically 2 (two) times daily as needed (irritation). ) 45 g 1   No current facility-administered medications on file prior to visit.     Allergies  Allergen Reactions  . Codeine Sulfate Nausea And Vomiting    Past Medical History:  Diagnosis Date  . CAD S/P percutaneous coronary angioplasty: Inf STEMI - 100% dRCA - PCI Promus P 2.5 mm x 12mm (2.748mm) 05/29/2014   a. MI 05/29/14 dRCA PCI - Promus Premier DES 2.5 mm x 12 mm (2.8 mm); b. Echo: EF 55-60%, mild basal-inferior HK, normal DD  . COPD (chronic obstructive pulmonary disease) (HCC)     PFTs 2013.. Mod severe COPD...   . Emphysema   . ST elevation myocardial infarction (STEMI) of inferior wall, initial episode of care (HCC) 05/29/2014   Echocardiogram 05/30/2014: EF 55-60%. Mild HK of the basal inferior wall. Normal valves    Past Surgical History:  Procedure Laterality Date  . CARDIAC CATHETERIZATION  05/29/2014   a. MI 05/29/14 dRCA 100%; D1 ~45%, Normal EF __> PCI to RCA  . Fracture left humerus  2009   no surgery. Torn rotator cuff also  . INGUINAL HERNIA REPAIR     twice on right and once on left-- 1980's-90's  . PERCUTANEOUS CORONARY STENT INTERVENTION (PCI-S)     dRCA - Promus Premie DES 2.5 mm x 12 mm (2.8 mm)  . Right shoulder surgery  1987  Family History  Problem Relation Age of Onset  . Hypertension Mother   . Diabetes Mother   . Heart disease Mother        stent  . Cancer Father        prostate  . Hypertension Father   . Gout Father   . Heart disease Father        Stent  . Colon cancer Neg Hx   . Stomach cancer Neg Hx     Social History   Socioeconomic History  . Marital status: Married    Spouse name: Not on file  . Number of children: 1  . Years of education: Not on file  . Highest education level: Not on file  Occupational History  . Occupation: Control and instrumentation engineer  Social Needs  . Financial resource strain: Not on file  . Food insecurity:    Worry: Not on file    Inability:  Not on file  . Transportation needs:    Medical: Not on file    Non-medical: Not on file  Tobacco Use  . Smoking status: Former Smoker    Packs/day: 1.00    Types: E-cigarettes    Last attempt to quit: 05/29/2014    Years since quitting: 3.7  . Smokeless tobacco: Never Used  . Tobacco comment: occ e-cigarette--no cigarettes since MI  Substance and Sexual Activity  . Alcohol use: No    Alcohol/week: 0.0 oz  . Drug use: No  . Sexual activity: Yes  Lifestyle  . Physical activity:    Days per week: Not on file    Minutes per session: Not on file  . Stress: Not on file  Relationships  . Social connections:    Talks on phone: Not on file    Gets together: Not on file    Attends religious service: Not on file    Active member of club or organization: Not on file    Attends meetings of clubs or organizations: Not on file    Relationship status: Not on file  . Intimate partner violence:    Fear of current or ex partner: Not on file    Emotionally abused: Not on file    Physically abused: Not on file    Forced sexual activity: Not on file  Other Topics Concern  . Not on file  Social History Narrative   Married - 1 child.   Smokes 1/2 ppd.   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   Review of Systems Has never really had heartburn No change in usual eating--does have occasional fountain drinks Doesn't relate this to his symptoms  Has cut back on smoking (though only e-cigs) Appetite is improved    Objective:   Physical Exam  Constitutional: He appears well-developed. No distress.  Neck: No thyromegaly present.  Cardiovascular: Normal rate, regular rhythm and normal heart sounds. Exam reveals no gallop.  No murmur heard. Respiratory: Effort normal and breath sounds normal. No respiratory distress. He has no wheezes. He has no rales. He exhibits no tenderness.  GI: Soft. There is no tenderness.  Musculoskeletal: He exhibits no edema.    Lymphadenopathy:    He has no cervical adenopathy.  Psychiatric: He has a normal mood and affect. His behavior is normal.           Assessment & Plan:

## 2018-03-09 NOTE — Assessment & Plan Note (Signed)
Pain was similar to MI Likely angina---but no major vessel threatened per stress test Echo shows normal EF Has cut back on e-cigs Mostly tolerating the isosorbide Did discuss trying the nitro if similar episode--then calling 911 if pain persists

## 2018-03-09 NOTE — Addendum Note (Signed)
Addended by: Tillman AbideLETVAK, RICHARD I on: 03/09/2018 04:54 PM   Modules accepted: Orders

## 2018-03-10 ENCOUNTER — Other Ambulatory Visit: Payer: Self-pay | Admitting: Internal Medicine

## 2018-03-10 DIAGNOSIS — E871 Hypo-osmolality and hyponatremia: Secondary | ICD-10-CM

## 2018-03-10 LAB — RENAL FUNCTION PANEL
Albumin: 4.4 g/dL (ref 3.5–5.2)
BUN: 8 mg/dL (ref 6–23)
CALCIUM: 9.2 mg/dL (ref 8.4–10.5)
CO2: 27 meq/L (ref 19–32)
Chloride: 91 mEq/L — ABNORMAL LOW (ref 96–112)
Creatinine, Ser: 0.81 mg/dL (ref 0.40–1.50)
GFR: 104.65 mL/min (ref >60.00)
Glucose, Bld: 98 mg/dL (ref 70–99)
POTASSIUM: 3.6 meq/L (ref 3.5–5.1)
Phosphorus: 3.7 mg/dL (ref 2.3–4.6)
Sodium: 125 mEq/L — ABNORMAL LOW (ref 135–145)

## 2018-03-10 NOTE — Telephone Encounter (Signed)
Rx sent to pharmacy   

## 2018-03-13 NOTE — Discharge Summary (Signed)
[facility] [place] [facility]   PATIENT NAME: Ryan Cantrell    MR#:  [id number]  DATE OF BIRTH:  February 12, 1961  DATE OF ADMISSION:  03/02/2018 ADMITTING PHYSICIAN: Ryan AustinPavan Pyreddy, MD  DATE OF DISCHARGE: 03/03/2018  5:56 PM  PRIMARY CARE PHYSICIAN: Ryan Cantrell, Ryan I, MD   ADMISSION DIAGNOSIS:  Moderate coronary artery risk chest pain [R07.9]  DISCHARGE DIAGNOSIS:  Active Problems:   Chest pain   SECONDARY DIAGNOSIS:   Past Medical History:  Diagnosis Date  . CAD S/P percutaneous coronary angioplasty: Inf STEMI - 100% dRCA - PCI Promus P 2.5 mm x 12mm (2.438mm) 05/29/2014   Cantrell. MI 05/29/14 dRCA PCI - Promus Premier DES 2.5 mm x 12 mm (2.8 mm); b. Echo: EF 55-60%, mild basal-inferior HK, normal DD  . COPD (chronic obstructive pulmonary disease) (HCC)     PFTs 2013.. Mod severe COPD...   . Emphysema   . ST elevation myocardial infarction (STEMI) of inferior wall, initial episode of care (HCC) 05/29/2014   Echocardiogram 05/30/2014: EF 55-60%. Mild HK of the basal inferior wall. Normal valves     ADMITTING HISTORY  HISTORY OF PRESENT ILLNESS: Ryan GobbleMark Cantrell  is Cantrell 57 y.o. male with Cantrell known history of coronary artery disease, cardiac stent, STEMI and COPD presented to the emergency room with chest pain.  Patient was at work this morning when he noticed left-sided chest pain.  Pain is sharp in nature 7 out of 10 on Cantrell scale of 1-10.  Patient has been compliant with his medication.  No radiation of the pain.  Patient did not have any lightheadedness or any diaphoresis. First set of troponin is negative.  No complaints of any shortness of breath, orthopnea.  Hospitalist service was consulted for further care.  HOSPITAL COURSE:   *Coronary artery disease with angina Patient was admitted to telemetry floor.  Chest pain resolved spontaneously.  Troponin was normal and EKG showed no acute changes.  Due to patient's risk factors Cantrell stress test was done which was low risk.  Patient was  started on Imdur.  Continued on metoprolol, aspirin and Plavix.  Patient is also on atorvastatin.  Patient was seen by cardiology Dr. Okey DupreEnd.  Advised follow-up with outpatient cardiologist Dr. Herbie Cantrell.  Plan discussed with patient and answered all questions.  Discharged home in stable condition.  CONSULTS OBTAINED:  Treatment Team:  Ryan Cantrell, Ryan A, MD  DRUG ALLERGIES:   Allergies  Allergen Reactions  . Codeine Sulfate Nausea And Vomiting    DISCHARGE MEDICATIONS:   Allergies as of 03/03/2018      Reactions   Codeine Sulfate Nausea And Vomiting      Medication List    TAKE these medications   acetaminophen 500 MG tablet Commonly known as:  TYLENOL Take 1,000 mg by mouth 2 (two) times daily as needed for mild pain or moderate pain.   albuterol 108 (90 Base) MCG/ACT inhaler Commonly known as:  PROAIR HFA Inhale 2 puffs into the lungs daily as needed for wheezing or shortness of breath.   atorvastatin 20 MG tablet Commonly known as:  LIPITOR TAKE 1 TABLET BY MOUTH  DAILY What changed:    how much to take  how to take this  when to take this   clopidogrel 75 MG tablet Commonly known as:  PLAVIX Take 1 tablet (75 mg total) by mouth daily. What changed:    how much to take  how to take this  when to take this  isosorbide mononitrate 30 MG 24 hr tablet Commonly known as:  IMDUR Take 1 tablet (30 mg total) by mouth daily.   nitroGLYCERIN 0.4 MG SL tablet Commonly known as:  NITROSTAT Place 1 tablet (0.4 mg total) under the tongue every 5 (five) minutes as needed for chest pain.   triamcinolone cream 0.1 % Commonly known as:  KENALOG Apply 1 application topically 2 (two) times daily as needed. What changed:  reasons to take this       Today   VITAL SIGNS:  Blood pressure 133/78, pulse (!) 53, temperature 98 F (36.7 C), temperature source Oral, resp. rate 16, height 5\' 10"  (1.778 m), weight 63.2 kg (139 lb 4.8 oz), SpO2 97 %.  I/O:  No intake or  output data in the 24 hours ending 03/13/18 1509  PHYSICAL EXAMINATION:  Physical Exam  GENERAL:  57 y.o.-year-old patient lying in the bed with no acute distress.  LUNGS: Normal breath sounds bilaterally, no wheezing, rales,rhonchi or crepitation. No use of accessory muscles of respiration.  CARDIOVASCULAR: S1, S2 normal. No murmurs, rubs, or gallops.  ABDOMEN: Soft, non-tender, non-distended. Bowel sounds present. No organomegaly or mass.  NEUROLOGIC: Moves all 4 extremities. PSYCHIATRIC: The patient is alert and oriented x 3.  SKIN: No obvious rash, lesion, or ulcer.   DATA REVIEW:   CBC No results for input(s): WBC, HGB, HCT, PLT in the last 168 hours.  Chemistries  Recent Labs  Lab 03/09/18 1700  NA 125*  K 3.6  CL 91*  CO2 27  GLUCOSE 98  BUN 8  CREATININE 0.81  CALCIUM 9.2    Cardiac Enzymes No results for input(s): TROPONINI in the last 168 hours.  Microbiology Results  Results for orders placed or performed during the hospital encounter of 05/29/14  MRSA PCR Screening     Status: None   Collection Time: 05/29/14  1:49 PM  Result Value Ref Range Status   MRSA by PCR NEGATIVE NEGATIVE Final    Comment:        The GeneXpert MRSA Assay (FDA approved for NASAL specimens only), is one component of Cantrell comprehensive MRSA colonization surveillance program. It is not intended to diagnose MRSA infection nor to guide or monitor treatment for MRSA infections.    RADIOLOGY:  No results found.  Follow up with PCP in 1 week.  Management plans discussed with the patient, family and they are in agreement.  CODE STATUS:  Code Status History    Date Active Date Inactive Code Status Order ID Comments User Context   03/02/2018 2136 03/03/2018 2102 Full Code [id number]  Ryan Austin, MD Inpatient   05/29/2014 1354 05/31/2014 1614 Full Code [id number]  Ryan Lex, MD Inpatient      TOTAL TIME TAKING CARE OF THIS PATIENT ON DAY OF DISCHARGE: more than 30 minutes.    Ryan Cantrell M.Ryan on 03/13/2018 at 3:09 PM  Between 7am to 6pm - Pager - [phone]  After 6pm go to [email or web address] - password [email or web address]  [facility]  Office  [phone]  CC: Primary care physician; Ryan Schwalbe, MD  Note: This dictation was prepared with Dragon dictation along with smaller phrase technology. Any transcriptional errors that result from this process are unintentional.

## 2018-03-17 ENCOUNTER — Ambulatory Visit: Payer: 59 | Admitting: Physician Assistant

## 2018-03-17 ENCOUNTER — Encounter: Payer: Self-pay | Admitting: Physician Assistant

## 2018-03-17 VITALS — BP 110/68 | HR 63 | Ht 70.0 in | Wt 139.0 lb

## 2018-03-17 DIAGNOSIS — E871 Hypo-osmolality and hyponatremia: Secondary | ICD-10-CM | POA: Diagnosis not present

## 2018-03-17 DIAGNOSIS — J449 Chronic obstructive pulmonary disease, unspecified: Secondary | ICD-10-CM | POA: Diagnosis not present

## 2018-03-17 DIAGNOSIS — E876 Hypokalemia: Secondary | ICD-10-CM

## 2018-03-17 DIAGNOSIS — I2510 Atherosclerotic heart disease of native coronary artery without angina pectoris: Secondary | ICD-10-CM | POA: Diagnosis not present

## 2018-03-17 NOTE — Progress Notes (Addendum)
Cardiology Office Note    Date:  03/17/2018   ID:  Ryan Cantrell, DOB 10/02/1960, MRN [id number]  PCP:  Karie Schwalbe, MD  Cardiologist:  Dr. Herbie Baltimore  Chief Complaint  Patient presents with  . hospital follow up visit    pt states no Sx. He states the medication is helping and he has not had anymore chest pain.    History of Present Illness:  Ryan Cantrell is a 57 y.o. male with PMH of CAD s/p inferior STEMI 2015, COPD, and prior tobacco use.  More recently, patient was admitted to the hospital on 03/03/2018 for chest pain at work.  Myoview obtained on 03/03/2018 was low risk.  He was started on Imdur 30 mg daily for medical therapy.  His potassium was also low in the hospital, this has been repleted.  Echocardiogram obtained on 03/03/2018 showed EF 55 to 60%, no significant valvular issue.  Patient presents today for cardiology office visit.  He denies any recurrent chest pain or shortness of breath after starting on Imdur.  His blood pressure is quite stable on the current medication.  He is on Plavix monotherapy at this time.  Recent lipid panel showed a very well-controlled her total cholesterol, HDL and triglyceride, chronically low HDL.  He does not have any lower extremity edema, orthopnea or PND.  He can follow-up with Dr. Herbie Baltimore in 4 to 6 months.   Past Medical History:  Diagnosis Date  . CAD S/P percutaneous coronary angioplasty: Inf STEMI - 100% dRCA - PCI Promus P 2.5 mm x 12mm (2.67mm) 05/29/2014   a. MI 05/29/14 dRCA PCI - Promus Premier DES 2.5 mm x 12 mm (2.8 mm); b. Echo: EF 55-60%, mild basal-inferior HK, normal DD  . COPD (chronic obstructive pulmonary disease) (HCC)     PFTs 2013.. Mod severe COPD...   . Emphysema   . ST elevation myocardial infarction (STEMI) of inferior wall, initial episode of care (HCC) 05/29/2014   Echocardiogram 05/30/2014: EF 55-60%. Mild HK of the basal inferior wall. Normal valves    Past Surgical History:  Procedure Laterality Date  .  CARDIAC CATHETERIZATION  05/29/2014   a. MI 05/29/14 dRCA 100%; D1 ~45%, Normal EF __> PCI to RCA  . Fracture left humerus  2009   no surgery. Torn rotator cuff also  . INGUINAL HERNIA REPAIR     twice on right and once on left-- 1980's-90's  . PERCUTANEOUS CORONARY STENT INTERVENTION (PCI-S)     dRCA - Promus Premie DES 2.5 mm x 12 mm (2.8 mm)  . Right shoulder surgery  1987    Current Medications: Outpatient Medications Prior to Visit  Medication Sig Dispense Refill  . acetaminophen (TYLENOL) 500 MG tablet Take 1,000 mg by mouth 2 (two) times daily as needed for mild pain or moderate pain.     Marland Kitchen albuterol (PROAIR HFA) 108 (90 Base) MCG/ACT inhaler Inhale 2 puffs into the lungs daily as needed for wheezing or shortness of breath. 1 Inhaler 0  . atorvastatin (LIPITOR) 20 MG tablet TAKE 1 TABLET BY MOUTH  DAILY (Patient taking differently: TAKE 1 TABLET BY MOUTH  DAILY AT BEDTIME) 90 tablet 1  . clopidogrel (PLAVIX) 75 MG tablet Take 1 tablet (75 mg total) by mouth daily. 90 tablet 0  . isosorbide mononitrate (IMDUR) 30 MG 24 hr tablet Take 1 tablet (30 mg total) by mouth daily. 30 tablet 0  . metoprolol succinate (TOPROL-XL) 25 MG 24 hr tablet Take 1 tablet (  25 mg total) by mouth daily. Keep OV for further refills 90 tablet 0  . nitroGLYCERIN (NITROSTAT) 0.4 MG SL tablet Place 1 tablet (0.4 mg total) under the tongue every 5 (five) minutes as needed for chest pain. 25 tablet 3  . triamcinolone cream (KENALOG) 0.1 % Apply 1 application topically 2 (two) times daily as needed. (Patient taking differently: Apply 1 application topically 2 (two) times daily as needed (irritation). ) 45 g 1   No facility-administered medications prior to visit.      Allergies:   Codeine sulfate   Social History   Socioeconomic History  . Marital status: Married    Spouse name: Not on file  . Number of children: 1  . Years of education: Not on file  . Highest education level: Not on file  Occupational  History  . Occupation: Control and instrumentation engineerress operator  Social Needs  . Financial resource strain: Not on file  . Food insecurity:    Worry: Not on file    Inability: Not on file  . Transportation needs:    Medical: Not on file    Non-medical: Not on file  Tobacco Use  . Smoking status: Former Smoker    Packs/day: 1.00    Types: E-cigarettes    Last attempt to quit: 05/29/2014    Years since quitting: 3.8  . Smokeless tobacco: Never Used  . Tobacco comment: occ e-cigarette--no cigarettes since MI  Substance and Sexual Activity  . Alcohol use: No    Alcohol/week: 0.0 oz  . Drug use: No  . Sexual activity: Yes  Lifestyle  . Physical activity:    Days per week: Not on file    Minutes per session: Not on file  . Stress: Not on file  Relationships  . Social connections:    Talks on phone: Not on file    Gets together: Not on file    Attends religious service: Not on file    Active member of club or organization: Not on file    Attends meetings of clubs or organizations: Not on file    Relationship status: Not on file  Other Topics Concern  . Not on file  Social History Narrative   Married - 1 child.   Smokes 1/2 ppd.   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     Family History:  The patient's family history includes Cancer in his father; Diabetes in his mother; Gout in his father; Heart disease in his father and mother; Hypertension in his father and mother.   ROS:   Please see the history of present illness.    ROS All other systems reviewed and are negative.   PHYSICAL EXAM:   VS:  BP 110/68   Pulse 63   Ht 5\' 10"  (1.778 m)   Wt 139 lb (63 kg)   BMI 19.94 kg/m    GEN: Well nourished, well developed, in no acute distress  HEENT: normal  Neck: no JVD, carotid bruits, or masses Cardiac: RRR; no murmurs, rubs, or gallops,no edema  Respiratory:  clear to auscultation bilaterally, normal work of breathing GI: soft, nontender, nondistended, + BS MS:  no deformity or atrophy  Skin: warm and dry, no rash Neuro:  Alert and Oriented x 3, Strength and sensation are intact Psych: euthymic mood, full affect  Wt Readings from Last 3 Encounters:  03/17/18 139 lb (63 kg)  03/09/18 145 lb (65.8 kg)  03/02/18 139 lb 4.8 oz (63.2 kg)  Studies/Labs Reviewed:   EKG:  EKG is not ordered today.   Recent Labs: 01/14/2018: ALT 16 03/02/2018: Hemoglobin 13.8; Platelets 351 03/09/2018: BUN 8; Creatinine, Ser 0.81; Potassium 3.6; Sodium 125   Lipid Panel    Component Value Date/Time   CHOL 85 03/03/2018 0131   CHOL 100 10/31/2014 0810   TRIG 58 03/03/2018 0131   HDL 29 (L) 03/03/2018 0131   HDL 34 (L) 10/31/2014 0810   CHOLHDL 2.9 03/03/2018 0131   VLDL 12 03/03/2018 0131   LDLCALC 44 03/03/2018 0131   LDLCALC 41 10/31/2014 0810    Additional studies/ records that were reviewed today include:   Echo 03/03/2018 LV EF: 55% -   60% Study Conclusions  - Left ventricle: The cavity size was at the upper limits of   normal. Wall thickness was normal. Systolic function was normal.   The estimated ejection fraction was in the range of 55% to 60%.   Wall motion was normal; there were no regional wall motion   abnormalities. Left ventricular diastolic function parameters   were normal. - Right ventricle: The cavity size was normal. Wall thickness was   normal. Systolic function was normal. - No significant valvular abnormalities.   Myoview 03/03/2018  Low risk, probably normal pharmacologic myocardial perfusion stress test.  There is a small in size, severe, partially reversible apical defect that most likely represents artifact and less likely a small area of scar with periinfarct ischemia.  The left ventricular ejection fraction is normal (55-65%) with normal regional wall motion.  ASSESSMENT:    1. Coronary artery disease involving native coronary artery of native heart without angina pectoris   2. Chronic obstructive pulmonary  disease, unspecified COPD type (HCC)   3. Hypokalemia   4. Hyponatremia      PLAN:  In order of problems listed above:  1. CAD: Previous inferior STEMI in 2015, received DES to distal RCA, moderate residual disease in diagonal area.  Recently presented to the hospital with burning chest pain that is similar to the previous angina except this time he did not have any radiating pain down left arm.  Echocardiogram and Myoview both normal.  No further work-up is needed at this time.  He is on Plavix monotherapy.  Recent lipid panel showed a very well-controlled cholesterol, LDL, and triglyceride, HDL chronically low.  Recommend increase activity level.  2. COPD: No obvious sign of exacerbation.  3. Hypokalemia: Potassium 3.1 in the hospital, on repeat lab work, potassium has improved to 3.6.  4. Hyponatremia: He has chronic hyponatremia with baseline sodium around 133, most recent lab work shows sodium level of 125.  Likely related to excessive intake of free water.  He says since his discharge, he has been hydrating himself by drinking 6 bottles of 16 ounces bottled water per day.  Recommend to limit fluid intake to between 32 to 64 ounces per 24 hours    Medication Adjustments/Labs and Tests Ordered: Current medicines are reviewed at length with the patient today.  Concerns regarding medicines are outlined above.  Medication changes, Labs and Tests ordered today are listed in the Patient Instructions below. Patient Instructions  Medication Instructions:  Your physician recommends that you continue on your current medications as directed. Please refer to the Current Medication list given to you today.  Labwork: None   Testing/Procedures: None   Follow-Up: Your physician recommends that you schedule a follow-up appointment in: 4-6 months with Dr Herbie Baltimore.   Any Other Special Instructions Will Be  Listed Below (If Applicable). If you need a refill on your cardiac medications before your  next appointment, please call your pharmacy.     Ramond Dial, [place]  03/17/2018 1:17 PM    [facility] [address] [place], [place], [place]  [address] Phone: [phone]; Fax: [phone]

## 2018-03-17 NOTE — Patient Instructions (Signed)
Medication Instructions:  Your physician recommends that you continue on your current medications as directed. Please refer to the Current Medication list given to you today.  Labwork: None   Testing/Procedures: None   Follow-Up: Your physician recommends that you schedule a follow-up appointment in: 4-6 months with Dr Herbie BaltimoreHarding.   Any Other Special Instructions Will Be Listed Below (If Applicable). If you need a refill on your cardiac medications before your next appointment, please call your pharmacy.

## 2018-03-22 ENCOUNTER — Encounter: Payer: Self-pay | Admitting: Internal Medicine

## 2018-03-24 ENCOUNTER — Encounter: Payer: Self-pay | Admitting: Physician Assistant

## 2018-03-25 ENCOUNTER — Other Ambulatory Visit: Payer: Self-pay

## 2018-03-25 DIAGNOSIS — I2119 ST elevation (STEMI) myocardial infarction involving other coronary artery of inferior wall: Secondary | ICD-10-CM

## 2018-03-25 DIAGNOSIS — I2510 Atherosclerotic heart disease of native coronary artery without angina pectoris: Secondary | ICD-10-CM

## 2018-03-25 DIAGNOSIS — R079 Chest pain, unspecified: Secondary | ICD-10-CM

## 2018-03-25 MED ORDER — ISOSORBIDE MONONITRATE ER 30 MG PO TB24: 30.0000 mg | ORAL_TABLET | Freq: Every day | ORAL | 3 refills | Status: DC

## 2018-04-27 ENCOUNTER — Other Ambulatory Visit: Payer: Self-pay | Admitting: Cardiology

## 2018-04-28 NOTE — Telephone Encounter (Signed)
Rx sent to pharmacy   

## 2018-05-04 ENCOUNTER — Other Ambulatory Visit: Payer: Self-pay | Admitting: Cardiology

## 2018-05-06 ENCOUNTER — Other Ambulatory Visit: Payer: Self-pay | Admitting: Cardiology

## 2018-06-03 ENCOUNTER — Other Ambulatory Visit (INDEPENDENT_AMBULATORY_CARE_PROVIDER_SITE_OTHER): Payer: 59

## 2018-06-03 ENCOUNTER — Encounter: Payer: Self-pay | Admitting: Family Medicine

## 2018-06-03 ENCOUNTER — Ambulatory Visit: Payer: 59 | Admitting: Family Medicine

## 2018-06-03 VITALS — BP 130/80 | HR 65 | Temp 98.6°F | Ht 69.5 in | Wt 137.2 lb

## 2018-06-03 DIAGNOSIS — M2550 Pain in unspecified joint: Secondary | ICD-10-CM

## 2018-06-03 DIAGNOSIS — R51 Headache: Secondary | ICD-10-CM

## 2018-06-03 DIAGNOSIS — R519 Headache, unspecified: Secondary | ICD-10-CM

## 2018-06-03 NOTE — Progress Notes (Signed)
Dr. Karleen Hampshire T. Meriel Kelliher, MD, CAQ Sports [facility] [address] [place], [address] Phone: [phone] Fax: [phone]  06/03/2018  Patient: Ryan Cantrell, MRN: [id number], DOB: [birth year], 57 y.o.  Primary Physician:  Karie Schwalbe, MD   Chief Complaint  Patient presents with  . Headache  . Back Pain   Subjective:   Ryan Cantrell is a 57 y.o. very pleasant male patient who presents with the following:  57 yo male with history of CAD, MI, COPD who presents with headache, shoulder pain, back ache.   Headache with a vice grip on his head. Achy - mostly it is his. On further questioning, this is not new - it has really been a problem off and on for many months to years.   Does have some sweating at night. Intermittently maybe once a month. No fevers. No generalized arthralgia or myalgia.   No CP, no sob, no coughing.   Frequent daily headaches. 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  Off and on.  Recently decreased caffeine use.   Poor dentition? Very bad.   CMP CBC  Past Medical History, Surgical History, Social History, Family History, Problem List, Medications, and Allergies have been reviewed and updated if relevant.  Patient Active Problem List   Diagnosis Date Noted  . Atherosclerotic heart disease of native coronary artery with angina pectoris (HCC) 03/09/2018  . Chest pain 03/02/2018  . Erectile dysfunction 02/15/2017  . Eczema 07/21/2014  . Pulmonary emphysema (HCC)   . Borderline systolic hypertension 06/24/2014  . ST elevation myocardial infarction (STEMI) of inferior wall, subsequent episode of care [facility]) 05/29/2014    Class: Hospitalized for  . CAD S/P percutaneous coronary angioplasty: Inf STEMI - 100% dRCA - PCI Promus P 2.5 mm x 12mm (2.29mm) 05/29/2014    Class: Hospitalized for  . Former cigarette smoker 09/24/2012  . Routine general medical examination at a health care facility 06/22/2012  . Moderate   severe COPD (chronic obstructive pulmonary disease) 06/22/2012    Past Medical History:  Diagnosis Date  . CAD S/P percutaneous coronary angioplasty: Inf STEMI - 100% dRCA - PCI Promus P 2.5 mm x 12mm (2.34mm) 05/29/2014   a. MI 05/29/14 dRCA PCI - Promus Premier DES 2.5 mm x 12 mm (2.8 mm); b. Echo: EF 55-60%, mild basal-inferior HK, normal DD  . COPD (chronic obstructive pulmonary disease) (HCC)     PFTs 2013.. Mod severe COPD...   . Emphysema   . ST elevation myocardial infarction (STEMI) of inferior wall, initial episode of care (HCC) 05/29/2014   Echocardiogram 05/30/2014: EF 55-60%. Mild HK of the basal inferior wall. Normal valves    Past Surgical History:  Procedure Laterality Date  . CARDIAC CATHETERIZATION  05/29/2014   a. MI 05/29/14 dRCA 100%; D1 ~45%, Normal EF __> PCI to RCA  . Fracture left humerus  2009   no surgery. Torn rotator cuff also  . INGUINAL HERNIA REPAIR     twice on right and once on left-- 1980's-90's  . PERCUTANEOUS CORONARY STENT INTERVENTION (PCI-S)     dRCA - Promus Premie DES 2.5 mm x 12 mm (2.8 mm)  . Right shoulder surgery  1987    Social History   Socioeconomic History  . Marital status: Married    Spouse name: Not on file  . Number of children: 1  . Years of education: Not on file  . Highest education level: Not on file  Occupational History  . Occupation: Control and instrumentation engineer  Social Needs  . Financial resource strain: Not on file  . Food insecurity:    Worry: Not on file    Inability: Not on file  . Transportation needs:    Medical: Not on file    Non-medical: Not on file  Tobacco Use  . Smoking status: Former Smoker    Packs/day: 1.00    Types: E-cigarettes    Last attempt to quit: 05/29/2014    Years since quitting: 4.0  . Smokeless tobacco: Never Used  . Tobacco comment: occ e-cigarette--no cigarettes since MI  Substance and Sexual Activity  . Alcohol use: No    Alcohol/week: 0.0 standard drinks  . Drug use: No  . Sexual activity:  Yes  Lifestyle  . Physical activity:    Days per week: Not on file    Minutes per session: Not on file  . Stress: Not on file  Relationships  . Social connections:    Talks on phone: Not on file    Gets together: Not on file    Attends religious service: Not on file    Active member of club or organization: Not on file    Attends meetings of clubs or organizations: Not on file    Relationship status: Not on file  . Intimate partner violence:    Fear of current or ex partner: Not on file    Emotionally abused: Not on file    Physically abused: Not on file    Forced sexual activity: Not on file  Other Topics Concern  . Not on file  Social History Narrative   Married - 1 child.   Smokes 1/2 ppd.   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    Family History  Problem Relation Age of Onset  . Hypertension Mother   . Diabetes Mother   . Heart disease Mother        stent  . Cancer Father        prostate  . Hypertension Father   . Gout Father   . Heart disease Father        Stent  . Colon cancer Neg Hx   . Stomach cancer Neg Hx     Allergies  Allergen Reactions  . Codeine Sulfate Nausea And Vomiting    Medication list reviewed and updated in full in [facility].  ROS: GEN: Acute illness details above GI: Tolerating PO intake GU: maintaining adequate hydration and urination Pulm: No SOB Interactive and getting along well at home.  Otherwise, ROS is as per the HPI.  Objective:   BP 130/80   Pulse 65   Temp 98.6 F (37 C) (Oral)   Ht 5' 9.5" (1.765 m)   Wt 137 lb 4 oz (62.3 kg)   SpO2 97%   BMI 19.98 kg/m   GEN: WDWN, NAD, Non-toxic, A & O x 3 HEENT: Atraumatic, Normocephalic. Neck supple. No masses, No LAD. Extremely poor dentition. Ears and Nose: No external deformity. TM clear.  CV: RRR, No M/G/R. No JVD. No thrill. No extra heart sounds. PULM: CTA B, no wheezes, crackles, rhonchi. No retractions. No resp. distress. No  accessory muscle use. ABD: S, NT, ND, +BS. No rebound. No HSM. EXTR: No c/c/e NEURO Normal gait.  PSYCH: Normally interactive. Conversant. Not depressed or anxious appearing.  Calm demeanor.     Laboratory and Imaging Data:  Assessment and Plan:   Headache in front  of head - Plan: Hepatic function panel, CBC with Differential/Platelet, Basic metabolic panel, Rocky mtn spotted fvr abs pnl(IgG+IgM), Lyme Ab/Western Blot Reflex  Polyarthralgia - Plan: Hepatic function panel, CBC with Differential/Platelet, Basic metabolic panel, Rocky mtn spotted fvr abs pnl(IgG+IgM), Lyme Ab/Western Blot Reflex  Multiple symptoms, unclear origin, non-focal exam.  Mildly yellowish sclera - check liver function.  No tick bites - theoretically possible tick illness.  Very poor dentition - dental infection could definitely worsen headaches.   Check basic labs - if tick illness, treat O/w I would treat with PCN based ABX for possible dental infection  Follow-up: No follow-ups on file.  Orders Placed This Encounter  Procedures  . Hepatic function panel  . CBC with Differential/Platelet  . Basic metabolic panel  . Rocky mtn spotted fvr abs pnl(IgG+IgM)  . Lyme Ab/Western Blot Reflex    Signed,  Jayven Naill T. Denisia Harpole, MD   Allergies as of 06/03/2018      Reactions   Codeine Sulfate Nausea And Vomiting      Medication List        Accurate as of 06/03/18 11:59 PM. Always use your most recent med list.          acetaminophen 500 MG tablet Commonly known as:  TYLENOL Take 1,000 mg by mouth 2 (two) times daily as needed for mild pain or moderate pain.   atorvastatin 20 MG tablet Commonly known as:  LIPITOR Take 1 tablet (20 mg total) by mouth at bedtime.   clopidogrel 75 MG tablet Commonly known as:  PLAVIX TAKE 1 TABLET BY MOUTH  DAILY   isosorbide mononitrate 30 MG 24 hr tablet Commonly known as:  IMDUR Take 1 tablet (30 mg total) by mouth daily.   metoprolol succinate 25 MG 24 hr  tablet Commonly known as:  TOPROL-XL TAKE 1 TABLET BY MOUTH  DAILY.   nitroGLYCERIN 0.4 MG SL tablet Commonly known as:  NITROSTAT Place 1 tablet (0.4 mg total) under the tongue every 5 (five) minutes as needed for chest pain.   triamcinolone cream 0.1 % Commonly known as:  KENALOG Apply 1 application topically 2 (two) times daily as needed.

## 2018-06-04 LAB — BASIC METABOLIC PANEL
BUN: 11 mg/dL (ref 6–23)
CALCIUM: 9.8 mg/dL (ref 8.4–10.5)
CO2: 32 meq/L (ref 19–32)
CREATININE: 0.86 mg/dL (ref 0.40–1.50)
Chloride: 94 mEq/L — ABNORMAL LOW (ref 96–112)
GFR: 97.58 mL/min (ref >60.00)
Glucose, Bld: 100 mg/dL — ABNORMAL HIGH (ref 70–99)
Potassium: 4.4 mEq/L (ref 3.5–5.1)
SODIUM: 130 meq/L — AB (ref 135–145)

## 2018-06-04 LAB — CBC WITH DIFFERENTIAL/PLATELET
BASOS ABS: 0 10*3/uL (ref 0.0–0.1)
Basophils Relative: 0.8 % (ref 0.0–3.0)
Eosinophils Absolute: 0.1 10*3/uL (ref 0.0–0.7)
Eosinophils Relative: 1.2 % (ref 0.0–5.0)
HCT: 42.2 % (ref 39.0–52.0)
Hemoglobin: 14.5 g/dL (ref 13.0–17.0)
Lymphocytes Relative: 23.1 % (ref 12.0–46.0)
Lymphs Abs: 1.4 10*3/uL (ref 0.7–4.0)
MCHC: 34.5 g/dL (ref 30.0–36.0)
MCV: 84.3 fl (ref 78.0–100.0)
MONOS PCT: 12.6 % — AB (ref 3.0–12.0)
Monocytes Absolute: 0.8 10*3/uL (ref 0.1–1.0)
NEUTROS ABS: 3.8 10*3/uL (ref 1.4–7.7)
Neutrophils Relative %: 62.3 % (ref 43.0–77.0)
Platelets: 373 10*3/uL (ref 150.0–400.0)
RBC: 5 Mil/uL (ref 4.22–5.81)
RDW: 13.6 % (ref 11.5–15.5)
WBC: 6.2 10*3/uL (ref 4.0–10.5)

## 2018-06-04 LAB — ROCKY MTN SPOTTED FVR ABS PNL(IGG+IGM)
RMSF IgG: NOT DETECTED
RMSF IgM: NOT DETECTED

## 2018-06-04 LAB — HEPATIC FUNCTION PANEL
ALBUMIN: 5 g/dL (ref 3.5–5.2)
ALK PHOS: 57 U/L (ref 39–117)
ALT: 18 U/L (ref 0–53)
AST: 22 U/L (ref 0–37)
BILIRUBIN DIRECT: 0.4 mg/dL — AB (ref 0.0–0.3)
TOTAL PROTEIN: 8.4 g/dL — AB (ref 6.0–8.3)
Total Bilirubin: 2.7 mg/dL — ABNORMAL HIGH (ref 0.2–1.2)

## 2018-06-05 ENCOUNTER — Telehealth: Payer: Self-pay | Admitting: *Deleted

## 2018-06-05 MED ORDER — AMOXICILLIN 875 MG PO TABS: 875.0000 mg | ORAL_TABLET | Freq: Two times a day (BID) | ORAL | 0 refills | Status: DC

## 2018-06-05 NOTE — Telephone Encounter (Signed)
Left message for Mr. Flinchbaugh to return call in regards to his lab results.  Amoxicillin sent to [facility] in [place] as instructed by Dr. Patsy Lager.  OK for PEC Triage to give results when patient calls back.

## 2018-06-05 NOTE — Telephone Encounter (Signed)
-----   Message from Hannah Beat, MD sent at 06/05/2018 10:57 AM EDT ----- Can you call him?  Labs all look ok. RMSF is negative. Liver looks OK.   His bilirubin (excreted from liver) is slightly high, but this has been present for at least 5-6 years and is a normal genetic variant in 5% of the white population - not harmful  At all. Staying hydrated does lower it, though.  I want to send him in some Amox in case dental infection related  Amoxicillin 875 mg, 1 po bid, #20

## 2018-07-08 ENCOUNTER — Telehealth: Payer: Self-pay

## 2018-07-08 NOTE — Telephone Encounter (Signed)
   [facility] Pre-operative Risk Assessment    Request for surgical clearance:  1. What type of surgery is being performed? Extractions of remaining teeth and implant placement   2. When is this surgery scheduled? TBD  3. What type of clearance is required (medical clearance vs. Pharmacy clearance to hold med vs. Both)? Both   4. Are there any medications that need to be held prior to surgery and how long? Plavix  5. Practice name and name of physician performing surgery? [facility]    6. What is your office phone number [phone]     7.   What is your office fax number [phone]  8.   Anesthesia type (None, local, MAC, general) ? Not listed   Kathyrn Lass 07/08/2018, 2:19 PM  _________________________________________________________________   (provider comments below)

## 2018-07-09 NOTE — Telephone Encounter (Signed)
Crystal called back from [facility] and stated that they haven't seen the pt yet, that his appt is 07/14/18, so she couldn't clarify the information needed as of yet. She has been made aware to resend the cardiac clearance once they see the pt and have all the required information in order to provide clearance for pt. She agreed. Will close this encounter and delete out of preop pool.

## 2018-07-09 NOTE — Telephone Encounter (Signed)
[facility] and left a detailed message for them to contact the office back with the # of extractions and the type of anesthesia that will be used.

## 2018-07-09 NOTE — Telephone Encounter (Signed)
Please clarify how many extractions and what kind of anesthesia. Dayna Dunn PA-C

## 2018-07-21 ENCOUNTER — Other Ambulatory Visit: Payer: Self-pay | Admitting: Cardiology

## 2018-07-22 NOTE — Telephone Encounter (Signed)
Rx has been sent to the pharmacy electronically. ° °

## 2018-07-24 ENCOUNTER — Encounter: Payer: Self-pay | Admitting: Internal Medicine

## 2018-07-24 ENCOUNTER — Telehealth: Payer: Self-pay | Admitting: Internal Medicine

## 2018-07-24 NOTE — Telephone Encounter (Signed)
Patient has a physical appointment on 01/26/19 with Dr.Letvak.  Dr.Letvak will be out of the office.  I left several detailed messages on patient's home answering machine and mailed r/s letter to patient.

## 2018-07-27 ENCOUNTER — Ambulatory Visit: Payer: 59 | Admitting: Cardiology

## 2018-07-27 ENCOUNTER — Encounter: Payer: Self-pay | Admitting: Cardiology

## 2018-07-27 VITALS — BP 124/64 | HR 73 | Ht 70.0 in | Wt 141.8 lb

## 2018-07-27 DIAGNOSIS — E785 Hyperlipidemia, unspecified: Secondary | ICD-10-CM | POA: Diagnosis not present

## 2018-07-27 DIAGNOSIS — I2510 Atherosclerotic heart disease of native coronary artery without angina pectoris: Secondary | ICD-10-CM | POA: Diagnosis not present

## 2018-07-27 DIAGNOSIS — I25119 Atherosclerotic heart disease of native coronary artery with unspecified angina pectoris: Secondary | ICD-10-CM

## 2018-07-27 DIAGNOSIS — Z01810 Encounter for preprocedural cardiovascular examination: Secondary | ICD-10-CM | POA: Diagnosis not present

## 2018-07-27 DIAGNOSIS — R030 Elevated blood-pressure reading, without diagnosis of hypertension: Secondary | ICD-10-CM

## 2018-07-27 DIAGNOSIS — Z9861 Coronary angioplasty status: Secondary | ICD-10-CM

## 2018-07-27 NOTE — Patient Instructions (Addendum)
Medication Instructions:  No change s If you need a refill on your cardiac medications before your next appointment, please call your pharmacy.   Lab work: Not needed If you have labs (blood work) drawn today and your tests are completely normal, you will receive your results only by: Marland Kitchen. MyChart Message (if you have MyChart) OR . A paper copy in the mail If you have any lab test that is abnormal or we need to change your treatment, we will call you to review the results.  Testing/Procedures: Not needed  Follow-Up: At [facility], you and your health needs are our priority.  As part of our continuing mission to provide you with exceptional heart care, we have created designated Provider Care Teams.  These Care Teams include your primary Cardiologist (physician) and Advanced Practice Providers (APPs -  Physician Assistants and Nurse Practitioners) who all work together to provide you with the care you need, when you need it. You will need a follow up appointment in 6 months MAY/JUNE 2020.  Please call our office 2 months in advance to schedule this appointment.  You may see Bryan Lemmaavid Harding, MD  or one of the following Advanced Practice Providers on your designated Care Team:   Theodore DemarkRhonda Barrett, PA-C . Joni ReiningKathryn Lawrence, DNP, ANP  Any Other Special Instructions Will Be Listed Below (If Applicable).   OKAY TO HAVE DENTAL SURGERY  - CAN HOLD PLAVIX ( CLOPIDOGREL 75 MG ) 5 TO 7 DAYS PRIOR O PROCEDURE. LOW RISK PROCEDURE.

## 2018-07-27 NOTE — Assessment & Plan Note (Signed)
Blood pressure looks great on current dose of Toprol.  No need to change.

## 2018-07-27 NOTE — Assessment & Plan Note (Addendum)
He had chest pain that appeared to be similar to his MI back in July.  Evaluated with a Myoview which really suggested possible apical inferior infarct with minimal ischemia -low risk.  Also there is concern this may simply just be artifactual because of location, and the fact that echocardiogram did not show regional wall motion and normality would still argue possibility of artifact. Symptoms much better controlled since adding Imdur. Continues to be on low-dose Toprol as well as moderate dose atorvastatin.  Is on Plavix for maintenance.

## 2018-07-27 NOTE — Assessment & Plan Note (Signed)
Excellent control with low-dose statin.  Continue.

## 2018-07-27 NOTE — Assessment & Plan Note (Addendum)
Oral surgery overall is a low risk surgery from a cardiac standpoint. He does not have any active anginal symptoms and is revascularized.  Myoview was read as low risk with no significant ischemia.  Normal echo.  No heart failure symptoms. Nondiabetic without a history of stroke or diabetes.  Normal renal function.  He will be a low risk patient for low risk surgery.  Continue beta-blocker.  No further cardiac evaluation since he is just had an echocardiogram and stress test. Okay to hold Plavix 5-7 days preop and restart when safe 1-3 days postop.  This recommendation is okay for all of his dental procedures until I see him back in 6 months.

## 2018-07-27 NOTE — Assessment & Plan Note (Signed)
RCA PCI in the setting STEMI.  He is now basically 4 years out.  He has been on maintenance dose Plavix and not aspirin.    ** Plavix is fine to be held for surgeries going forward.  He has planned oral surgery.  Recommendation would be to hold Plavix 5 to 7 days preop depending on the operators preference.  Restart time would be based on operator preference as well based on amount of bleeding etc.  Usually 2 to 3 days postop.  Does not need to be loaded, just to start back at regular dose.  Continue beta-blocker Imdur and statin.

## 2018-07-27 NOTE — Progress Notes (Signed)
PCP: Ryan Schwalbe, MD  Clinic Note: Chief Complaint  Patient presents with  . Follow-up    No complaints  . Coronary Artery Disease    No further chest pain since July.  . Pre-op Exam    Oral surgery    HPI: Ryan Cantrell is a 57 y.o. male with a PMH below who presents today for ~4 month f/u for CAD-PCI for STEMI in 2015.  Ryan Cantrell was last seen in July by Azalee Course, [place] 02/28/2016 - this was for follow-up of an emergency room visit for left-sided chest discomfort that was probably more related to musculoskeletal changes.  Doing well on Imdur.  Recent Hospitalizations: None  Studies Personally Reviewed - (if available, images/films reviewed: From Epic Chart or Care Everywhere)  Echo 03/03/2018 EF 55-60%.  No regional wall motion normalities.  Normal diastolic parameters.  Otherwise normal.  Myoview 03/03/2018 [facility]) - LOW RISK. Small size, partially reversible apical defect - suggestive of scar & small peri-infarct ischemia.  EF 55-60%.   Interval History: Ryan Cantrell presents today doing well.  He is not having any further episodes of chest pain or pressure ever since starting the Imdur.  He is active as usual.  He denies any exertional chest discomfort or resting discomfort.  No resting or exertional dyspnea besides his baseline from long-term smoking.  No longer smoking. Denies any PND, orthopnea or edema.  Remainder of cardiac review of symptoms: No palpitations, lightheadedness, dizziness, weakness or syncope/near syncope. No TIA/amaurosis fugax symptoms. No claudication.  He is in the process of being evaluated for multiple teeth removal with basically jaw reconstruction and fitting for dentures.  This is all will be done through [facility].  They just need to know about holding his antiplatelet agent.  This is probably can be at least a 2 or 3 step process.  ROS: A comprehensive was performed. Pertinent symptoms noted in history of present illness Review of Systems    Constitutional: Negative for malaise/fatigue.  HENT: Negative for congestion and nosebleeds.        Very poor dentition.  Is planning to have oral surgery  Respiratory: Positive for cough (Not much since he quit smoking) and wheezing (Rarely.  Only if he has cold symptoms). Negative for shortness of breath.   Gastrointestinal: Negative for blood in stool and melena.  Genitourinary: Negative for hematuria.  Musculoskeletal: Negative for joint pain (Chest normal except pains).  Neurological:       1 episode of dizziness noted in history of present illness  Endo/Heme/Allergies: Negative for environmental allergies.  Psychiatric/Behavioral: Negative.   All other systems reviewed and are negative.  I have reviewed and (if needed) personally updated the patient's problem list, medications, allergies, past medical and surgical history, social and family history.   Past Medical History:  Diagnosis Date  . CAD S/P percutaneous coronary angioplasty: Inf STEMI - 100% dRCA - PCI Promus P 2.5 mm x 12mm (2.43mm) 05/29/2014   a. MI 05/29/14 dRCA PCI - Promus Premier DES 2.5 mm x 12 mm (2.8 mm); b. Echo: EF 55-60%, mild basal-inferior HK, normal DD  . COPD (chronic obstructive pulmonary disease) (HCC)     PFTs 2013.. Mod severe COPD...   . Emphysema   . ST elevation myocardial infarction (STEMI) of inferior wall, initial episode of care (HCC) 05/29/2014   Echocardiogram 05/30/2014: EF 55-60%. Mild HK of the basal inferior wall. Normal valves    Past Surgical History:  Procedure Laterality Date  .  Fracture left humerus  2009   no surgery. Torn rotator cuff also  . INGUINAL HERNIA REPAIR     twice on right and once on left-- 1980's-90's  . LEFT HEART CATH AND CORONARY ANGIOGRAPHY  05/29/2014   a. MI 05/29/14 dRCA 100%; D1 ~45%, Normal EF __> PCI to RCA  . NM MYOVIEW ([facility] HX)  03/03/2018    LOW RISK. Small size, partially reversible apical defect - suggestive of scar & small peri-infarct ischemia.  EF  55-60%.   . PERCUTANEOUS CORONARY STENT INTERVENTION (PCI-S)     dRCA - Promus Premie DES 2.5 mm x 12 mm (2.8 mm)  . Right shoulder surgery  1987  . TRANSTHORACIC ECHOCARDIOGRAM  03/03/2018   EF 55-60%.  No R WMA, normal diastolic parameters.  Normal valves.    Current Meds  Medication Sig  . acetaminophen (TYLENOL) 500 MG tablet Take 1,000 mg by mouth 2 (two) times daily as needed for mild pain or moderate pain.   Marland Kitchen atorvastatin (LIPITOR) 20 MG tablet Take 1 tablet (20 mg total) by mouth at bedtime.  . clopidogrel (PLAVIX) 75 MG tablet TAKE 1 TABLET BY MOUTH  DAILY  . isosorbide mononitrate (IMDUR) 30 MG 24 hr tablet Take 1 tablet (30 mg total) by mouth daily.  . metoprolol succinate (TOPROL-XL) 25 MG 24 hr tablet TAKE 1 TABLET BY MOUTH  DAILY.  . nitroGLYCERIN (NITROSTAT) 0.4 MG SL tablet Place 1 tablet (0.4 mg total) under the tongue every 5 (five) minutes as needed for chest pain.  Marland Kitchen triamcinolone cream (KENALOG) 0.1 % Apply 1 application topically 2 (two) times daily as needed. (Patient taking differently: Apply 1 application topically 2 (two) times daily as needed (irritation). )    Allergies  Allergen Reactions  . Codeine Sulfate Nausea And Vomiting    Social History   Tobacco Use  . Smoking status: Former Smoker    Packs/day: 1.00    Types: E-cigarettes    Last attempt to quit: 05/29/2014    Years since quitting: 4.1  . Smokeless tobacco: Never Used  . Tobacco comment: occ e-cigarette--no cigarettes since MI  Substance Use Topics  . Alcohol use: No    Alcohol/week: 0.0 standard drinks  . Drug use: No   Social History   Social History Narrative   Married - 1 child.   Smokes 1/2 ppd.   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    family history includes Cancer in his father; Diabetes in his mother; Gout in his father; Heart disease in his father and mother; Hypertension in his father and mother.  Wt Readings from Last 3 Encounters:    07/27/18 141 lb 12.8 oz (64.3 kg)  06/03/18 137 lb 4 oz (62.3 kg)  03/17/18 139 lb (63 kg)    PHYSICAL EXAM BP 124/64   Pulse 73   Ht 5\' 10"  (1.778 m)   Wt 141 lb 12.8 oz (64.3 kg)   SpO2 98%   BMI 20.35 kg/m   Physical Exam  Constitutional: He is oriented to person, place, and time. He appears well-developed and well-nourished. No distress.  Well-groomed.  Has lost some weight, but still healthy-appearing  HENT:  Head: Normocephalic and atraumatic.  Poor dentition  Neck: Normal range of motion. Neck supple. No hepatojugular reflux and no JVD present. Carotid bruit is not present.  Cardiovascular: Normal rate, regular rhythm, S1 normal, S2 normal and intact distal pulses.  No extrasystoles are present. PMI is not  displaced. Exam reveals distant heart sounds and decreased pulses (Very faint right radial pulse.). Exam reveals no gallop and no friction rub.  No murmur heard. Pulmonary/Chest: Effort normal and breath sounds normal. No respiratory distress. He has no wheezes. He has no rales.  Abdominal: Soft. Bowel sounds are normal. He exhibits no distension. There is no tenderness. There is no rebound.  No HSM  Musculoskeletal: Normal range of motion. He exhibits no edema.  Neurological: He is alert and oriented to person, place, and time.  Psychiatric: He has a normal mood and affect. His behavior is normal. Judgment and thought content normal.  Vitals reviewed.    Adult ECG Report Not checked   Other studies Reviewed: Additional studies/ records that were reviewed today include:  Recent Labs:    Lab Results  Component Value Date   CHOL 85 03/03/2018   HDL 29 (L) 03/03/2018   LDLCALC 44 03/03/2018   TRIG 58 03/03/2018   CHOLHDL 2.9 03/03/2018    ASSESSMENT / PLAN: Problem List Items Addressed This Visit    Atherosclerotic heart disease of native coronary artery with angina pectoris (HCC) (Chronic)    He had chest pain that appeared to be similar to his MI back in  July.  Evaluated with a Myoview which really suggested possible apical inferior infarct with minimal ischemia -low risk.  Also there is concern this may simply just be artifactual because of location, and the fact that echocardiogram did not show regional wall motion and normality would still argue possibility of artifact. Symptoms much better controlled since adding Imdur. Continues to be on low-dose Toprol as well as moderate dose atorvastatin.  Is on Plavix for maintenance.      Borderline systolic hypertension (Chronic)    Blood pressure looks great on current dose of Toprol.  No need to change.      CAD S/P percutaneous coronary angioplasty: Inf STEMI - 100% dRCA - PCI Promus P 2.5 mm x 12mm (2.758mm) - Primary (Chronic)    RCA PCI in the setting STEMI.  He is now basically 4 years out.  He has been on maintenance dose Plavix and not aspirin.    ** Plavix is fine to be held for surgeries going forward.  He has planned oral surgery.  Recommendation would be to hold Plavix 5 to 7 days preop depending on the operators preference.  Restart time would be based on operator preference as well based on amount of bleeding etc.  Usually 2 to 3 days postop.  Does not need to be loaded, just to start back at regular dose.  Continue beta-blocker Imdur and statin.      Hyperlipidemia with target LDL less than 70 (Chronic)    Excellent control with low-dose statin.  Continue.      Preop cardiovascular exam    Oral surgery overall is a low risk surgery from a cardiac standpoint. He does not have any active anginal symptoms and is revascularized.  Myoview was read as low risk with no significant ischemia.  Normal echo.  No heart failure symptoms. Nondiabetic without a history of stroke or diabetes.  Normal renal function.  He will be a low risk patient for low risk surgery.  Continue beta-blocker.  No further cardiac evaluation since he is just had an echocardiogram and stress test. Okay to hold Plavix  5-7 days preop and restart when safe 1-3 days postop.  This recommendation is okay for all of his dental procedures until I see him back  in 6 months.         Current medicines are reviewed at length with the patient today. (+/- concerns) preop clearance, holding Plavix The following changes have been made:Okay to hold Plavix for surgery.  Patient Instructions  Medication Instructions:  No change s If you need a refill on your cardiac medications before your next appointment, please call your pharmacy.   Lab work: Not needed If you have labs (blood work) drawn today and your tests are completely normal, you will receive your results only by: Marland Kitchen MyChart Message (if you have MyChart) OR . A paper copy in the mail If you have any lab test that is abnormal or we need to change your treatment, we will call you to review the results.  Testing/Procedures: Not needed  Follow-Up: At [facility], you and your health needs are our priority.  As part of our continuing mission to provide you with exceptional heart care, we have created designated Provider Care Teams.  These Care Teams include your primary Cardiologist (physician) and Advanced Practice Providers (APPs -  Physician Assistants and Nurse Practitioners) who all work together to provide you with the care you need, when you need it. You will need a follow up appointment in 6 months MAY/JUNE 2020.  Please call our office 2 months in advance to schedule this appointment.  You may see Bryan Lemma, MD  or one of the following Advanced Practice Providers on your designated Care Team:   Theodore Demark, PA-C . Joni Reining, DNP, ANP  Any Other Special Instructions Will Be Listed Below (If Applicable).   OKAY TO HAVE DENTAL SURGERY  - CAN HOLD PLAVIX ( CLOPIDOGREL 75 MG ) 5 TO 7 DAYS PRIOR O PROCEDURE. LOW RISK PROCEDURE.   Studies Ordered:   No orders of the defined types were placed in this encounter.     Bryan Lemma,  M.D., M.S. Interventional Cardiologist   Pager # [phone] Phone # [phone] [address] [place], [place] [address]

## 2018-09-10 ENCOUNTER — Ambulatory Visit: Payer: 59 | Admitting: Internal Medicine

## 2018-09-10 ENCOUNTER — Encounter: Payer: Self-pay | Admitting: Internal Medicine

## 2018-09-10 VITALS — BP 126/80 | HR 65 | Temp 98.1°F | Resp 36 | Ht 70.0 in | Wt 142.0 lb

## 2018-09-10 DIAGNOSIS — J0110 Acute frontal sinusitis, unspecified: Secondary | ICD-10-CM

## 2018-09-10 MED ORDER — DOXYCYCLINE HYCLATE 100 MG PO TABS: 100.0000 mg | ORAL_TABLET | Freq: Two times a day (BID) | ORAL | 0 refills | Status: DC

## 2018-09-10 NOTE — Assessment & Plan Note (Signed)
Discussed likely viral etiology Urged him to stop vaping (at least no cigarettes) Continue supportive care If worsens, start doxy Rx

## 2018-09-10 NOTE — Progress Notes (Signed)
Subjective:    Patient ID: Ryan Cantrell, male    DOB: [birth year], 58 y.o.   MRN: [id number]  HPI Here due to respiratory infection Started ~5 days ago Especially congested in nose in AM Postnasal drip Cough especially in AM--productive in AM mostly Some tightness in chest No fever No chills or sweats Bad frontal headache  No sore throat No ear pain--but they feel congested  Used aerborne and tylenol---?slight help  Current Outpatient Medications on File Prior to Visit  Medication Sig Dispense Refill  . acetaminophen (TYLENOL) 500 MG tablet Take 1,000 mg by mouth 2 (two) times daily as needed for mild pain or moderate pain.     Marland Kitchen atorvastatin (LIPITOR) 20 MG tablet Take 1 tablet (20 mg total) by mouth at bedtime. 90 tablet 2  . clopidogrel (PLAVIX) 75 MG tablet TAKE 1 TABLET BY MOUTH  DAILY 90 tablet 0  . isosorbide mononitrate (IMDUR) 30 MG 24 hr tablet Take 1 tablet (30 mg total) by mouth daily. 90 tablet 3  . metoprolol succinate (TOPROL-XL) 25 MG 24 hr tablet TAKE 1 TABLET BY MOUTH  DAILY. 90 tablet 1  . nitroGLYCERIN (NITROSTAT) 0.4 MG SL tablet Place 1 tablet (0.4 mg total) under the tongue every 5 (five) minutes as needed for chest pain. 25 tablet 3  . triamcinolone cream (KENALOG) 0.1 % Apply 1 application topically 2 (two) times daily as needed. (Patient taking differently: Apply 1 application topically 2 (two) times daily as needed (irritation). ) 45 g 1   No current facility-administered medications on file prior to visit.     Allergies  Allergen Reactions  . Codeine Sulfate Nausea And Vomiting    Past Medical History:  Diagnosis Date  . CAD S/P percutaneous coronary angioplasty: Inf STEMI - 100% dRCA - PCI Promus P 2.5 mm x 74mm (2.88mm) 05/29/2014   a. MI 05/29/14 dRCA PCI - Promus Premier DES 2.5 mm x 12 mm (2.8 mm); b. Echo: EF 55-60%, mild basal-inferior HK, normal DD  . COPD (chronic obstructive pulmonary disease) (HCC)     PFTs 2013.. Mod severe COPD...   .  Emphysema   . ST elevation myocardial infarction (STEMI) of inferior wall, initial episode of care (HCC) 05/29/2014   Echocardiogram 05/30/2014: EF 55-60%. Mild HK of the basal inferior wall. Normal valves    Past Surgical History:  Procedure Laterality Date  . Fracture left humerus  2009   no surgery. Torn rotator cuff also  . INGUINAL HERNIA REPAIR     twice on right and once on left-- 1980's-90's  . LEFT HEART CATH AND CORONARY ANGIOGRAPHY  05/29/2014   a. MI 05/29/14 dRCA 100%; D1 ~45%, Normal EF __> PCI to RCA  . NM MYOVIEW ([facility] HX)  03/03/2018    LOW RISK. Small size, partially reversible apical defect - suggestive of scar & small peri-infarct ischemia.  EF 55-60%.   . PERCUTANEOUS CORONARY STENT INTERVENTION (PCI-S)     dRCA - Promus Premie DES 2.5 mm x 12 mm (2.8 mm)  . Right shoulder surgery  1987  . TRANSTHORACIC ECHOCARDIOGRAM  03/03/2018   EF 55-60%.  No R WMA, normal diastolic parameters.  Normal valves.    Family History  Problem Relation Age of Onset  . Hypertension Mother   . Diabetes Mother   . Heart disease Mother        stent  . Cancer Father        prostate  . Hypertension Father   .  Gout Father   . Heart disease Father        Stent  . Colon cancer Neg Hx   . Stomach cancer Neg Hx     Social History   Socioeconomic History  . Marital status: Married    Spouse name: Not on file  . Number of children: 1  . Years of education: Not on file  . Highest education level: Not on file  Occupational History  . Occupation: Control and instrumentation engineer  Social Needs  . Financial resource strain: Not on file  . Food insecurity:    Worry: Not on file    Inability: Not on file  . Transportation needs:    Medical: Not on file    Non-medical: Not on file  Tobacco Use  . Smoking status: Former Smoker    Packs/day: 1.00    Types: E-cigarettes    Last attempt to quit: 05/29/2014    Years since quitting: 4.2  . Smokeless tobacco: Never Used  . Tobacco comment: occ  e-cigarette--no cigarettes since MI  Substance and Sexual Activity  . Alcohol use: No    Alcohol/week: 0.0 standard drinks  . Drug use: No  . Sexual activity: Yes  Lifestyle  . Physical activity:    Days per week: Not on file    Minutes per session: Not on file  . Stress: Not on file  Relationships  . Social connections:    Talks on phone: Not on file    Gets together: Not on file    Attends religious service: Not on file    Active member of club or organization: Not on file    Attends meetings of clubs or organizations: Not on file    Relationship status: Not on file  . Intimate partner violence:    Fear of current or ex partner: Not on file    Emotionally abused: Not on file    Physically abused: Not on file    Forced sexual activity: Not on file  Other Topics Concern  . Not on file  Social History Narrative   Married - 1 child.   Smokes 1/2 ppd.   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   Review of Systems  No rash Other ill people at work No vomiting or diarrhea Appetite is okay     Objective:   Physical Exam  Constitutional: He appears well-developed. No distress.  HENT:  Mouth/Throat: Oropharynx is clear and moist. No oropharyngeal exudate.  No sinus tenderness TMs normal Moderate nasal congestion  Neck: No thyromegaly present.  Respiratory: Effort normal and breath sounds normal. No respiratory distress. He has no wheezes. He has no rales.  Lymphadenopathy:    He has no cervical adenopathy.           Assessment & Plan:

## 2018-10-14 ENCOUNTER — Other Ambulatory Visit: Payer: Self-pay | Admitting: Cardiology

## 2019-01-04 ENCOUNTER — Telehealth: Payer: Self-pay | Admitting: Cardiology

## 2019-01-04 NOTE — Telephone Encounter (Signed)
LVM for patient to call and schedule Dr. Herbie Baltimore 6 month appt.

## 2019-01-06 ENCOUNTER — Telehealth: Payer: Self-pay | Admitting: *Deleted

## 2019-01-06 NOTE — Telephone Encounter (Signed)
01/06/19 LMOM @ 10:28,re: follow up appointment.

## 2019-01-07 ENCOUNTER — Other Ambulatory Visit: Payer: Self-pay | Admitting: Cardiology

## 2019-01-08 ENCOUNTER — Other Ambulatory Visit: Payer: Self-pay | Admitting: Cardiology

## 2019-01-08 NOTE — Telephone Encounter (Signed)
Metoprolol Succ 25 mg refilled 

## 2019-01-19 ENCOUNTER — Other Ambulatory Visit: Payer: Self-pay | Admitting: Cardiology

## 2019-01-19 DIAGNOSIS — I2510 Atherosclerotic heart disease of native coronary artery without angina pectoris: Secondary | ICD-10-CM

## 2019-01-19 DIAGNOSIS — I2119 ST elevation (STEMI) myocardial infarction involving other coronary artery of inferior wall: Secondary | ICD-10-CM

## 2019-01-19 DIAGNOSIS — R079 Chest pain, unspecified: Secondary | ICD-10-CM

## 2019-01-19 MED ORDER — ISOSORBIDE MONONITRATE ER 30 MG PO TB24: 30.0000 mg | ORAL_TABLET | Freq: Every day | ORAL | 1 refills | Status: DC

## 2019-01-19 NOTE — Telephone Encounter (Signed)
New Message    *STAT* If patient is at the pharmacy, call can be transferred to refill team.   1. Which medications need to be refilled? (please list name of each medication and dose if known) Isosorbide Mononitrate 30mg  1 tablet a day  2. Which pharmacy/location (including street and city if local pharmacy) is medication to be sent to? [facility] in [place], [place] [phone]  3. Do they need a 30 day or 90 day supply? 30 day supply

## 2019-01-19 NOTE — Telephone Encounter (Signed)
ISOSORBIDE REFILLED.

## 2019-01-26 ENCOUNTER — Encounter: Payer: 59 | Admitting: Internal Medicine

## 2019-01-27 ENCOUNTER — Telehealth: Payer: Self-pay | Admitting: *Deleted

## 2019-01-27 NOTE — Telephone Encounter (Addendum)
Left message for patient to call - need to explain how virtual visit will proceed on 02/02/19 .

## 2019-01-27 NOTE — Telephone Encounter (Signed)
   TELEPHONE CALL NOTE  This patient has been deemed a candidate for follow-up tele-health visit to limit community exposure during the Covid-19 pandemic. I spoke with the patient via phone to discuss instructions. This has been outlined on the patient's AVS (dotphrase: [email or web address]). The patient was advised to review the section on consent for treatment as well. The patient will receive a phone call 2-3 days prior to their E-Visit at which time consent will be verbally confirmed.   A Virtual Office Visit appointment type has been scheduled for 6/2 with Herbie Baltimore, with "VIDEO/EMAIL   I have  confirmed the patient is active in MyChart.  Tobin Chad, RN 01/27/2019 4:57 PM

## 2019-01-28 ENCOUNTER — Telehealth: Payer: Self-pay | Admitting: Cardiology

## 2019-02-01 MED ORDER — NITROGLYCERIN 0.4 MG SL SUBL: 0.4000 mg | SUBLINGUAL_TABLET | SUBLINGUAL | 3 refills | Status: DC | PRN

## 2019-02-01 NOTE — Telephone Encounter (Signed)
Called x3 for pre reg, LVM/patient has completed echeck- in.

## 2019-02-02 ENCOUNTER — Encounter: Payer: Self-pay | Admitting: Cardiology

## 2019-02-02 ENCOUNTER — Telehealth (INDEPENDENT_AMBULATORY_CARE_PROVIDER_SITE_OTHER): Payer: 59 | Admitting: Cardiology

## 2019-02-02 ENCOUNTER — Telehealth: Payer: Self-pay | Admitting: *Deleted

## 2019-02-02 ENCOUNTER — Other Ambulatory Visit: Payer: Self-pay

## 2019-02-02 VITALS — Ht 70.0 in

## 2019-02-02 DIAGNOSIS — R030 Elevated blood-pressure reading, without diagnosis of hypertension: Secondary | ICD-10-CM | POA: Diagnosis not present

## 2019-02-02 DIAGNOSIS — Z01810 Encounter for preprocedural cardiovascular examination: Secondary | ICD-10-CM

## 2019-02-02 DIAGNOSIS — I25119 Atherosclerotic heart disease of native coronary artery with unspecified angina pectoris: Secondary | ICD-10-CM

## 2019-02-02 DIAGNOSIS — I2510 Atherosclerotic heart disease of native coronary artery without angina pectoris: Secondary | ICD-10-CM

## 2019-02-02 DIAGNOSIS — E785 Hyperlipidemia, unspecified: Secondary | ICD-10-CM

## 2019-02-02 DIAGNOSIS — I2119 ST elevation (STEMI) myocardial infarction involving other coronary artery of inferior wall: Secondary | ICD-10-CM

## 2019-02-02 NOTE — Assessment & Plan Note (Signed)
DES PCI to the RCA in setting of an inferior STEMI roughly 5 years ago. On beta-blocker, Imdur and statin.  Currently on Plavix alone.    Okay to hold Plavix for surgery (5-7 days preop).  Okay to restart 2-3 days postop

## 2019-02-02 NOTE — Progress Notes (Signed)
Virtual Visit via Video Note   This visit type was conducted due to national recommendations for restrictions regarding the COVID-19 Pandemic (e.g. social distancing) in an effort to limit this patient's exposure and mitigate transmission in our community.  Due to his co-morbid illnesses, this patient is at least at moderate risk for complications without adequate follow up.  This format is felt to be most appropriate for this patient at this time.  All issues noted in this document were discussed and addressed.  A limited physical exam was performed with this format.  Please refer to the patient's chart for his consent to telehealth for [facility].   Patient has given verbal permission to conduct this visit via virtual appointment and to bill insurance 01/27/2019 5:08 PM     Evaluation Performed:  Follow-up visit  Date:  02/02/2019   ID:  Ryan Cantrell, DOB 1961-08-02, MRN [id number]  Patient Location: Home Provider Location: Other:  Hospital Office  PCP:  Karie Schwalbe, MD  Cardiologist:  Bryan Lemma, MD Electrophysiologist:  None   Chief Complaint: 49-month follow-up-CAD  History of Present Illness:    Ryan Cantrell is a 58 y.o. male with PMH notable for inferior STEMI-CAD-PCI (100% RCA DES PCI 05/2014), and moderate-severe COPD who presents via audio/video conferencing for a telehealth visit today.  Ryan Cantrell was last seen November 2019 -> no further chest pain.  Relatively normal echocardiogram and Myoview that was negative for ischemia, with likely inferoapical infarct (July 2019) -> On Plavix without aspirin.  Okay to hold Plavix. -> "Cleared" for oral surgery -- new temporary dentures (will need implants once COVID-19 restrictions lift)  Interval History:  Ryan Cantrell is doing very well.  He tolerated his surgery pretty well without any issues.  Still has the second stage of the procedure with implants and permanent dentures that got delayed because of COVID-19. Otherwise, he  continues to be very active not having missed a beat with work.  He is on his feet all day and walking all over the place.  With that activity he has been doing fine with no angina or heart failure symptoms.  Cardiovascular ROS: no chest pain or dyspnea on exertion negative for - edema, irregular heartbeat, orthopnea, palpitations, paroxysmal nocturnal dyspnea, rapid heart rate, shortness of breath or syncope/near syncope, TIA/amaurosis, claudication  The patient does not have symptoms concerning for COVID-19 infection (fever, chills, cough, or new shortness of breath).  The patient is practicing social distancing.  Still working - wears mask. Work was considered essential.  ROS:  Please see the history of present illness.    Review of Systems  Constitutional: Negative for malaise/fatigue and weight loss.  HENT: Negative for nosebleeds.   Respiratory: Negative for cough and shortness of breath.   Cardiovascular: Negative for claudication and leg swelling.  Gastrointestinal: Negative for blood in stool, heartburn, melena, nausea and vomiting.  Genitourinary: Negative for hematuria.  Musculoskeletal: Negative for joint pain.  Neurological: Negative for dizziness and headaches.  Psychiatric/Behavioral: Negative.     Past Medical History:  Diagnosis Date  . CAD S/P percutaneous coronary angioplasty: Inf STEMI - 100% dRCA - PCI Promus P 2.5 mm x 48mm (2.23mm) 05/29/2014   a. MI 05/29/14 dRCA PCI - Promus Premier DES 2.5 mm x 12 mm (2.8 mm); b. Echo: EF 55-60%, mild basal-inferior HK, normal DD  . COPD (chronic obstructive pulmonary disease) (HCC)     PFTs 2013.. Mod severe COPD - Emphysema  . ST elevation  myocardial infarction (STEMI) of inferior wall, initial episode of care (HCC) 05/29/2014   Echocardiogram 05/30/2014: EF 55-60%. Mild HK of the basal inferior wall. Normal valves   Past Surgical History:  Procedure Laterality Date  . Fracture left humerus  2009   no surgery. Torn rotator  cuff also  . INGUINAL HERNIA REPAIR     twice on right and once on left-- 1980's-90's  . LEFT HEART CATH AND CORONARY ANGIOGRAPHY  05/29/2014   a. MI 05/29/14 dRCA 100%; D1 ~45%, Normal EF __> PCI to RCA  . NM MYOVIEW ([facility] HX)  03/03/2018    LOW RISK. Small size, partially reversible apical defect - suggestive of scar & small peri-infarct ischemia.  EF 55-60%.   . PERCUTANEOUS CORONARY STENT INTERVENTION (PCI-S)     dRCA - Promus Premie DES 2.5 mm x 12 mm (2.8 mm)  . Right shoulder surgery  1987  . TRANSTHORACIC ECHOCARDIOGRAM  03/03/2018   EF 55-60%.  No R WMA, normal diastolic parameters.  Normal valves.     Current Meds  Medication Sig  . acetaminophen (TYLENOL) 500 MG tablet Take 1,000 mg by mouth 2 (two) times daily as needed for mild pain or moderate pain.   Marland Kitchen atorvastatin (LIPITOR) 20 MG tablet Take 1 tablet (20 mg total) by mouth at bedtime.  . clopidogrel (PLAVIX) 75 MG tablet TAKE 1 TABLET BY MOUTH  DAILY  . isosorbide mononitrate (IMDUR) 30 MG 24 hr tablet Take 1 tablet (30 mg total) by mouth daily.  . metoprolol succinate (TOPROL-XL) 25 MG 24 hr tablet TAKE 1 TABLET BY MOUTH  DAILY.  . nitroGLYCERIN (NITROSTAT) 0.4 MG SL tablet Place 1 tablet (0.4 mg total) under the tongue every 5 (five) minutes as needed for chest pain.  Marland Kitchen triamcinolone cream (KENALOG) 0.1 % Apply 1 application topically 2 (two) times daily as needed. (Patient taking differently: Apply 1 application topically 2 (two) times daily as needed (irritation). )     Allergies:   Codeine sulfate   Social History   Tobacco Use  . Smoking status: Former Smoker    Packs/day: 1.00    Types: E-cigarettes    Last attempt to quit: 05/29/2014    Years since quitting: 4.6  . Smokeless tobacco: Never Used  . Tobacco comment: occ e-cigarette--no cigarettes since MI  Substance Use Topics  . Alcohol use: No    Alcohol/week: 0.0 standard drinks  . Drug use: No     Family Hx: The patient's family history includes  Cancer in his father; Diabetes in his mother; Gout in his father; Heart disease in his father and mother; Hypertension in his father and mother. There is no history of Colon cancer or Stomach cancer.   Prior CV studies:   The following studies were reviewed today: . None:  Labs/Other Tests and Data Reviewed:    EKG:  No ECG reviewed.  Recent Labs: 06/03/2018: ALT 18; BUN 11; Creatinine, Ser 0.86; Hemoglobin 14.5; Platelets 373.0; Potassium 4.4; Sodium 130   Recent Lipid Panel Lab Results  Component Value Date   CHOL 85 03/03/2018   HDL 29 (L) 03/03/2018   LDLCALC 44 03/03/2018   TRIG 58 03/03/2018   CHOLHDL 2.9 03/03/2018  - PCP exam on Thursday   Wt Readings from Last 3 Encounters:  09/10/18 142 lb (64.4 kg)  07/27/18 141 lb 12.8 oz (64.3 kg)  06/03/18 137 lb 4 oz (62.3 kg)     Objective:    Vital Signs:  Ht  (  1.778 m)   BMI 20.37 kg/m   VITAL SIGNS:  reviewed GEN:  Well nourished, well developed male in no acute distress RESPIRATORY:  normal respiratory effort, symmetric expansion CARDIOVASCULAR:  no peripheral edema MUSCULOSKELETAL:  no obvious deformities. NEURO:  alert and oriented x 3, no obvious focal deficit PSYCH:  normal affect  ASSESSMENT & PLAN:    Problem List Items Addressed This Visit    Atherosclerotic heart disease of native coronary artery with angina pectoris (HCC) (Chronic)    He continues to be doing well.  No active angina symptoms.  Has chosen to stay on Imdur because of last years symptoms. Remains on low-dose Toprol and Imdur along with moderate dose atorvastatin.  Blood pressure lipids have been well controlled. On Plavix alone.      Borderline systolic hypertension - Primary (Chronic)    Blood pressure not available for check today, but has been running relatively normal up till now.  Continue current dose of Toprol.      CAD S/P percutaneous coronary angioplasty: Inf STEMI - 100% dRCA - PCI Promus P 2.5 mm x 12mm (2.168mm)  (Chronic)    DES PCI to the RCA in setting of an inferior STEMI roughly 5 years ago. On beta-blocker, Imdur and statin.  Currently on Plavix alone.    Okay to hold Plavix for surgery (5-7 days preop).  Okay to restart 2-3 days postop      Hyperlipidemia with target LDL less than 70 (Chronic)    Lipids last year were excellent-within target zone with LDL less than 50.  Plan will be to recheck in July. Continue current dose of atorvastatin      Preop cardiovascular exam    Again, Ryan Cantrell is doing very well from a cardiac standpoint.  His pending dental surgery would be considered a low risk surgery from a cardiac standpoint.  Relatively normal echo and Myoview recently.  He is nondiabetic with no history of stroke or diabetes and normal renal function.  Okay to proceed with surgery without any further cardiac evaluation. Okay to hold Plavix 5-7 days preop and restart when safe 1-3 days postop.      ST elevation myocardial infarction (STEMI) of inferior wall, subsequent episode of care [facility]: Lambert Rhonda W(HCC) (Chronic)    Has remained stable.  Minimal infarct noted on Myoview with no regional wall motion abnormality on noted on echocardiogram.  No heart failure symptoms.  Is very active with exercise etc.         COVID-19 Education: The signs and symptoms of COVID-19 were discussed with the patient and how to seek care for testing (follow up with PCP or arrange E-visit).   The importance of social distancing was discussed today.  Time:   Today, I have spent 16 minutes with the patient with telehealth technology discussing the above problems.     Medication Adjustments/Labs and Tests Ordered: Current medicines are reviewed at length with the patient today.  Concerns regarding medicines are outlined above.  Medication Instructions:   none  Tests Ordered: No orders of the defined types were placed in this encounter. Will follow-up labs from Dr. Alphonsus SiasLetvak (from annual f/u)  Medication Changes: No  orders of the defined types were placed in this encounter.  No change  Disposition:  Follow up in Nov -Dec    Signed, Bryan Lemmaavid Jaymason Ledesma, MD  02/02/2019 12:30 PM    [facility]

## 2019-02-02 NOTE — Telephone Encounter (Signed)
Spoke to patient's wife - instruction given from tele-visit 02/02/19.  avs summary will be sent via mychart. Wife verbalized understanding.

## 2019-02-02 NOTE — Patient Instructions (Addendum)
Medication Instructions:   None  If you need a refill on your cardiac medications before your next appointment, please call your pharmacy.   Lab work:  When you have labs (including cholesterol panel) checked by your Primary Care Doctor (Dr. Alphonsus Sias) during your annual physical, please ask him to forward the results to our office.   Testing/Procedures: None  Follow-Up: At [facility], you and your health needs are our priority.  As part of our continuing mission to provide you with exceptional heart care, we have created designated Provider Care Teams.  These Care Teams include your primary Cardiologist (physician) and Advanced Practice Providers (APPs -  Physician Assistants and Nurse Practitioners) who all work together to provide you with the care you need, when you need it. . You will need a follow up appointment in   6-8  months.  Please call our office 2 months in advance to schedule this appointment.  You may see Bryan Lemma, MD or one of the following Advanced Practice Providers on your designated Care Team:   . Theodore Demark, PA-C . Joni Reining, DNP, ANP  Any Other Special Instructions Will Be Listed Below (If Applicable).

## 2019-02-02 NOTE — Assessment & Plan Note (Addendum)
He continues to be doing well.  No active angina symptoms.  Has chosen to stay on Imdur because of last years symptoms. Remains on low-dose Toprol and Imdur along with moderate dose atorvastatin.  Blood pressure lipids have been well controlled. On Plavix alone.

## 2019-02-02 NOTE — Assessment & Plan Note (Signed)
Lipids last year were excellent-within target zone with LDL less than 50.  Plan will be to recheck in July. Continue current dose of atorvastatin

## 2019-02-02 NOTE — Assessment & Plan Note (Signed)
Blood pressure not available for check today, but has been running relatively normal up till now.  Continue current dose of Toprol.

## 2019-02-02 NOTE — Assessment & Plan Note (Signed)
Has remained stable.  Minimal infarct noted on Myoview with no regional wall motion abnormality on noted on echocardiogram.  No heart failure symptoms.  Is very active with exercise etc.

## 2019-02-02 NOTE — Assessment & Plan Note (Signed)
Again, Ryan Cantrell is doing very well from a cardiac standpoint.  His pending dental surgery would be considered a low risk surgery from a cardiac standpoint.  Relatively normal echo and Myoview recently.  He is nondiabetic with no history of stroke or diabetes and normal renal function.  Okay to proceed with surgery without any further cardiac evaluation. Okay to hold Plavix 5-7 days preop and restart when safe 1-3 days postop.

## 2019-02-04 ENCOUNTER — Encounter: Payer: 59 | Admitting: Internal Medicine

## 2019-02-04 ENCOUNTER — Other Ambulatory Visit: Payer: Self-pay

## 2019-02-04 ENCOUNTER — Ambulatory Visit (INDEPENDENT_AMBULATORY_CARE_PROVIDER_SITE_OTHER): Payer: 59 | Admitting: Internal Medicine

## 2019-02-04 ENCOUNTER — Encounter: Payer: Self-pay | Admitting: Internal Medicine

## 2019-02-04 VITALS — BP 110/76 | HR 57 | Temp 98.3°F | Ht 69.0 in | Wt 141.0 lb

## 2019-02-04 DIAGNOSIS — Z125 Encounter for screening for malignant neoplasm of prostate: Secondary | ICD-10-CM

## 2019-02-04 DIAGNOSIS — J439 Emphysema, unspecified: Secondary | ICD-10-CM

## 2019-02-04 DIAGNOSIS — Z0000 Encounter for general adult medical examination without abnormal findings: Secondary | ICD-10-CM

## 2019-02-04 DIAGNOSIS — I25119 Atherosclerotic heart disease of native coronary artery with unspecified angina pectoris: Secondary | ICD-10-CM | POA: Diagnosis not present

## 2019-02-04 MED ORDER — TRIAMCINOLONE ACETONIDE 0.1 % EX CREA: 1.0000 "application " | TOPICAL_CREAM | Freq: Two times a day (BID) | CUTANEOUS | 1 refills | Status: DC | PRN

## 2019-02-04 NOTE — Assessment & Plan Note (Signed)
Better off cigarettes Hasn't needed inhaler lately

## 2019-02-04 NOTE — Assessment & Plan Note (Signed)
BP Readings from Last 3 Encounters:  02/04/19 110/76  09/10/18 126/80  07/27/18 124/64   No angina on the isosorbide BP is fine now

## 2019-02-04 NOTE — Assessment & Plan Note (Signed)
Fairly healthy Discussed stopping the e-cigs Colon due 2023 Flu vaccine in fall shingrix when available Discussed exercise

## 2019-02-04 NOTE — Progress Notes (Signed)
Subjective:    Patient ID: Ryan SquiresMark A Cantrell, male    DOB: 1961/05/06, 58 y.o.   MRN: [id number]  HPI Here with wife for PE Doing well Still working---job is essential  No new concerns Just had virtual visit with Dr Herbie BaltimoreHarding --some question about his BP but is fine now No chest pain Not exercising--discussed  Still with e-cigs--discussed No cough Breathing is better since stopping smoking  Current Outpatient Medications on File Prior to Visit  Medication Sig Dispense Refill  . acetaminophen (TYLENOL) 500 MG tablet Take 1,000 mg by mouth 2 (two) times daily as needed for mild pain or moderate pain.     Marland Kitchen. atorvastatin (LIPITOR) 20 MG tablet Take 1 tablet (20 mg total) by mouth at bedtime. 90 tablet 2  . clopidogrel (PLAVIX) 75 MG tablet TAKE 1 TABLET BY MOUTH  DAILY 90 tablet 2  . isosorbide mononitrate (IMDUR) 30 MG 24 hr tablet Take 1 tablet (30 mg total) by mouth daily. 90 tablet 1  . metoprolol succinate (TOPROL-XL) 25 MG 24 hr tablet TAKE 1 TABLET BY MOUTH  DAILY. 90 tablet 1  . nitroGLYCERIN (NITROSTAT) 0.4 MG SL tablet Place 1 tablet (0.4 mg total) under the tongue every 5 (five) minutes as needed for chest pain. 25 tablet 3  . triamcinolone cream (KENALOG) 0.1 % Apply 1 application topically 2 (two) times daily as needed. (Patient taking differently: Apply 1 application topically 2 (two) times daily as needed (irritation). ) 45 g 1   No current facility-administered medications on file prior to visit.     Allergies  Allergen Reactions  . Codeine Sulfate Nausea And Vomiting    Past Medical History:  Diagnosis Date  . CAD S/P percutaneous coronary angioplasty: Inf STEMI - 100% dRCA - PCI Promus P 2.5 mm x 12mm (2.748mm) 05/29/2014   a. MI 05/29/14 dRCA PCI - Promus Premier DES 2.5 mm x 12 mm (2.8 mm); b. Echo: EF 55-60%, mild basal-inferior HK, normal DD  . COPD (chronic obstructive pulmonary disease) (HCC)     PFTs 2013.. Mod severe COPD - Emphysema  . ST elevation myocardial  infarction (STEMI) of inferior wall, initial episode of care (HCC) 05/29/2014   Echocardiogram 05/30/2014: EF 55-60%. Mild HK of the basal inferior wall. Normal valves    Past Surgical History:  Procedure Laterality Date  . Fracture left humerus  2009   no surgery. Torn rotator cuff also  . INGUINAL HERNIA REPAIR     twice on right and once on left-- 1980's-90's  . LEFT HEART CATH AND CORONARY ANGIOGRAPHY  05/29/2014   a. MI 05/29/14 dRCA 100%; D1 ~45%, Normal EF __> PCI to RCA  . NM MYOVIEW ([facility] HX)  03/03/2018    LOW RISK. Small size, partially reversible apical defect - suggestive of scar & small peri-infarct ischemia.  EF 55-60%.   . PERCUTANEOUS CORONARY STENT INTERVENTION (PCI-S)     dRCA - Promus Premie DES 2.5 mm x 12 mm (2.8 mm)  . Right shoulder surgery  1987  . TRANSTHORACIC ECHOCARDIOGRAM  03/03/2018   EF 55-60%.  No R WMA, normal diastolic parameters.  Normal valves.    Family History  Problem Relation Age of Onset  . Hypertension Mother   . Diabetes Mother   . Heart disease Mother        stent  . Cancer Father        prostate  . Hypertension Father   . Gout Father   . Heart disease Father  Stent  . Colon cancer Neg Hx   . Stomach cancer Neg Hx     Social History   Socioeconomic History  . Marital status: Married    Spouse name: Not on file  . Number of children: 1  . Years of education: Not on file  . Highest education level: Not on file  Occupational History  . Occupation: Control and instrumentation engineer  Social Needs  . Financial resource strain: Not on file  . Food insecurity:    Worry: Not on file    Inability: Not on file  . Transportation needs:    Medical: Not on file    Non-medical: Not on file  Tobacco Use  . Smoking status: Former Smoker    Packs/day: 1.00    Types: E-cigarettes    Last attempt to quit: 05/29/2014    Years since quitting: 4.6  . Smokeless tobacco: Never Used  . Tobacco comment: occ e-cigarette--no cigarettes since MI   Substance and Sexual Activity  . Alcohol use: No    Alcohol/week: 0.0 standard drinks  . Drug use: No  . Sexual activity: Yes  Lifestyle  . Physical activity:    Days per week: Not on file    Minutes per session: Not on file  . Stress: Not on file  Relationships  . Social connections:    Talks on phone: Not on file    Gets together: Not on file    Attends religious service: Not on file    Active member of club or organization: Not on file    Attends meetings of clubs or organizations: Not on file    Relationship status: Not on file  . Intimate partner violence:    Fear of current or ex partner: Not on file    Emotionally abused: Not on file    Physically abused: Not on file    Forced sexual activity: Not on file  Other Topics Concern  . Not on file  Social History Narrative   Married - 1 child.   Smokes 1/2 ppd.   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   Review of Systems  Constitutional: Negative for fatigue and unexpected weight change.       Wears seat belt  HENT: Positive for hearing loss. Negative for tinnitus and trouble swallowing.        Full dentures now--temporary for now (then will get implants in bottom to secure them)  Eyes: Negative for visual disturbance.       No diplopia or unilateral vision loss  Respiratory: Negative for cough, chest tightness and shortness of breath.   Cardiovascular: Negative for chest pain, palpitations and leg swelling.  Gastrointestinal: Negative for abdominal pain, blood in stool and constipation.       No heartburn  Endocrine: Negative for polydipsia and polyuria.  Genitourinary: Negative for difficulty urinating and urgency.       Some ED---can't use sildenafil due to isosorbide  Musculoskeletal: Negative for arthralgias, back pain and joint swelling.  Skin:       Eczema flared on foot--TAC helps  Allergic/Immunologic: Negative for environmental allergies and immunocompromised state.   Neurological: Negative for dizziness, syncope, light-headedness and headaches.  Hematological: Negative for adenopathy. Does not bruise/bleed easily.  Psychiatric/Behavioral: Negative for dysphoric mood and sleep disturbance. The patient is not nervous/anxious.        Objective:   Physical Exam  Constitutional: He is oriented to person, place, and time. He appears well-developed. No distress.  HENT:  Head: Normocephalic and atraumatic.  Right Ear: External ear normal.  Left Ear: External ear normal.  Mouth/Throat: Oropharynx is clear and moist. No oropharyngeal exudate.  Eyes: Pupils are equal, round, and reactive to light. Conjunctivae are normal.  Neck: No thyromegaly present.  Cardiovascular: Normal rate, regular rhythm, normal heart sounds and intact distal pulses. Exam reveals no gallop.  No murmur heard. Respiratory: Effort normal and breath sounds normal. No respiratory distress. He has no wheezes. He has no rales.  GI: Soft. There is no abdominal tenderness.  Musculoskeletal:        General: No tenderness or edema.  Lymphadenopathy:    He has no cervical adenopathy.  Neurological: He is alert and oriented to person, place, and time.  Skin:  Slight eczematous rash by left heel  Psychiatric: He has a normal mood and affect. His behavior is normal.           Assessment & Plan:

## 2019-02-05 LAB — COMPREHENSIVE METABOLIC PANEL
ALT: 13 U/L (ref 0–53)
AST: 16 U/L (ref 0–37)
Albumin: 4.8 g/dL (ref 3.5–5.2)
Alkaline Phosphatase: 63 U/L (ref 39–117)
BUN: 8 mg/dL (ref 6–23)
CO2: 30 mEq/L (ref 19–32)
Calcium: 9.6 mg/dL (ref 8.4–10.5)
Chloride: 98 mEq/L (ref 96–112)
Creatinine, Ser: 0.93 mg/dL (ref 0.40–1.50)
GFR: 83.68 mL/min (ref >60.00)
Glucose, Bld: 92 mg/dL (ref 70–99)
Potassium: 4 mEq/L (ref 3.5–5.1)
Sodium: 135 mEq/L (ref 135–145)
Total Bilirubin: 2.7 mg/dL — ABNORMAL HIGH (ref 0.2–1.2)
Total Protein: 7.4 g/dL (ref 6.0–8.3)

## 2019-02-05 LAB — LIPID PANEL
Cholesterol: 97 mg/dL (ref 0–200)
HDL: 34.6 mg/dL — ABNORMAL LOW (ref >39.00)
LDL Cholesterol: 46 mg/dL (ref 0–99)
NonHDL: 62.51
Total CHOL/HDL Ratio: 3
Triglycerides: 82 mg/dL (ref 0.0–149.0)
VLDL: 16.4 mg/dL (ref 0.0–40.0)

## 2019-02-05 LAB — CBC
HCT: 40.1 % (ref 39.0–52.0)
Hemoglobin: 13.8 g/dL (ref 13.0–17.0)
MCHC: 34.6 g/dL (ref 30.0–36.0)
MCV: 85.9 fl (ref 78.0–100.0)
Platelets: 302 10*3/uL (ref 150.0–400.0)
RBC: 4.66 Mil/uL (ref 4.22–5.81)
RDW: 12.9 % (ref 11.5–15.5)
WBC: 5.8 10*3/uL (ref 4.0–10.5)

## 2019-02-05 LAB — PSA: PSA: 3.6 ng/mL (ref 0.10–4.00)

## 2019-02-15 ENCOUNTER — Other Ambulatory Visit: Payer: 59

## 2019-02-15 ENCOUNTER — Telehealth: Payer: Self-pay | Admitting: *Deleted

## 2019-02-15 DIAGNOSIS — Z20822 Contact with and (suspected) exposure to covid-19: Secondary | ICD-10-CM

## 2019-02-15 NOTE — Telephone Encounter (Signed)
-----   Message from Venia Carbon, MD sent at 02/13/2019  8:07 AM EDT ----- Please arrange for SARS testing for this patient. He has had multiple exposures at work and is very anxious due to his chronic medical conditions  Thanks, Drusilla Kanner

## 2019-02-15 NOTE — Telephone Encounter (Signed)
-----   Message from Richard I Letvak, MD sent at 02/13/2019  8:07 AM EDT ----- Please arrange for SARS testing for this patient. He has had multiple exposures at work and is very anxious due to his chronic medical conditions  Thanks, Rich Letvak  

## 2019-02-15 NOTE — Telephone Encounter (Signed)
Attempted to call patient, no answer. Will try to call back and schedule for the covid-19 test.

## 2019-02-15 NOTE — Telephone Encounter (Signed)
Pt referred to be tested by Dr Silvio Pate, because of exposures to covid-19.  Pt and wife notified to schedule for today at the [facility] building at 3:45. Advised of this being a drive thru testing site and to stay in car with mask on and windows rolled up and until ready to be tested, with verbal understanding.

## 2019-02-16 LAB — NOVEL CORONAVIRUS, NAA: SARS-CoV-2, NAA: NOT DETECTED

## 2019-03-03 ENCOUNTER — Telehealth: Payer: Self-pay | Admitting: Internal Medicine

## 2019-03-03 NOTE — Telephone Encounter (Signed)
I left a detailed message on patient's voice mail to call back and schedule shingrix vaccine.

## 2019-03-03 NOTE — Telephone Encounter (Signed)
Please call pt to set up shingrix vaccine from wait list. Best dates are 7/14, 7/21, 7/28, 8/4, 8/11. Slots are 1130, 1145, 1200, 230, 245, 300, 315, 330. °

## 2019-06-17 ENCOUNTER — Other Ambulatory Visit: Payer: Self-pay

## 2019-06-17 ENCOUNTER — Ambulatory Visit (INDEPENDENT_AMBULATORY_CARE_PROVIDER_SITE_OTHER): Payer: BC Managed Care – PPO | Admitting: Internal Medicine

## 2019-06-17 ENCOUNTER — Encounter: Payer: Self-pay | Admitting: Internal Medicine

## 2019-06-17 VITALS — Temp 98.0°F

## 2019-06-17 DIAGNOSIS — R52 Pain, unspecified: Secondary | ICD-10-CM

## 2019-06-17 DIAGNOSIS — Z20828 Contact with and (suspected) exposure to other viral communicable diseases: Secondary | ICD-10-CM | POA: Diagnosis not present

## 2019-06-17 DIAGNOSIS — G44209 Tension-type headache, unspecified, not intractable: Secondary | ICD-10-CM

## 2019-06-17 DIAGNOSIS — J029 Acute pharyngitis, unspecified: Secondary | ICD-10-CM | POA: Diagnosis not present

## 2019-06-17 DIAGNOSIS — Z20822 Contact with and (suspected) exposure to covid-19: Secondary | ICD-10-CM

## 2019-06-17 MED ORDER — PREDNISONE 10 MG PO TABS: ORAL_TABLET | ORAL | 0 refills | Status: DC

## 2019-06-17 NOTE — Patient Instructions (Signed)

## 2019-06-17 NOTE — Progress Notes (Signed)
Virtual Visit via Video Note  I connected with Ryan Cantrell on 06/17/19 at  2:15 PM EDT by a video enabled telemedicine application and verified that I am speaking with the correct person using two identifiers.  Location: Patient: Home Provider: Office   I discussed the limitations of evaluation and management by telemedicine and the availability of in person appointments. The patient expressed understanding and agreed to proceed.  History of Present Illness:  Pt reports headache, sore throat and body aches. This started 3-4 days ago. The headache is located in his temples. He describes the pain as constant pressure. He denies dizziness or visual changes. He has some post nasal drip but denies difficulty swallowing. He denies runny nose, nasal congestion, ear pain, SOB, loss of taste or smell. He denies fever or chills but generally feels weak and fatigued. He has not had sick contacts or exposure to COVID that he is aware of but he did go get tested earlier today. He has taken Tylenol with minimal relief.   Past Medical History:  Diagnosis Date  . CAD S/P percutaneous coronary angioplasty: Inf STEMI - 100% dRCA - PCI Promus P 2.5 mm x 1mm (2.42mm) 05/29/2014   a. MI 05/29/14 dRCA PCI - Promus Premier DES 2.5 mm x 12 mm (2.8 mm); b. Echo: EF 55-60%, mild basal-inferior HK, normal DD  . COPD (chronic obstructive pulmonary disease) ([place])     PFTs 2013.. Mod severe COPD - Emphysema  . ST elevation myocardial infarction (STEMI) of inferior wall, initial episode of care ([place]) 05/29/2014   Echocardiogram 05/30/2014: EF 55-60%. Mild HK of the basal inferior wall. Normal valves    Current Outpatient Medications  Medication Sig Dispense Refill  . acetaminophen (TYLENOL) 500 MG tablet Take 1,000 mg by mouth 2 (two) times daily as needed for mild pain or moderate pain.     Marland Kitchen atorvastatin (LIPITOR) 20 MG tablet Take 1 tablet (20 mg total) by mouth at bedtime. 90 tablet 2  . clopidogrel (PLAVIX) 75 MG  tablet TAKE 1 TABLET BY MOUTH  DAILY 90 tablet 2  . isosorbide mononitrate (IMDUR) 30 MG 24 hr tablet Take 1 tablet (30 mg total) by mouth daily. 90 tablet 1  . metoprolol succinate (TOPROL-XL) 25 MG 24 hr tablet TAKE 1 TABLET BY MOUTH  DAILY. 90 tablet 1  . nitroGLYCERIN (NITROSTAT) 0.4 MG SL tablet Place 1 tablet (0.4 mg total) under the tongue every 5 (five) minutes as needed for chest pain. 25 tablet 3  . triamcinolone cream (KENALOG) 0.1 % Apply 1 application topically 2 (two) times daily as needed. 45 g 1   No current facility-administered medications for this visit.     Allergies  Allergen Reactions  . Codeine Sulfate Nausea And Vomiting    Family History  Problem Relation Age of Onset  . Hypertension Mother   . Diabetes Mother   . Heart disease Mother        stent  . Cancer Father        prostate  . Hypertension Father   . Gout Father   . Heart disease Father        Stent  . Colon cancer Neg Hx   . Stomach cancer Neg Hx     Social History   Socioeconomic History  . Marital status: Married    Spouse name: Not on file  . Number of children: 1  . Years of education: Not on file  . Highest education level: Not on file  Occupational History  . Occupation: Control and instrumentation engineerress operator  Social Needs  . Financial resource strain: Not on file  . Food insecurity    Worry: Not on file    Inability: Not on file  . Transportation needs    Medical: Not on file    Non-medical: Not on file  Tobacco Use  . Smoking status: Former Smoker    Packs/day: 1.00    Types: E-cigarettes    Quit date: 05/29/2014    Years since quitting: 5.0  . Smokeless tobacco: Never Used  . Tobacco comment: occ e-cigarette--no cigarettes since MI  Substance and Sexual Activity  . Alcohol use: No    Alcohol/week: 0.0 standard drinks  . Drug use: No  . Sexual activity: Yes  Lifestyle  . Physical activity    Days per week: Not on file    Minutes per session: Not on file  . Stress: Not on file   Relationships  . Social Musicianconnections    Talks on phone: Not on file    Gets together: Not on file    Attends religious service: Not on file    Active member of club or organization: Not on file    Attends meetings of clubs or organizations: Not on file    Relationship status: Not on file  . Intimate partner violence    Fear of current or ex partner: Not on file    Emotionally abused: Not on file    Physically abused: Not on file    Forced sexual activity: Not on file  Other Topics Concern  . Not on file  Social History Narrative   Married - 1 child.   Smokes 1/2 ppd.   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     Constitutional: Pt reports headaches. Denies fever, malaise, fatigue, or abrupt weight changes.  HEENT: Pt reports sore throat. Denies eye pain, eye redness, ear pain, ringing in the ears, wax buildup, runny nose, nasal congestion, bloody nose. Respiratory: Denies difficulty breathing, shortness of breath, cough or sputum production.   Cardiovascular: Denies chest pain, chest tightness, palpitations or swelling in the hands or feet.  Musculoskeletal: Pt reports body aches. Denies decrease in range of motion, difficulty with gait, or joint pain and swelling.   No other specific complaints in a complete review of systems (except as listed in HPI above).  Observations/Objective:   Wt Readings from Last 3 Encounters:  02/04/19 141 lb (64 kg)  09/10/18 142 lb (64.4 kg)  07/27/18 141 lb 12.8 oz (64.3 kg)    General: Appears his stated age, well developed, well nourished in NAD. Skin: Warm, dry and intact. No rashes noted. Pulmonary/Chest: Normal effort. No respiratory distress.   Neurological: Alert and oriented.   BMET    Component Value Date/Time   NA 135 02/04/2019 1513   NA 133 (L) 05/29/2014 1048   K 4.0 02/04/2019 1513   K 4.2 05/29/2014 1048   CL 98 02/04/2019 1513   CL 96 (L) 05/29/2014 1048   CO2 30 02/04/2019 1513   CO2  32 05/29/2014 1048   GLUCOSE 92 02/04/2019 1513   GLUCOSE 151 (H) 05/29/2014 1048   BUN 8 02/04/2019 1513   BUN 4 (L) 05/29/2014 1048   CREATININE 0.93 02/04/2019 1513   CREATININE 0.92 05/29/2014 1048   CALCIUM 9.6 02/04/2019 1513   CALCIUM 8.7 05/29/2014 1048   GFRNONAA >60 03/03/2018 0131   GFRNONAA >60 05/29/2014 1048   GFRAA >60 03/03/2018  0131   GFRAA >60 05/29/2014 1048    Lipid Panel     Component Value Date/Time   CHOL 97 02/04/2019 1513   CHOL 100 10/31/2014 0810   TRIG 82.0 02/04/2019 1513   HDL 34.60 (L) 02/04/2019 1513   HDL 34 (L) 10/31/2014 0810   CHOLHDL 3 02/04/2019 1513   VLDL 16.4 02/04/2019 1513   LDLCALC 46 02/04/2019 1513   LDLCALC 41 10/31/2014 0810    CBC    Component Value Date/Time   WBC 5.8 02/04/2019 1513   RBC 4.66 02/04/2019 1513   HGB 13.8 02/04/2019 1513   HGB 14.2 05/29/2014 1048   HCT 40.1 02/04/2019 1513   HCT 44.1 05/29/2014 1048   PLT 302.0 02/04/2019 1513   PLT 349 05/29/2014 1048   MCV 85.9 02/04/2019 1513   MCV 88 05/29/2014 1048   MCH 29.7 03/02/2018 1904   MCHC 34.6 02/04/2019 1513   RDW 12.9 02/04/2019 1513   RDW 13.2 05/29/2014 1048   LYMPHSABS 1.4 06/03/2018 1507   LYMPHSABS 1.4 05/29/2014 1048   MONOABS 0.8 06/03/2018 1507   MONOABS 0.9 05/29/2014 1048   EOSABS 0.1 06/03/2018 1507   EOSABS 0.1 05/29/2014 1048   BASOSABS 0.0 06/03/2018 1507   BASOSABS 0.1 05/29/2014 1048    Hgb A1C Lab Results  Component Value Date   HGBA1C 5.7 (H) 05/30/2014       Assessment and Plan: Acute Headache, Sore Throat, Body Aches:  COVID test pending.  He understands he needs to self quarantine until his results are back Discussed the importance of hand washing, masking and social distancing This could be allergies RX for Pred Taper x 6 days- avoid NSAID's OTC Could try Zyrtec and Flonase OTC ER precautions discussed    Follow Up Instructions:    I discussed the assessment and treatment plan with the patient. The  patient was provided an opportunity to ask questions and all were answered. The patient agreed with the plan and demonstrated an understanding of the instructions.   The patient was advised to call back or seek an in-person evaluation if the symptoms worsen or if the condition fails to improve as anticipated.     Nicki Reaper, NP

## 2019-06-19 LAB — NOVEL CORONAVIRUS, NAA: SARS-CoV-2, NAA: DETECTED — AB

## 2019-06-20 ENCOUNTER — Telehealth: Payer: Self-pay | Admitting: Family Medicine

## 2019-06-20 NOTE — Telephone Encounter (Signed)
Call received from the call center re: positive covid test Called patient-he is aware  He was given a course of prednisone which greatly helped headache and he has no fever  This is re assuring I inst him to continue to isolate himself and to go to ED if symptoms suddenly become severe  Will route to pCP

## 2019-06-21 NOTE — Telephone Encounter (Signed)
Please start COVID monitoring on him

## 2019-06-22 NOTE — Telephone Encounter (Signed)
Spoke to pt's wife. Michela Pitcher he is not running a fever. No shortness of breath. Headache has subsided. Nasal congestion has gotten better. Family is in quarantine.   I will check on him in 1-2 days.

## 2019-07-21 ENCOUNTER — Other Ambulatory Visit: Payer: Self-pay | Admitting: Cardiology

## 2019-07-21 DIAGNOSIS — R079 Chest pain, unspecified: Secondary | ICD-10-CM

## 2019-07-21 DIAGNOSIS — I2510 Atherosclerotic heart disease of native coronary artery without angina pectoris: Secondary | ICD-10-CM

## 2019-07-21 DIAGNOSIS — I2119 ST elevation (STEMI) myocardial infarction involving other coronary artery of inferior wall: Secondary | ICD-10-CM

## 2019-07-23 ENCOUNTER — Other Ambulatory Visit: Payer: Self-pay | Admitting: Cardiology

## 2019-07-23 MED ORDER — ATORVASTATIN CALCIUM 20 MG PO TABS: 20.0000 mg | ORAL_TABLET | Freq: Every day | ORAL | 2 refills | Status: DC

## 2019-08-03 ENCOUNTER — Ambulatory Visit: Payer: BC Managed Care – PPO | Admitting: Cardiology

## 2019-08-03 ENCOUNTER — Other Ambulatory Visit: Payer: Self-pay

## 2019-08-03 ENCOUNTER — Encounter: Payer: Self-pay | Admitting: Cardiology

## 2019-08-03 VITALS — BP 132/78 | HR 65 | Temp 98.2°F | Ht 69.0 in | Wt 145.4 lb

## 2019-08-03 DIAGNOSIS — Z9861 Coronary angioplasty status: Secondary | ICD-10-CM

## 2019-08-03 DIAGNOSIS — I2119 ST elevation (STEMI) myocardial infarction involving other coronary artery of inferior wall: Secondary | ICD-10-CM | POA: Diagnosis not present

## 2019-08-03 DIAGNOSIS — N5201 Erectile dysfunction due to arterial insufficiency: Secondary | ICD-10-CM

## 2019-08-03 DIAGNOSIS — I25119 Atherosclerotic heart disease of native coronary artery with unspecified angina pectoris: Secondary | ICD-10-CM | POA: Diagnosis not present

## 2019-08-03 DIAGNOSIS — R030 Elevated blood-pressure reading, without diagnosis of hypertension: Secondary | ICD-10-CM

## 2019-08-03 DIAGNOSIS — I2510 Atherosclerotic heart disease of native coronary artery without angina pectoris: Secondary | ICD-10-CM | POA: Diagnosis not present

## 2019-08-03 DIAGNOSIS — E785 Hyperlipidemia, unspecified: Secondary | ICD-10-CM

## 2019-08-03 MED ORDER — ISOSORBIDE MONONITRATE ER 30 MG PO TB24: 30.0000 mg | ORAL_TABLET | ORAL | 6 refills | Status: DC | PRN

## 2019-08-03 NOTE — Progress Notes (Signed)
Primary Care Provider: Karie Schwalbe, MD Cardiologist: Bryan Lemma, MD Electrophysiologist:   Clinic Note: Chief Complaint  Patient presents with  . Follow-up    5 to 6 months, post COVID-19  . Coronary Artery Disease    No angina   HPI:    DAKIN MADANI is a 58 y.o. male with a PMH of inferior STEMI with PCI to the RCA in September 2015 along with moderate to severe COPD (quit smoking after MI) who presents today for 60-month follow-up (notably following COVID-19 infection).   Inferior STEMI November 2015: 1005 dRCA treated with DES  Myoview July 2019 showed a small sized inferoapical scar with mild reversibility.  EF 55-60%.  Echo July 2019 normal EF with no regional wall motion normality.  JERMINE BIBBEE was last seen on February 02, 2019 via telemedicine following his dental surgery with extensive teeth removal preparations for permanent dentures.  He has not yet had the neck stage which would be implants for brackets for the permanent dentures.  He is questioning whether not he wants to do this or not. --> Denied any active cardiac symptoms.  Only occasional skipped beats.  Recent Hospitalizations:   Not a hospitalization, but seen via telehealth by PCP for body aches and sore throat shortly after receiving the flu vaccine.  I think mostly a headache and some nausea.  Was tested for COVID-19 and will been positive. ->  Most notable symptom was headache nausea and congestion.  No real fevers no cough or worsening dyspnea.  Never went to the hospital.  Now convalescing well.  Reviewed  CV studies:    The following studies were reviewed today: (if available, images/films reviewed: From Epic Chart or Care Everywhere) . None:   Interval History:   SANJIV CASTORENA returns here today with his wife.  He is doing very well.  Seems to have recovered quite well from the COVID-19 infection.  Not having any untoward effects from a cardiac standpoint with no significant dyspnea either at  rest or exertion.  No PND, orthopnea or edema.  No significant palpitations more than just a few skipped beats here and there.  His headache and congestion has also resolved.  It was starting to resolve with prednisone given prior to his COVID-19 test turning back positive.  Thankfully nobody else in his family contracted COVID-19 from him.  He is now back to work doing well with no major complaints. His dental procedures were taken brief break because of the pandemic.  He is now on the stage of trying to determine whether or not he wants to go forward with the implants for permanent brackets for his dentures.  He is concerned about doing more procedures and is open to talk with her dentist about this.  There is a limited time for when to do this.   CV Review of Symptoms (Summary)no chest pain or dyspnea on exertion positive for - Rare skipped beats negative for - edema, orthopnea, palpitations, paroxysmal nocturnal dyspnea, rapid heart rate, shortness of breath or Syncope/near syncope, TIA/amaurosis fugax, claudication.  The patient does not have symptoms concerning for COVID-19 infection (fever, chills, cough, or new shortness of breath).  The patient is practicing social distancing. ++ Masking.  He does go out routinely for groceries/shopping.  He is back to work -ensuring adequate distancing, masking and hand hygiene   REVIEWED OF SYSTEMS   A comprehensive ROS was performed. Review of Systems  Constitutional: Negative for malaise/fatigue (He only had  a couple days of this with Covid).  HENT: Negative for congestion and sinus pain.        No further symptoms since COVID-19  Respiratory: Negative for cough, shortness of breath and wheezing.        This is gotten much better since he quit smoking years ago.  Cardiovascular: Negative for leg swelling.  Gastrointestinal: Negative for blood in stool and melena.  Genitourinary: Negative for hematuria.  Musculoskeletal: Negative for falls and  joint pain.  Neurological: Negative for dizziness, focal weakness and headaches.  Psychiatric/Behavioral: Negative for depression and memory loss (Not necessarily memory loss, just poor retention.  He tends to forget details). The patient has insomnia (Has a hard time getting back to sleep after he wakes up for nocturia.). The patient is not nervous/anxious.     I have reviewed and (if needed) personally updated the patient's problem list, medications, allergies, past medical and surgical history, social and family history.   PAST MEDICAL HISTORY   Past Medical History:  Diagnosis Date  . CAD S/P percutaneous coronary angioplasty: Inf STEMI - 100% dRCA - PCI Promus P 2.5 mm x 12mm (2.3mm) 05/29/2014   a. MI 05/29/14 dRCA PCI - Promus Premier DES 2.5 mm x 12 mm (2.8 mm); b. Echo: EF 55-60%, mild basal-inferior HK, normal DD  . COPD (chronic obstructive pulmonary disease) ([facility])     PFTs 2013.. Mod severe COPD - Emphysema  . ST elevation myocardial infarction (STEMI) of inferior wall, initial episode of care ([facility]) 05/29/2014   Echocardiogram 05/30/2014: EF 55-60%. Mild HK of the basal inferior wall. Normal valves    PAST SURGICAL HISTORY   Past Surgical History:  Procedure Laterality Date  . Fracture left humerus  2009   no surgery. Torn rotator cuff also  . INGUINAL HERNIA REPAIR     twice on right and once on left-- 1980's-90's  . LEFT HEART CATH AND CORONARY ANGIOGRAPHY  05/29/2014   a. MI 05/29/14 dRCA 100%; D1 ~45%, Normal EF __> PCI to RCA  . NM MYOVIEW ([facility] HX)  03/03/2018    LOW RISK. Small size, partially reversible apical defect - suggestive of scar & small peri-infarct ischemia.  EF 55-60%.   . PERCUTANEOUS CORONARY STENT INTERVENTION (PCI-S)     dRCA - Promus Premie DES 2.5 mm x 12 mm (2.8 mm)  . Right shoulder surgery  1987  . TRANSTHORACIC ECHOCARDIOGRAM  03/03/2018   EF 55-60%.  No R WMA, normal diastolic parameters.  Normal valves.     MEDICATIONS/ALLERGIES    Current Meds  Medication Sig  . acetaminophen (TYLENOL) 500 MG tablet Take 1,000 mg by mouth 2 (two) times daily as needed for mild pain or moderate pain.   Marland Kitchen atorvastatin (LIPITOR) 20 MG tablet Take 1 tablet (20 mg total) by mouth at bedtime.  . clopidogrel (PLAVIX) 75 MG tablet TAKE 1 TABLET BY MOUTH  DAILY  . isosorbide mononitrate (IMDUR) 30 MG 24 hr tablet Take 1 tablet (30 mg total) by mouth as needed.  . metoprolol succinate (TOPROL-XL) 25 MG 24 hr tablet TAKE 1 TABLET BY MOUTH  DAILY.  . nitroGLYCERIN (NITROSTAT) 0.4 MG SL tablet Place 1 tablet (0.4 mg total) under the tongue every 5 (five) minutes as needed for chest pain.  . predniSONE (DELTASONE) 10 MG tablet Take 6 tabs day 1, 5 tabs day 2, 4 tabs day 3, 3 tabs day 4, 2 tabs day 5, 1 tab day 6  . triamcinolone cream (KENALOG) 0.1 %  Apply 1 application topically 2 (two) times daily as needed.  . [DISCONTINUED] isosorbide mononitrate (IMDUR) 30 MG 24 hr tablet TAKE 1 TABLET(30 MG) BY MOUTH DAILY    Allergies  Allergen Reactions  . Codeine Sulfate Nausea And Vomiting    SOCIAL HISTORY/FAMILY HISTORY   Social History   Tobacco Use  . Smoking status: Former Smoker    Packs/day: 1.00    Types: E-cigarettes    Quit date: 05/29/2014    Years since quitting: 5.1  . Smokeless tobacco: Never Used  . Tobacco comment: occ e-cigarette--no cigarettes since MI  Substance Use Topics  . Alcohol use: No    Alcohol/week: 0.0 standard drinks  . Drug use: No   Social History   Social History Narrative   Married - 1 child.   Smokes 1/2 ppd.   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    Family History family history includes Cancer in his father; Diabetes in his mother; Gout in his father; Heart disease in his father and mother; Hypertension in his father and mother.   OBJCTIVE -PE, EKG, labs   Wt Readings from Last 3 Encounters:  08/03/19 145 lb 6.4 oz (66 kg)  02/04/19 141 lb (64 kg)  09/10/18  142 lb (64.4 kg)    Physical Exam: BP 132/78   Pulse 65   Temp 98.2 F (36.8 C)   Ht  (1.753 m)   Wt 145 lb 6.4 oz (66 kg)   SpO2 97%   BMI 21.47 kg/m  Physical Exam  Constitutional: He is oriented to person, place, and time. He appears well-developed and well-nourished. No distress.  Healthy-appearing.  Well-groomed.  HENT:  Head: Normocephalic and atraumatic.  Has brand-new dentures.  They look great!  Neck: Normal range of motion. Neck supple. No hepatojugular reflux and no JVD present. Carotid bruit is not present.  Cardiovascular: Normal rate, regular rhythm, S1 normal, S2 normal and intact distal pulses.  Occasional extrasystoles are present. PMI is not displaced. Exam reveals distant heart sounds. Exam reveals no gallop and no friction rub.  No murmur heard. Pulmonary/Chest: Effort normal and breath sounds normal. No respiratory distress. He has no wheezes. He has no rales.  Abdominal: Soft. Bowel sounds are normal. He exhibits no distension. There is no rebound.  Musculoskeletal: Normal range of motion.        General: No edema.  Neurological: He is alert and oriented to person, place, and time.  Psychiatric: He has a normal mood and affect. His behavior is normal. Judgment and thought content normal.  Vitals reviewed.    Adult ECG Report  Rate: 65 ;  Rhythm: normal sinus rhythm and Right axis deviation (91 degrees).  Nonspecific IVCD;   Narrative Interpretation: Otherwise stable EKG.  Recent Labs:    Lab Results  Component Value Date   CHOL 97 02/04/2019   HDL 34.60 (L) 02/04/2019   LDLCALC 46 02/04/2019   TRIG 82.0 02/04/2019   CHOLHDL 3 02/04/2019   Lab Results  Component Value Date   CREATININE 0.93 02/04/2019   BUN 8 02/04/2019   NA 135 02/04/2019   K 4.0 02/04/2019   CL 98 02/04/2019   CO2 30 02/04/2019    ASSESSMENT/PLAN   Overall, Narayan is doing very well from a cardiac standpoint.  Has done exceedingly well with smoking cessation and is on  minimal medications.  Plan is to attempt to wean off of Imdur to see how he does.  This will be over the next couple months.  Problem List Items Addressed This Visit    ST elevation myocardial infarction (STEMI) of inferior wall, subsequent episode of care ([facility]) (Chronic)   Relevant Medications   isosorbide mononitrate (IMDUR) 30 MG 24 hr tablet   Other Relevant Orders   EKG 12-Lead (Completed)   CAD S/P percutaneous coronary angioplasty: Inf STEMI - 100% dRCA - PCI Promus P 2.5 mm x 92mm (2.72mm) (Chronic)    Now over 5 years out from his MI.  Doing well with no recurrent angina symptoms. Is on low-dose beta-blocker and statin along with Plavix for maintenance.  We can wean off of Imdur going to every other day until current bottle is complete unless he has symptoms, then he would just go back to taking it.   Okay to hold Plavix 5 to 7 days preop, but if no other indication to stop, would continue for maintenance.      Relevant Medications   isosorbide mononitrate (IMDUR) 30 MG 24 hr tablet   Atherosclerotic heart disease of native coronary artery with angina pectoris ([place]) - Primary (Chronic)    Remote single-vessel disease with RCA treated with a stent.  Is now on low-dose beta-blocker, which is much as tolerated along with statin and Plavix. No longer on aspirin to avoid GI issues. Blood pressure is usually borderline low.  As such, would not do more than current dose of Toprol.      Relevant Medications   isosorbide mononitrate (IMDUR) 30 MG 24 hr tablet   Borderline systolic hypertension (Chronic)    Not to be considered to hypertension.  Is only on low-dose Toprol.      Erectile dysfunction (Chronic)    Probably has microvascular disease from long-term smoking.  At present he wants to avoid trying medications.  Thinks it is may be some psychosomatic issues as well. I would like to his wean him off the Imdur, if he does well off Imdur then we can certainly consider as needed  Viagra.      Hyperlipidemia with target LDL less than 70 (Chronic)    Lipids are June showed excellent results.  LDL 46 is well within target bull's-eye range on 20 of atorvastatin.  Continue current dose.  Continue dietary changes and exercise that he is taken up since his MI.      Relevant Medications   isosorbide mononitrate (IMDUR) 30 MG 24 hr tablet      COVID-19 Education: The signs and symptoms of COVID-19 were discussed with the patient and how to seek care for testing (follow up with PCP or arrange E-visit).   The importance of social distancing was discussed today. His wife is with him today and had multiple questions about his Covid infection and concerns going forward.  Also spent a long time discussing his dental surgeries and a potential upcoming surgeries.  We discussed concerning symptoms going forward.  Relatively prolonged visit.  I spent a total of 28 minutes with the patient and chart review. >  50% of the time was spent in direct patient consultation.  Additional time spent with chart review (studies, outside notes, etc): 6 Total Time: 34 min   Current medicines are reviewed at length with the patient today.  (+/- concerns) asked about Imdur, also mentioned issues with erectile dysfunction.   Patient Instructions / Medication Changes & Studies & Tests Ordered   Patient Instructions  Medication Instructions:   WEAN  OFF IMDUR  TAKE EVERY OTHER DAY -  HOLD  ENOUGH  MEDICATIONS 2 WEEKS JUST INCASE YOU NEED IT.  *If you need a refill on your cardiac medications before your next appointment, please call your pharmacy*  Lab Work: NOT NEEDED   Testing/Procedures: NOT NEEDED  Follow-Up: At [facility], you and your health needs are our priority.  As part of our continuing mission to provide you with exceptional heart care, we have created designated Provider Care Teams.  These Care Teams include your primary Cardiologist (physician) and Advanced Practice  Providers (APPs -  Physician Assistants and Nurse Practitioners) who all work together to provide you with the care you need, when you need it.  Your next appointment:   12 month(s)  The format for your next appointment:   In Person  Provider:   Bryan Lemmaavid Breccan Galant, MD  Other Instructions    Studies Ordered:   Orders Placed This Encounter  Procedures  . EKG 12-Lead     Bryan Lemmaavid Reid Nawrot, M.D., M.S. Interventional Cardiologist   Pager # [phone] Phone # [phone] [address] [place], [place] [address]   Thank you for choosing [facility] at [facility]!!

## 2019-08-03 NOTE — Patient Instructions (Addendum)
Medication Instructions:   WEAN  OFF IMDUR  TAKE EVERY OTHER DAY - HOLD  ENOUGH  MEDICATIONS 2 WEEKS JUST INCASE YOU NEED IT.  *If you need a refill on your cardiac medications before your next appointment, please call your pharmacy*  Lab Work: NOT NEEDED   Testing/Procedures: NOT NEEDED  Follow-Up: At [facility], you and your health needs are our priority.  As part of our continuing mission to provide you with exceptional heart care, we have created designated Provider Care Teams.  These Care Teams include your primary Cardiologist (physician) and Advanced Practice Providers (APPs -  Physician Assistants and Nurse Practitioners) who all work together to provide you with the care you need, when you need it.  Your next appointment:   12 month(s)  The format for your next appointment:   In Person  Provider:   Glenetta Hew, MD  Other Instructions

## 2019-08-04 ENCOUNTER — Encounter: Payer: Self-pay | Admitting: Cardiology

## 2019-08-04 NOTE — Assessment & Plan Note (Signed)
Remote single-vessel disease with RCA treated with a stent.  Is now on low-dose beta-blocker, which is much as tolerated along with statin and Plavix. No longer on aspirin to avoid GI issues. Blood pressure is usually borderline low.  As such, would not do more than current dose of Toprol.

## 2019-08-04 NOTE — Assessment & Plan Note (Signed)
Probably has microvascular disease from long-term smoking.  At present he wants to avoid trying medications.  Thinks it is may be some psychosomatic issues as well. I would like to his wean him off the Imdur, if he does well off Imdur then we can certainly consider as needed Viagra.

## 2019-08-04 NOTE — Assessment & Plan Note (Signed)
Not to be considered to hypertension.  Is only on low-dose Toprol.

## 2019-08-04 NOTE — Assessment & Plan Note (Addendum)
Lipids are June showed excellent results.  LDL 46 is well within target bull's-eye range on 20 of atorvastatin.  Continue current dose.  Continue dietary changes and exercise that he is taken up since his MI.

## 2019-08-04 NOTE — Assessment & Plan Note (Signed)
Now over 5 years out from his MI.  Doing well with no recurrent angina symptoms. Is on low-dose beta-blocker and statin along with Plavix for maintenance.  We can wean off of Imdur going to every other day until current bottle is complete unless he has symptoms, then he would just go back to taking it.   Okay to hold Plavix 5 to 7 days preop, but if no other indication to stop, would continue for maintenance.

## 2019-08-28 IMAGING — CR DG CHEST 2V
2 series · 2 of 2 positions shown · non-contrast
Comparison: 02/12/2016

CLINICAL DATA: Chest pain

EXAM:
CHEST - 2 VIEW

[chest pa]
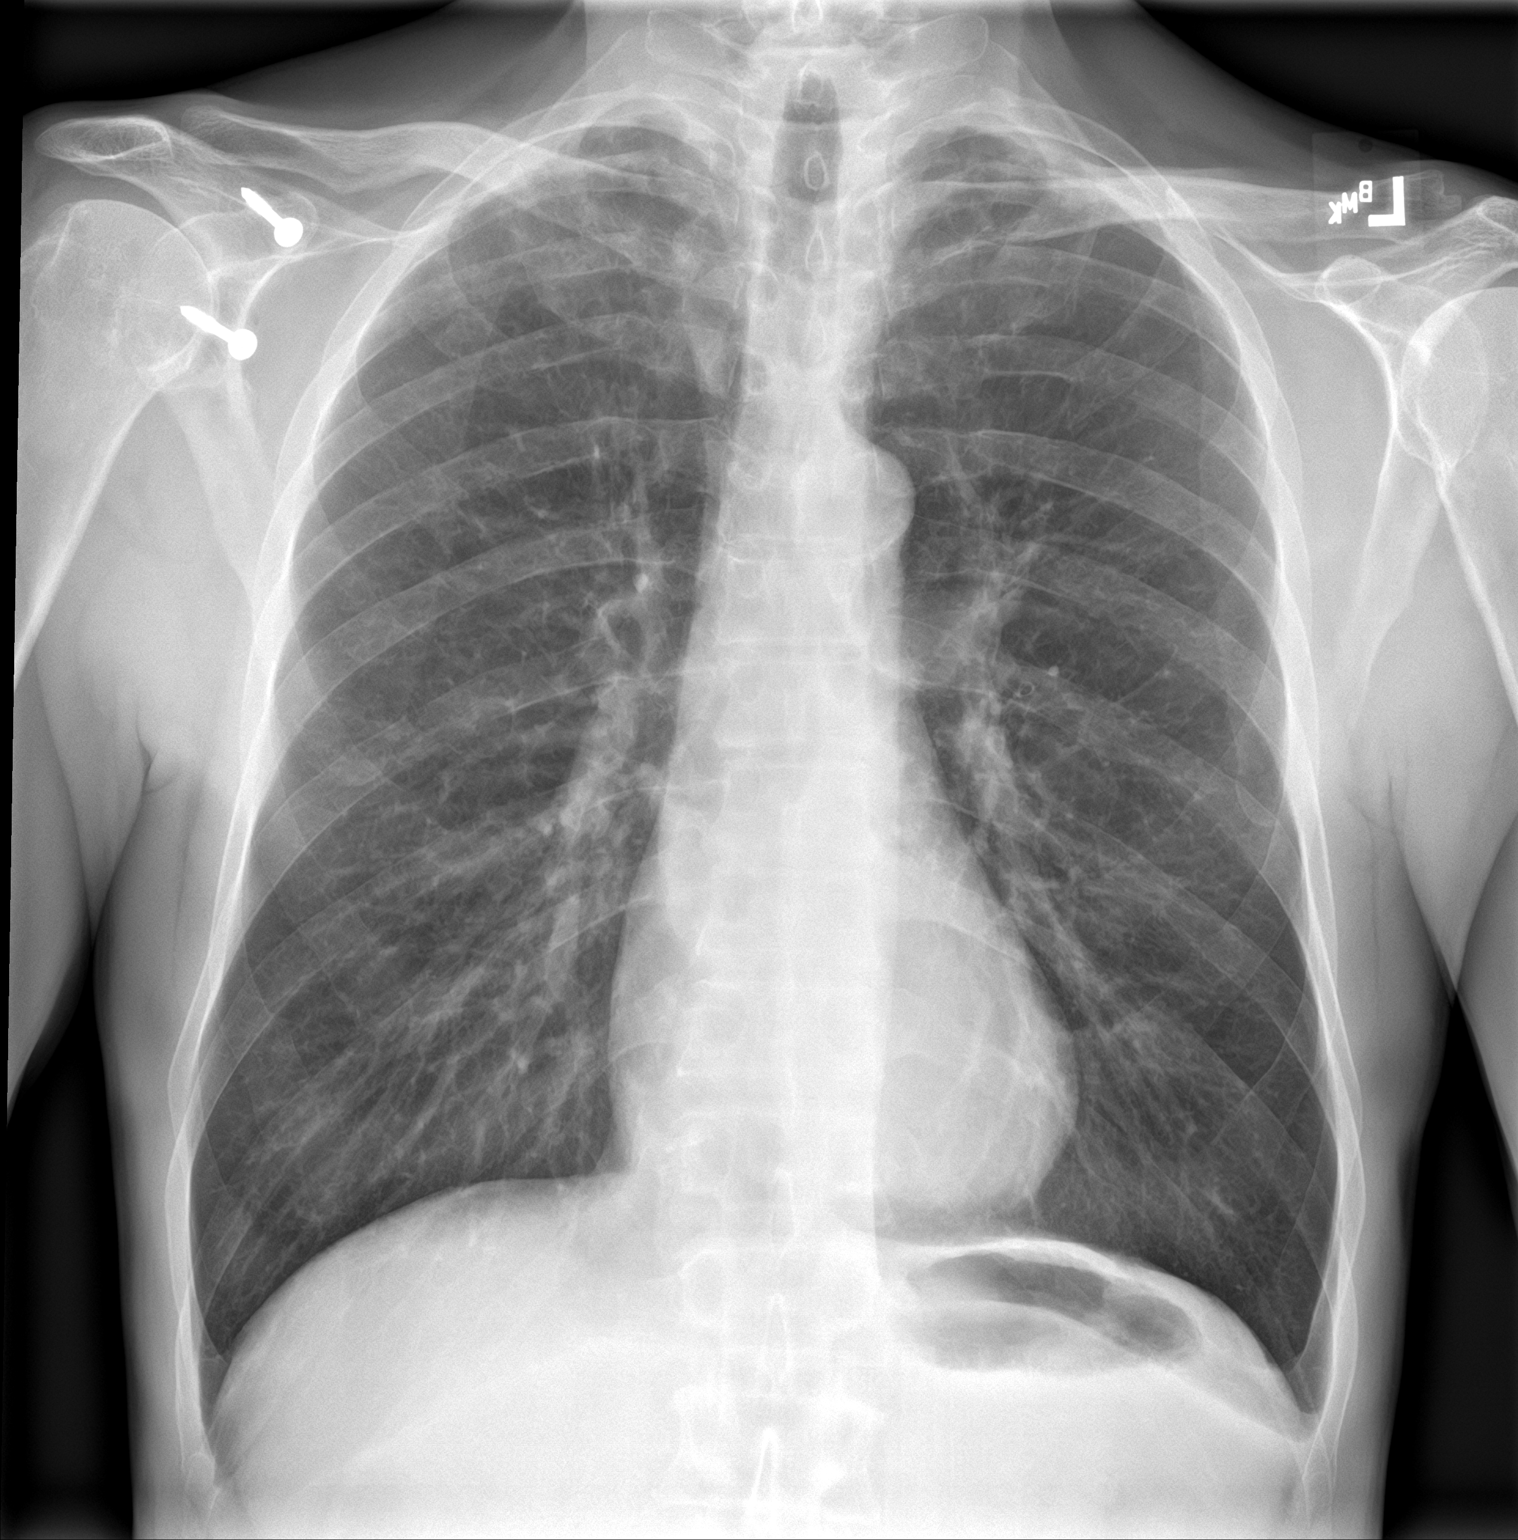

[chest lat]
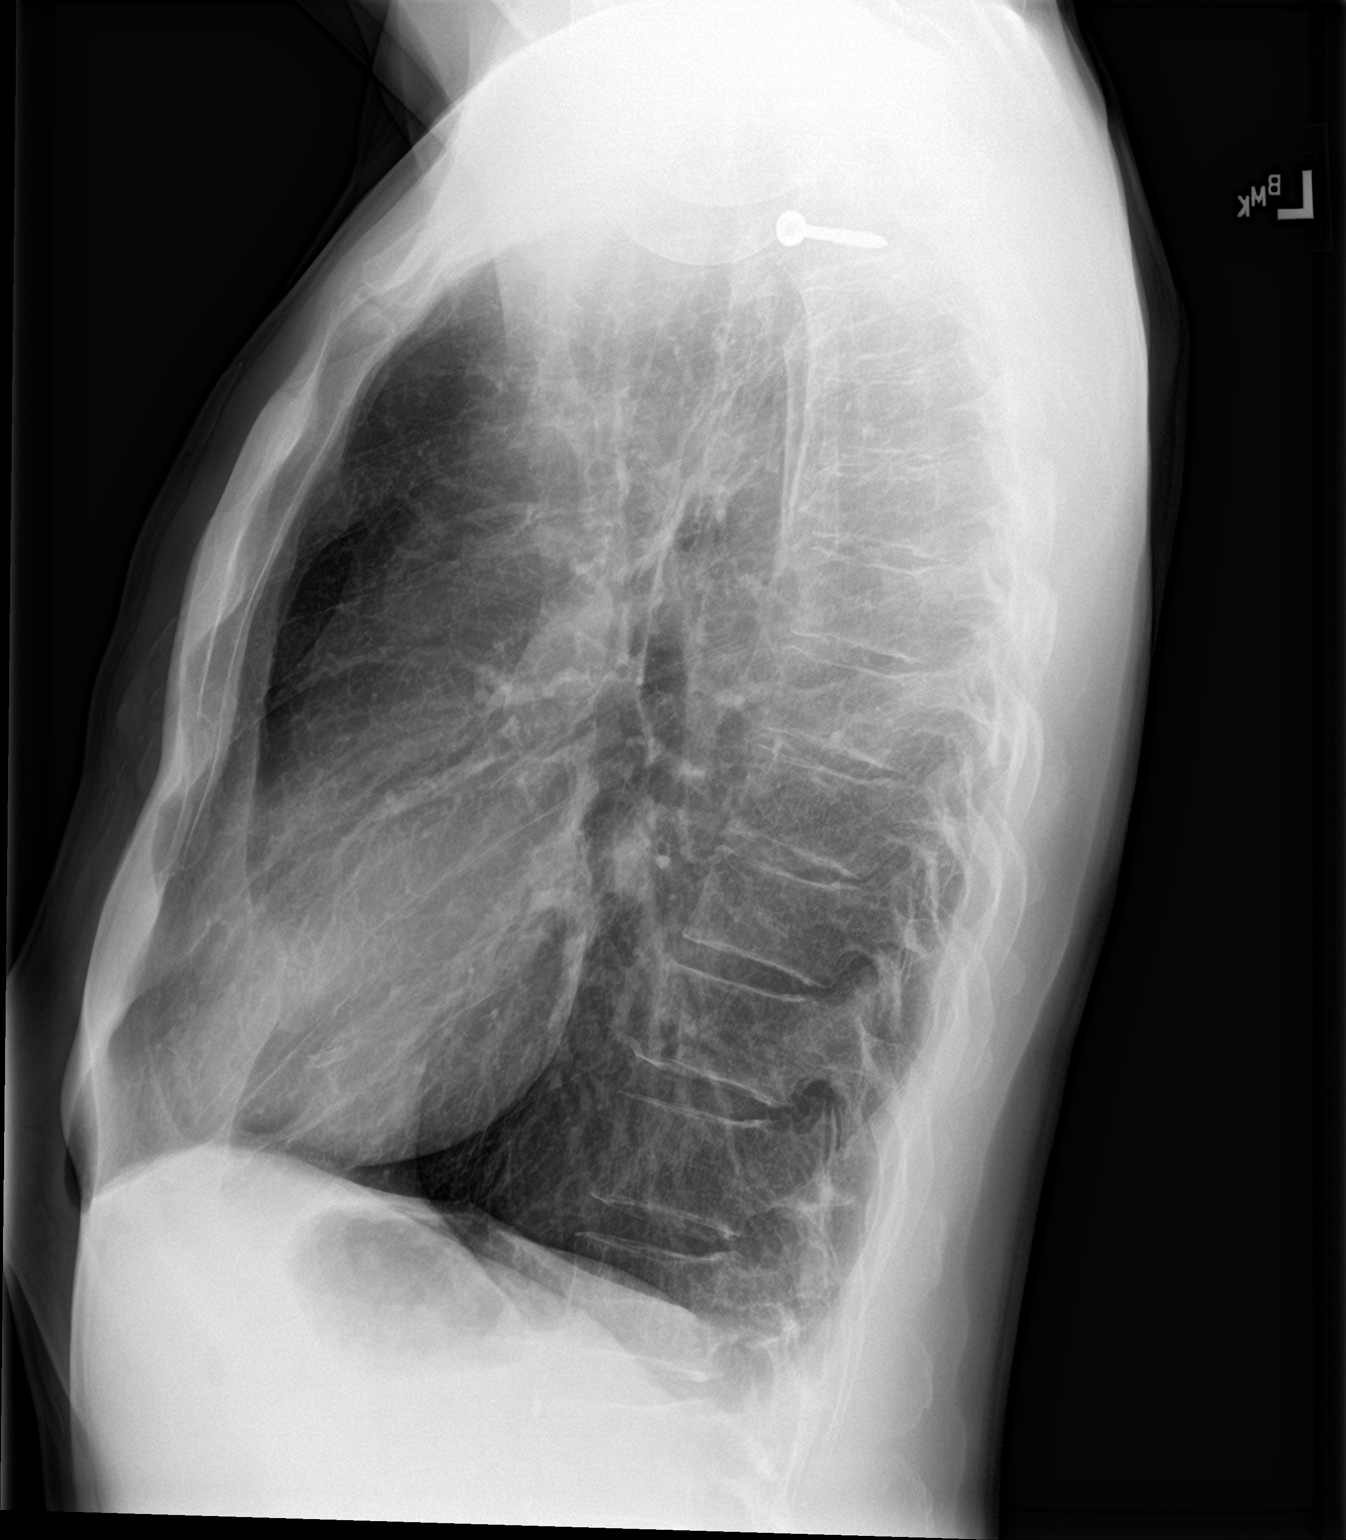

[2 of 2 positions shown; findings below may reference images not displayed]

FINDINGS: Hyperinflation. No acute consolidation or effusion. Biapical pleural
and parenchymal scarring. Stable cardiomediastinal silhouette. No
pneumothorax.
IMPRESSION: No active cardiopulmonary disease. Hyperinflation with emphysematous
changes.

## 2019-10-18 MED ORDER — PREDNISONE 20 MG PO TABS: 40.0000 mg | ORAL_TABLET | Freq: Every day | ORAL | 0 refills | Status: DC

## 2019-11-16 ENCOUNTER — Ambulatory Visit: Payer: BC Managed Care – PPO | Admitting: Internal Medicine

## 2019-11-16 ENCOUNTER — Encounter: Payer: Self-pay | Admitting: Internal Medicine

## 2019-11-16 ENCOUNTER — Other Ambulatory Visit: Payer: Self-pay

## 2019-11-16 DIAGNOSIS — L209 Atopic dermatitis, unspecified: Secondary | ICD-10-CM | POA: Diagnosis not present

## 2019-11-16 NOTE — Patient Instructions (Signed)
Please try over the counter fexofenadine 180mg  or cetirizine 10mg  daily at bedtime to help with the itching. Continue your creams for relief as well.

## 2019-11-16 NOTE — Progress Notes (Signed)
Subjective:    Patient ID: Ryan Cantrell, male    DOB: 26-Mar-1961, 59 y.o.   MRN: [id number]  HPI Here due to ongoing problems with eczema This visit occurred during the SARS-CoV-2 public health emergency.  Safety protocols were in place, including screening questions prior to the visit, additional usage of staff PPE, and extensive cleaning of exam room while observing appropriate contact time as indicated for disinfecting solutions.   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 acetaminophen (TYLENOL) 500 MG tablet Take 1,000 mg by mouth 2 (two) times daily as needed for mild pain or moderate pain.     Marland Kitchen atorvastatin (LIPITOR) 20 MG tablet Take 1 tablet (20 mg total) by mouth at bedtime. 90 tablet 2  . clopidogrel (PLAVIX) 75 MG tablet TAKE 1 TABLET BY MOUTH  DAILY 90 tablet 2  . isosorbide mononitrate (IMDUR) 30 MG 24 hr tablet Take 1 tablet (30 mg total) by mouth as needed. (Patient taking differently: Take 30 mg by mouth every other day. ) 30 tablet 6  . metoprolol succinate (TOPROL-XL) 25 MG 24 hr tablet TAKE 1 TABLET BY MOUTH  DAILY. 90 tablet 1  . nitroGLYCERIN (NITROSTAT) 0.4 MG SL tablet Place 1 tablet (0.4 mg total) under the tongue every 5 (five) minutes as needed for chest pain. 25 tablet 3  . triamcinolone cream (KENALOG) 0.1 % Apply 1  application topically 2 (two) times daily as needed. 45 g 1   No current facility-administered medications on file prior to visit.    Allergies  Allergen Reactions  . Codeine Sulfate Nausea And Vomiting    Past Medical History:  Diagnosis Date  . CAD S/P percutaneous coronary angioplasty: Inf STEMI - 100% dRCA - PCI Promus P 2.5 mm x 2mm (2.86mm) 05/29/2014   a. MI 05/29/14 dRCA PCI - Promus Premier DES 2.5 mm x 12 mm (2.8 mm); b. Echo: EF 55-60%, mild basal-inferior HK, normal DD  . COPD (chronic obstructive pulmonary disease) ([facility])     PFTs 2013.. Mod severe COPD - Emphysema  . ST elevation myocardial infarction (STEMI) of inferior wall, initial episode of care ([facility]) 05/29/2014   Echocardiogram 05/30/2014: EF 55-60%. Mild HK of the basal inferior wall. Normal valves    Past Surgical History:  Procedure Laterality Date  . Fracture left humerus  2009   no surgery. Torn rotator cuff also  . INGUINAL HERNIA REPAIR     twice on right and once on left-- 1980's-90's  . LEFT HEART CATH AND CORONARY ANGIOGRAPHY  05/29/2014   a. MI 05/29/14 dRCA 100%; D1 ~45%, Normal EF __> PCI to RCA  . NM MYOVIEW ([facility])  03/03/2018    LOW RISK. Small size, partially reversible apical defect - suggestive of scar & small peri-infarct ischemia.  EF 55-60%.   . PERCUTANEOUS CORONARY STENT INTERVENTION (  PCI-S)     dRCA - Promus Premie DES 2.5 mm x 12 mm (2.8 mm)  . Right shoulder surgery  1987  . TRANSTHORACIC ECHOCARDIOGRAM  03/03/2018   EF 55-60%.  No R WMA, normal diastolic parameters.  Normal valves.    Family History  Problem Relation Age of Onset  . Hypertension Mother   . Diabetes Mother   . Heart disease Mother        stent  . Cancer Father        prostate  . Hypertension Father   . Gout Father   . Heart disease Father        Stent  . Colon cancer Neg Hx   . Stomach cancer Neg Hx     Social History   Socioeconomic History  . Marital status: Married    Spouse name: Not on file  .  Number of children: 1  . Years of education: Not on file  . Highest education level: Not on file  Occupational History  . Occupation: Control and instrumentation engineer  Tobacco Use  . Smoking status: Former Smoker    Packs/day: 1.00    Types: E-cigarettes    Quit date: 05/29/2014    Years since quitting: 5.4  . Smokeless tobacco: Never Used  . Tobacco comment: occ e-cigarette--no cigarettes since MI  Substance and Sexual Activity  . Alcohol use: No    Alcohol/week: 0.0 standard drinks  . Drug use: No  . Sexual activity: Yes  Other Topics Concern  . Not on file  Social History Narrative   Married - 1 child.   Smokes 1/2 ppd.   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   Social Determinants of Health   Financial Resource Strain:   . Difficulty of Paying Living Expenses:   Food Insecurity:   . Worried About Programme researcher, broadcasting/film/video in the Last Year:   . Barista in the Last Year:   Transportation Needs:   . Freight forwarder (Medical):   Marland Kitchen Lack of Transportation (Non-Medical):   Physical Activity:   . Days of Exercise per Week:   . Minutes of Exercise per Session:   Stress:   . Feeling of Stress :   Social Connections:   . Frequency of Communication with Friends and Family:   . Frequency of Social Gatherings with Friends and Family:   . Attends Religious Services:   . Active Member of Clubs or Organizations:   . Attends Banker Meetings:   Marland Kitchen Marital Status:   Intimate Partner Violence:   . Fear of Current or Ex-Partner:   . Emotionally Abused:   Marland Kitchen Physically Abused:   . Sexually Abused:    Review of Systems  No fever Not sick     Objective:   Physical Exam  Constitutional: No distress.  Skin:  Red patch--left medial heel Slight areas on plantar feet Extensive mild rash on back and abdomen/chest           Assessment & Plan:

## 2019-11-16 NOTE — Assessment & Plan Note (Signed)
This has gone on for a long time I will refer to allergist---to see if there is an allergic focus that has not been identified Continue topical Rx Trial fexofenadine or cetirizine at night

## 2019-11-30 ENCOUNTER — Other Ambulatory Visit: Payer: Self-pay | Admitting: Cardiology

## 2020-02-07 ENCOUNTER — Encounter: Payer: Self-pay | Admitting: Internal Medicine

## 2020-02-07 ENCOUNTER — Ambulatory Visit (INDEPENDENT_AMBULATORY_CARE_PROVIDER_SITE_OTHER): Payer: BC Managed Care – PPO | Admitting: Internal Medicine

## 2020-02-07 ENCOUNTER — Other Ambulatory Visit: Payer: Self-pay

## 2020-02-07 ENCOUNTER — Encounter: Payer: 59 | Admitting: Internal Medicine

## 2020-02-07 VITALS — BP 138/86 | HR 81 | Temp 98.0°F | Ht 69.0 in | Wt 146.0 lb

## 2020-02-07 DIAGNOSIS — Z0000 Encounter for general adult medical examination without abnormal findings: Secondary | ICD-10-CM | POA: Diagnosis not present

## 2020-02-07 DIAGNOSIS — I2510 Atherosclerotic heart disease of native coronary artery without angina pectoris: Secondary | ICD-10-CM | POA: Diagnosis not present

## 2020-02-07 DIAGNOSIS — J439 Emphysema, unspecified: Secondary | ICD-10-CM | POA: Diagnosis not present

## 2020-02-07 MED ORDER — TRIAMCINOLONE ACETONIDE 0.1 % EX CREA
1.0000 | TOPICAL_CREAM | Freq: Two times a day (BID) | CUTANEOUS | 5 refills | Status: DC | PRN
Start: 2020-02-07 — End: 2020-02-11

## 2020-02-07 NOTE — Assessment & Plan Note (Addendum)
Healthy Urged him to cut back then stop the e-cigs Colon due 2023 Defer PSA to next year Discussed exercise Recommended the COVID vaccine even though he had it

## 2020-02-07 NOTE — Assessment & Plan Note (Signed)
Better off cigarettes Discussed the importance of stopping the e-cigs though

## 2020-02-07 NOTE — Assessment & Plan Note (Signed)
No angina off nitrates On metoprolol, plavix, statin

## 2020-02-07 NOTE — Progress Notes (Signed)
Subjective:    Patient ID: Ryan Cantrell, male    DOB: 05/09/1961, 59 y.o.   MRN: [id number]  HPI Here with wife for physical This visit occurred during the SARS-CoV-2 public health emergency.  Safety protocols were in place, including screening questions prior to the visit, additional usage of staff PPE, and extensive cleaning of exam room while observing appropriate contact time as indicated for disinfecting solutions.   Rash did get some better Didn't go to the allergist  The cream is helping though--flared a little lately  No other concerns Lots of pressure and a lot of work That stress might be exacerbate his eczema  No problems with heart Saw the cardiologist and imdur stopped  Current Outpatient Medications on File Prior to Visit  Medication Sig Dispense Refill  . acetaminophen (TYLENOL) 500 MG tablet Take 1,000 mg by mouth 2 (two) times daily as needed for mild pain or moderate pain.     Marland Kitchen atorvastatin (LIPITOR) 20 MG tablet Take 1 tablet (20 mg total) by mouth at bedtime. 90 tablet 2  . clopidogrel (PLAVIX) 75 MG tablet TAKE 1 TABLET BY MOUTH  DAILY 90 tablet 2  . metoprolol succinate (TOPROL-XL) 25 MG 24 hr tablet TAKE 1 TABLET BY MOUTH EVERY DAY 90 tablet 1  . nitroGLYCERIN (NITROSTAT) 0.4 MG SL tablet Place 1 tablet (0.4 mg total) under the tongue every 5 (five) minutes as needed for chest pain. 25 tablet 3  . triamcinolone cream (KENALOG) 0.1 % Apply 1 application topically 2 (two) times daily as needed. 45 g 1   No current facility-administered medications on file prior to visit.    Allergies  Allergen Reactions  . Codeine Sulfate Nausea And Vomiting    Past Medical History:  Diagnosis Date  . CAD S/P percutaneous coronary angioplasty: Inf STEMI - 100% dRCA - PCI Promus P 2.5 mm x 67mm (2.68mm) 05/29/2014   a. MI 05/29/14 dRCA PCI - Promus Premier DES 2.5 mm x 12 mm (2.8 mm); b. Echo: EF 55-60%, mild basal-inferior HK, normal DD  . COPD (chronic obstructive  pulmonary disease) ([facility])     PFTs 2013.. Mod severe COPD - Emphysema  . ST elevation myocardial infarction (STEMI) of inferior wall, initial episode of care ([facility]) 05/29/2014   Echocardiogram 05/30/2014: EF 55-60%. Mild HK of the basal inferior wall. Normal valves    Past Surgical History:  Procedure Laterality Date  . Fracture left humerus  2009   no surgery. Torn rotator cuff also  . INGUINAL HERNIA REPAIR     twice on right and once on left-- 1980's-90's  . LEFT HEART CATH AND CORONARY ANGIOGRAPHY  05/29/2014   a. MI 05/29/14 dRCA 100%; D1 ~45%, Normal EF __> PCI to RCA  . NM MYOVIEW ([facility])  03/03/2018    LOW RISK. Small size, partially reversible apical defect - suggestive of scar & small peri-infarct ischemia.  EF 55-60%.   . PERCUTANEOUS CORONARY STENT INTERVENTION (PCI-S)     dRCA - Promus Premie DES 2.5 mm x 12 mm (2.8 mm)  . Right shoulder surgery  1987  . TRANSTHORACIC ECHOCARDIOGRAM  03/03/2018   EF 55-60%.  No R WMA, normal diastolic parameters.  Normal valves.    Family History  Problem Relation Age of Onset  . Hypertension Mother   . Diabetes Mother   . Heart disease Mother        stent  . Cancer Father        prostate  .  Hypertension Father   . Gout Father   . Heart disease Father        Stent  . Colon cancer Neg Hx   . Stomach cancer Neg Hx     Social History   Socioeconomic History  . Marital status: Married    Spouse name: Not on file  . Number of children: 1  . Years of education: Not on file  . Highest education level: Not on file  Occupational History  . Occupation: Control and instrumentation engineer  Tobacco Use  . Smoking status: Former Smoker    Packs/day: 1.00    Types: E-cigarettes    Quit date: 05/29/2014    Years since quitting: 5.6  . Smokeless tobacco: Never Used  . Tobacco comment: occ e-cigarette--no cigarettes since MI  Substance and Sexual Activity  . Alcohol use: No    Alcohol/week: 0.0 standard drinks  . Drug use: No  . Sexual activity: Yes    Other Topics Concern  . Not on file  Social History Narrative   Married - 1 child.   Smokes 1/2 ppd.   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   Social Determinants of Health   Financial Resource Strain:   . Difficulty of Paying Living Expenses:   Food Insecurity:   . Worried About Programme researcher, broadcasting/film/video in the Last Year:   . Barista in the Last Year:   Transportation Needs:   . Freight forwarder (Medical):   Marland Kitchen Lack of Transportation (Non-Medical):   Physical Activity:   . Days of Exercise per Week:   . Minutes of Exercise per Session:   Stress:   . Feeling of Stress :   Social Connections:   . Frequency of Communication with Friends and Family:   . Frequency of Social Gatherings with Friends and Family:   . Attends Religious Services:   . Active Member of Clubs or Organizations:   . Attends Banker Meetings:   Marland Kitchen Marital Status:   Intimate Partner Violence:   . Fear of Current or Ex-Partner:   . Emotionally Abused:   Marland Kitchen Physically Abused:   . Sexually Abused:    Review of Systems  Constitutional: Negative for fatigue and unexpected weight change.       Wears seat belt  HENT: Positive for hearing loss. Negative for tinnitus.        All teeth removed---full dentures  Eyes: Negative for visual disturbance.       No diplopia or unilateral vision loss  Respiratory:       Only occasional cough Breathing is okay (since off the cigarettes and coronary stent)  Cardiovascular: Negative for chest pain, palpitations and leg swelling.  Gastrointestinal: Negative for blood in stool and constipation.       No heartburn  Endocrine: Negative for polydipsia and polyuria.  Genitourinary: Negative for difficulty urinating and urgency.       Some ED---not excited about trying medication  Musculoskeletal: Negative for arthralgias, back pain and joint swelling.  Skin: Positive for rash.       No suspicious skin lesions   Allergic/Immunologic: Positive for environmental allergies. Negative for immunocompromised state.       Very slight --no meds  Neurological: Negative for dizziness, syncope, light-headedness and headaches.  Hematological: Negative for adenopathy. Does not bruise/bleed easily.  Psychiatric/Behavioral: Negative for dysphoric mood and sleep disturbance. The patient is not nervous/anxious.        Objective:  Physical Exam  Constitutional: He is oriented to person, place, and time. He appears well-developed. No distress.  HENT:  Head: Normocephalic and atraumatic.  Right Ear: External ear normal.  Left Ear: External ear normal.  Mouth/Throat: Oropharynx is clear and moist. No oropharyngeal exudate.  Eyes: Pupils are equal, round, and reactive to light. Conjunctivae are normal.  Neck: No thyromegaly present.  Cardiovascular: Regular rhythm, normal heart sounds and intact distal pulses. Exam reveals no gallop.  No murmur heard. Mild bradycardia  Respiratory: Effort normal and breath sounds normal. No respiratory distress. He has no wheezes. He has no rales.  GI: Soft. There is no abdominal tenderness.  Musculoskeletal:        General: No tenderness or edema.  Lymphadenopathy:    He has no cervical adenopathy.  Neurological: He is alert and oriented to person, place, and time.  Skin: No rash noted. No erythema.  Multiple benign nevi--recommended derm evaluation (for routine)  Psychiatric: He has a normal mood and affect. His behavior is normal.           Assessment & Plan:

## 2020-02-08 LAB — CBC
HCT: 39.9 % (ref 39.0–52.0)
Hemoglobin: 13.6 g/dL (ref 13.0–17.0)
MCHC: 34.2 g/dL (ref 30.0–36.0)
MCV: 87 fl (ref 78.0–100.0)
Platelets: 309 10*3/uL (ref 150.0–400.0)
RBC: 4.59 Mil/uL (ref 4.22–5.81)
RDW: 12.9 % (ref 11.5–15.5)
WBC: 6.1 10*3/uL (ref 4.0–10.5)

## 2020-02-08 LAB — LIPID PANEL
Cholesterol: 99 mg/dL (ref 0–200)
HDL: 35.7 mg/dL — ABNORMAL LOW (ref >39.00)
LDL Cholesterol: 48 mg/dL (ref 0–99)
NonHDL: 63.35
Total CHOL/HDL Ratio: 3
Triglycerides: 77 mg/dL (ref 0.0–149.0)
VLDL: 15.4 mg/dL (ref 0.0–40.0)

## 2020-02-08 LAB — COMPREHENSIVE METABOLIC PANEL
ALT: 12 U/L (ref 0–53)
AST: 17 U/L (ref 0–37)
Albumin: 4.7 g/dL (ref 3.5–5.2)
Alkaline Phosphatase: 52 U/L (ref 39–117)
BUN: 8 mg/dL (ref 6–23)
CO2: 30 mEq/L (ref 19–32)
Calcium: 9 mg/dL (ref 8.4–10.5)
Chloride: 101 mEq/L (ref 96–112)
Creatinine, Ser: 0.87 mg/dL (ref 0.40–1.50)
GFR: 90.06 mL/min (ref >60.00)
Glucose, Bld: 94 mg/dL (ref 70–99)
Potassium: 3.9 mEq/L (ref 3.5–5.1)
Sodium: 136 mEq/L (ref 135–145)
Total Bilirubin: 1.7 mg/dL — ABNORMAL HIGH (ref 0.2–1.2)
Total Protein: 7.3 g/dL (ref 6.0–8.3)

## 2020-02-11 ENCOUNTER — Other Ambulatory Visit: Payer: Self-pay | Admitting: Internal Medicine

## 2020-03-01 ENCOUNTER — Other Ambulatory Visit: Payer: Self-pay | Admitting: Cardiology

## 2020-04-24 ENCOUNTER — Other Ambulatory Visit: Payer: Self-pay | Admitting: Cardiology

## 2020-06-04 ENCOUNTER — Other Ambulatory Visit: Payer: Self-pay | Admitting: Cardiology

## 2020-06-29 ENCOUNTER — Other Ambulatory Visit: Payer: BC Managed Care – PPO

## 2020-06-29 ENCOUNTER — Telehealth (INDEPENDENT_AMBULATORY_CARE_PROVIDER_SITE_OTHER): Payer: BC Managed Care – PPO | Admitting: Family Medicine

## 2020-06-29 ENCOUNTER — Encounter: Payer: Self-pay | Admitting: Family Medicine

## 2020-06-29 VITALS — Temp 97.8°F | Ht 69.0 in

## 2020-06-29 DIAGNOSIS — R059 Cough, unspecified: Secondary | ICD-10-CM

## 2020-06-29 DIAGNOSIS — Z20822 Contact with and (suspected) exposure to covid-19: Secondary | ICD-10-CM | POA: Diagnosis not present

## 2020-06-29 NOTE — Patient Instructions (Addendum)
Start flonase,2 sprays per nostril daily. Can also consider saline spray or irrigaiton.  Start Zyrtec at bedtime.  Start mucinex twice daily.  Recommend COVID testing if not feeling better, isolate until then.  Call if short of  breath or severe got To ER.  [facility] diagnostic testing: [phone]

## 2020-06-29 NOTE — Assessment & Plan Note (Addendum)
No clear COPD exacerbation, no clear bacterial infecitopn.  Likely COVID19  infection vs other viral infection. Allergies also a possibility.   Start Zyrtec, flonase and nasal saline. Mucinex BID.   Recommend testing .. info on how to obtain testing provided through MyChart. No SOB.  No red flags/need for ER visit or in-person exam at respiratory clinic at this time..    Pt moderate risk for COVID complications  given chronic respiratory issues, HTN.. candidate for MAB infusion if COVID positive. If SOB begins symptoms worsening.. have low threshold for in-person exam, if severe shortness of breath ER visit recommended.  Can monitor Oxygen saturation at home with home monitor if able to obtain.  Go to ER if O2 sat < 90% on room air.  Reviewed home care and provided information through MyChart.  Recommended isolation until test returns. If returns positive 10 days quarantine recommended.  Provided info about prevention of spread of COVID 19.

## 2020-06-29 NOTE — Progress Notes (Signed)
Work note written and sent to patient's MyChart as instructed by Dr. Bedsole. °

## 2020-06-29 NOTE — Progress Notes (Signed)
Chief Complaint  Patient presents with  . Nasal Congestion  . Cough    due to drainage  . Headache    History of Present Illness: Cough This is a new problem. The current episode started in the past 7 days (3 days). The problem has been gradually worsening (started after out in dusty conditions 2-3 days prior.). The cough is productive of sputum. Associated symptoms include headaches, nasal congestion and postnasal drip. Pertinent negatives include no chills, ear congestion, ear pain, fever, myalgias or sore throat. Associated symptoms comments: No diarrhea no loss of taste or smell, no sinus pain.  Headache  Associated symptoms include coughing. Pertinent negatives include no ear pain, fever or sore throat.    Hx of COPD, moderate severe/emphysema, former smoker  No issues with allergies.   taking ariborn with vit C, tylenol.   This visit occurred during the SARS-CoV-2 public health emergency.  Safety protocols were in place, including screening questions prior to the visit, additional usage of staff PPE, and extensive cleaning of exam room while observing appropriate contact time as indicated for disinfecting solutions.   COVID 19 screen:  No recent travel or known exposure to COVID19 NO COVID vaccines, S/P flu shot  Discussed COVID19 vaccine side effects and benefits. Strongly encouraged the patient to get the vaccine when feeling better. Questions answered. The importance of social distancing was discussed today.     Review of Systems  Constitutional: Negative for chills and fever.  HENT: Positive for postnasal drip. Negative for ear pain and sore throat.   Respiratory: Positive for cough.   Musculoskeletal: Negative for myalgias.  Neurological: Positive for headaches.      Past Medical History:  Diagnosis Date  . CAD S/P percutaneous coronary angioplasty: Inf STEMI - 100% dRCA - PCI Promus P 2.5 mm x 6mm (2.31mm) 05/29/2014   a. MI 05/29/14 dRCA PCI - Promus Premier DES  2.5 mm x 12 mm (2.8 mm); b. Echo: EF 55-60%, mild basal-inferior HK, normal DD  . COPD (chronic obstructive pulmonary disease) ([facility])     PFTs 2013.. Mod severe COPD - Emphysema  . ST elevation myocardial infarction (STEMI) of inferior wall, initial episode of care ([facility]) 05/29/2014   Echocardiogram 05/30/2014: EF 55-60%. Mild HK of the basal inferior wall. Normal valves    reports that he quit smoking about 6 years ago. His smoking use included e-cigarettes. He smoked 1.00 pack per day. He has never used smokeless tobacco. He reports that he does not drink alcohol and does not use drugs.   Current Outpatient Medications:  .  acetaminophen (TYLENOL) 500 MG tablet, Take 1,000 mg by mouth 2 (two) times daily as needed for mild pain or moderate pain. , Disp: , Rfl:  .  atorvastatin (LIPITOR) 20 MG tablet, TAKE 1 TABLET(20 MG) BY MOUTH AT BEDTIME, Disp: 90 tablet, Rfl: 2 .  clopidogrel (PLAVIX) 75 MG tablet, TAKE 1 TABLET BY MOUTH EVERY DAY, Disp: 90 tablet, Rfl: 2 .  metoprolol succinate (TOPROL-XL) 25 MG 24 hr tablet, TAKE 1 TABLET BY MOUTH EVERY DAY, Disp: 90 tablet, Rfl: 1 .  nitroGLYCERIN (NITROSTAT) 0.4 MG SL tablet, Place 1 tablet (0.4 mg total) under the tongue every 5 (five) minutes as needed for chest pain., Disp: 25 tablet, Rfl: 3 .  triamcinolone cream (KENALOG) 0.1 %, APPLY EXTERNALLY TO THE AFFECTED AREA TWICE DAILY AS NEEDED, Disp: 45 g, Rfl: 5   Observations/Objective: Temperature 97.8 F (36.6 C), temperature source Oral, height 5\' 9"  (1.753 m).  Physical Exam  Physical Exam Constitutional:      General: The patient is not in acute distress. Pulmonary:     Effort: Pulmonary effort is normal. No respiratory distress.  Neurological:     Mental Status: The patient is alert and oriented to person, place, and time.  Psychiatric:        Mood and Affect: Mood normal.        Behavior: Behavior normal.   Assessment and Plan   Cough No clear COPD exacerbation, no clear bacterial  infecitopn.  Likely COVID19  infection vs other viral infection. Allergies also a possibility.   Start Zyrtec, flonase and nasal saline. Mucinex BID.   Recommend testing .. info on how to obtain testing provided through MyChart. No SOB.  No red flags/need for ER visit or in-person exam at respiratory clinic at this time..    Pt moderate risk for COVID complications  given chronic respiratory issues, HTN.. candidate for MAB infusion if COVID positive. If SOB begins symptoms worsening.. have low threshold for in-person exam, if severe shortness of breath ER visit recommended.  Can monitor Oxygen saturation at home with home monitor if able to obtain.  Go to ER if O2 sat < 90% on room air.  Reviewed home care and provided information through MyChart.  Recommended isolation until test returns. If returns positive 10 days quarantine recommended.  Provided info about prevention of spread of COVID 19.      Kerby Nora, MD

## 2020-06-30 LAB — SARS-COV-2, NAA 2 DAY TAT

## 2020-06-30 LAB — NOVEL CORONAVIRUS, NAA: SARS-CoV-2, NAA: NOT DETECTED

## 2020-07-26 ENCOUNTER — Telehealth: Payer: Self-pay | Admitting: Internal Medicine

## 2020-07-26 NOTE — Telephone Encounter (Signed)
I left 2 messages on patient's voice mail with no response.  Patient needs to reschedule his  physical with Dr.Letvak on 02/08/21. I mailed reschedule letter to patient.

## 2020-09-11 ENCOUNTER — Encounter: Payer: Self-pay | Admitting: Cardiology

## 2020-09-11 ENCOUNTER — Other Ambulatory Visit: Payer: Self-pay

## 2020-09-11 ENCOUNTER — Ambulatory Visit: Payer: BC Managed Care – PPO | Admitting: Cardiology

## 2020-09-11 VITALS — BP 131/71 | HR 55 | Ht 70.0 in | Wt 147.0 lb

## 2020-09-11 DIAGNOSIS — E785 Hyperlipidemia, unspecified: Secondary | ICD-10-CM

## 2020-09-11 DIAGNOSIS — Z87891 Personal history of nicotine dependence: Secondary | ICD-10-CM

## 2020-09-11 DIAGNOSIS — I2510 Atherosclerotic heart disease of native coronary artery without angina pectoris: Secondary | ICD-10-CM | POA: Diagnosis not present

## 2020-09-11 DIAGNOSIS — R030 Elevated blood-pressure reading, without diagnosis of hypertension: Secondary | ICD-10-CM | POA: Diagnosis not present

## 2020-09-11 DIAGNOSIS — I2119 ST elevation (STEMI) myocardial infarction involving other coronary artery of inferior wall: Secondary | ICD-10-CM

## 2020-09-11 DIAGNOSIS — Z9861 Coronary angioplasty status: Secondary | ICD-10-CM | POA: Diagnosis not present

## 2020-09-11 DIAGNOSIS — N5201 Erectile dysfunction due to arterial insufficiency: Secondary | ICD-10-CM

## 2020-09-11 MED ORDER — METOPROLOL SUCCINATE ER 25 MG PO TB24: 25.0000 mg | ORAL_TABLET | Freq: Every day | ORAL | 3 refills | Status: DC

## 2020-09-11 MED ORDER — ATORVASTATIN CALCIUM 20 MG PO TABS: ORAL_TABLET | ORAL | 3 refills | Status: DC

## 2020-09-11 MED ORDER — CLOPIDOGREL BISULFATE 75 MG PO TABS: 75.0000 mg | ORAL_TABLET | Freq: Every day | ORAL | 3 refills | Status: DC

## 2020-09-11 MED ORDER — NITROGLYCERIN 0.4 MG SL SUBL: 0.4000 mg | SUBLINGUAL_TABLET | SUBLINGUAL | 3 refills | Status: DC | PRN

## 2020-09-11 NOTE — Patient Instructions (Signed)
Medication Instructions:  No changes  *If you need a refill on your cardiac medications before your next appointment, please call your pharmacy*   Lab Work:  not needed   Testing/Procedures: Not needed   Follow-Up: At [facility], you and your health needs are our priority.  As part of our continuing mission to provide you with exceptional heart care, we have created designated Provider Care Teams.  These Care Teams include your primary Cardiologist (physician) and Advanced Practice Providers (APPs -  Physician Assistants and Nurse Practitioners) who all work together to provide you with the care you need, when you need it.   Your next appointment:   12 month(s)  The format for your next appointment:   In Person  Provider:   Bryan Lemma, MD   Other Instructions Your physician discussed the hazards of e- cig  use. E- cig use cessation is recommended and techniques and options to help you quit were discussed.

## 2020-09-11 NOTE — Progress Notes (Signed)
Primary Care Provider: Karie Schwalbe, MD Cardiologist: Bryan Lemma, MD Electrophysiologist: None  Clinic Note: Chief Complaint  Patient presents with  . Coronary Artery Disease    No angina or heart failure  . Follow-up    1 year   =====================================  ASSESSMENT/PLAN   Problem List Items Addressed This Visit    ST elevation myocardial infarction (STEMI) of inferior wall, subsequent episode of care (HCC) - Primary (Chronic)    Over 6 years out from his MI at this point.  Doing quite well.  No recurrent anginal symptoms.  He has single DES stent in his RCA.  Preserved EF.  No recurrent angina.  He quit smoking at that time, although he has taken up e-cigarettes/vaping now with more frequency. Lipids and blood pressures have been well controlled.      Relevant Medications   atorvastatin (LIPITOR) 20 MG tablet   metoprolol succinate (TOPROL-XL) 25 MG 24 hr tablet   nitroGLYCERIN (NITROSTAT) 0.4 MG SL tablet   Other Relevant Orders   EKG 12-Lead (Completed)   CAD S/P percutaneous coronary angioplasty: Inf STEMI - 100% dRCA - PCI Promus P 2.5 mm x 30mm (2.63mm) (Chronic)    Remains on maintenance dose Plavix which can be held for procedures.  Okay to hold Plavix 5 days prior to surgeries or procedures.      Relevant Medications   atorvastatin (LIPITOR) 20 MG tablet   metoprolol succinate (TOPROL-XL) 25 MG 24 hr tablet   nitroGLYCERIN (NITROSTAT) 0.4 MG SL tablet   Other Relevant Orders   EKG 12-Lead (Completed)   Coronary artery disease involving native heart without angina pectoris (Chronic)    Single-vessel CAD in the setting of MI-DES PCI. Mild disease in the diagonal branch otherwise no significant CAD.  Doing well on Imdur with no further angina.  Plan: Continue stable regimen of rosuvastatin, low-dose Toprol and clopidogrel..      Relevant Medications   atorvastatin (LIPITOR) 20 MG tablet   metoprolol succinate (TOPROL-XL) 25 MG 24 hr  tablet   nitroGLYCERIN (NITROSTAT) 0.4 MG SL tablet   Former cigarette smoker (Chronic)    He is doing well he smokes station, but his his electronic cigarette/vaping to the point where he is doing quite a bit.  We talked about trying to cut down the amount he is doing and try to wean all the way off.      Borderline systolic hypertension (Chronic)    Blood pressure pretty well controlled on low dose Toprol.  Energy level doing well.      Erectile dysfunction (Chronic)    Not currently listed, but I have offered to refill his prescription for Viagra.      Hyperlipidemia with target LDL less than 70 (Chronic)    Last lipids were from June.  Excellent control with an LDL of 48 and total Vascepa 99 on 20 mg atorvastatin.  Tolerating well without any issues.  PCP is following labs.      Relevant Medications   atorvastatin (LIPITOR) 20 MG tablet   metoprolol succinate (TOPROL-XL) 25 MG 24 hr tablet   nitroGLYCERIN (NITROSTAT) 0.4 MG SL tablet   Other Relevant Orders   EKG 12-Lead (Completed)     =========================  HPI:    Ryan Cantrell is a 60 y.o. male with a PMH notable for CAD-PCI RCA in setting of Inferior STEMI (05/2014), moderate to severe COPD (former smoker having quit after MI) with chronic respecters of HTN and HLD who presents  today for annual follow-up.  Ryan SquiresMark A Cantrell was last seen on August 04, 2019 along with his wife who helps provide a lot of the history.  He had successfully recovered from COVID-19 infection and not having any significant untoward effect.  His dyspnea had improved.  No chest pain or heart failure symptoms.  Notes some mild exertional dyspnea from baseline COPD and rare skipped beats.  Mild insomnia-hard getting back to sleep after nocturia..  Recent Hospitalizations: None  Reviewed  CV studies:    The following studies were reviewed today: (if available, images/films reviewed: From Epic Chart or Care Everywhere)  None  Interval History:    Ryan Cantrell returns today overall doing very well.  He is very stable.  Quite active and energetic.  He is a little bit leery of getting the booster shot 1st COVID he went and got all shots but was not sure about getting the booster.  I encouraged him to proceed with doing it.  He continues to be active walking routinely.  He denies any chest tightness or pressure with rest or exertion.  He may get little bit short of breath if he rushes upstairs or carrying something upstairs/uphill.  No symptoms with regular activity.  He seems to be doing very well having weaned off the Imdur.  He has short little bursts of palpitations but nothing lasting more than a few seconds..  No CHF symptoms.  CV Review of Symptoms (Summary) : positive for - dyspnea on exertion, irregular heartbeat, palpitations and Relatively stable negative for - chest pain, edema, orthopnea, paroxysmal nocturnal dyspnea, rapid heart rate, shortness of breath or Lightheadedness or dizziness, syncope/near syncope or TIA/amaurosis fugax, claudication.  ED also improved.  The patient does have symptoms concerning for COVID-19 infection (fever, chills, cough, or new shortness of breath).   REVIEWED OF SYSTEMS   Review of Systems  Constitutional: Negative for malaise/fatigue and weight loss.  HENT: Negative for congestion and nosebleeds.   Respiratory: Positive for cough (Occasional, much improved). Negative for shortness of breath and wheezing.   Gastrointestinal: Negative for blood in stool, constipation and melena.  Genitourinary: Positive for frequency (Nocturia). Negative for hematuria.       Nocturia  Musculoskeletal: Negative for falls and joint pain.  Neurological: Negative for dizziness, focal weakness and headaches.  Psychiatric/Behavioral: Negative for depression and memory loss. The patient is not nervous/anxious and does not have insomnia (Test of the heart and lung back to sleep after waking up to urinate).    I  have reviewed and (if needed) personally updated the patient's problem list, medications, allergies, past medical and surgical history, social and family history.   PAST MEDICAL HISTORY   Past Medical History:  Diagnosis Date  . CAD S/P percutaneous coronary angioplasty: Inf STEMI - 100% dRCA - PCI Promus P 2.5 mm x 12mm (2.318mm) 05/29/2014   a. MI 05/29/14 dRCA PCI - Promus Premier DES 2.5 mm x 12 mm (2.8 mm); b. Echo: EF 55-60%, mild basal-inferior HK, normal DD  . COPD (chronic obstructive pulmonary disease) (HCC)     PFTs 2013.. Mod severe COPD - Emphysema  . ST elevation myocardial infarction (STEMI) of inferior wall, initial episode of care (HCC) 05/29/2014   Echocardiogram 05/30/2014: EF 55-60%. Mild HK of the basal inferior wall. Normal valves    PAST SURGICAL HISTORY   Past Surgical History:  Procedure Laterality Date  . Fracture left humerus  2009   no surgery. Torn rotator cuff also  .  INGUINAL HERNIA REPAIR     twice on right and once on left-- 1980's-90's  . LEFT HEART CATH AND CORONARY ANGIOGRAPHY  05/29/2014   a. MI 05/29/14 dRCA 100%; D1 ~45%, Normal EF __> PCI to RCA  . NM MYOVIEW ([facility] HX)  03/03/2018    LOW RISK. Small size, partially reversible apical defect - suggestive of scar & small peri-infarct ischemia.  EF 55-60%.   . PERCUTANEOUS CORONARY STENT INTERVENTION (PCI-S)     dRCA - Promus Premie DES 2.5 mm x 12 mm (2.8 mm)  . Right shoulder surgery  1987  . TRANSTHORACIC ECHOCARDIOGRAM  03/03/2018   EF 55-60%.  No R WMA, normal diastolic parameters.  Normal valves.    Immunization History  Administered Date(s) Administered  . Influenza Split 07/22/2012  . Influenza, Seasonal, Injecte, Preservative Fre 06/30/2015, 07/10/2016  . Influenza,inj,Quad PF,6+ Mos 06/09/2014  . Influenza,inj,quad, With Preservative 06/07/2020  . Influenza-Unspecified 06/02/2013, 06/02/2017, 06/11/2018, 06/08/2019  . Pneumococcal Conjugate-13 10/09/2016  . Pneumococcal  Polysaccharide-23 09/24/2013  . Td 09/03/2003, 01/14/2018  . Tdap 09/03/2007    MEDICATIONS/ALLERGIES   Current Meds  Medication Sig  . acetaminophen (TYLENOL) 500 MG tablet Take 1,000 mg by mouth 2 (two) times daily as needed for mild pain or moderate pain.   Marland Kitchen. triamcinolone cream (KENALOG) 0.1 % APPLY EXTERNALLY TO THE AFFECTED AREA TWICE DAILY AS NEEDED  . [DISCONTINUED] atorvastatin (LIPITOR) 20 MG tablet TAKE 1 TABLET(20 MG) BY MOUTH AT BEDTIME  . [DISCONTINUED] clopidogrel (PLAVIX) 75 MG tablet TAKE 1 TABLET BY MOUTH EVERY DAY  . [DISCONTINUED] metoprolol succinate (TOPROL-XL) 25 MG 24 hr tablet TAKE 1 TABLET BY MOUTH EVERY DAY  . [DISCONTINUED] nitroGLYCERIN (NITROSTAT) 0.4 MG SL tablet Place 1 tablet (0.4 mg total) under the tongue every 5 (five) minutes as needed for chest pain.    Allergies  Allergen Reactions  . Codeine Sulfate Nausea And Vomiting    SOCIAL HISTORY/FAMILY HISTORY   Reviewed in Epic:  Pertinent findings:  Social History   Tobacco Use  . Smoking status: Former Smoker    Packs/day: 1.00    Types: E-cigarettes    Quit date: 05/29/2014    Years since quitting: 6.3  . Smokeless tobacco: Never Used  . Tobacco comment: occ e-cigarette--no cigarettes since MI  Vaping Use  . Vaping Use: Every day  Substance Use Topics  . Alcohol use: No    Alcohol/week: 0.0 standard drinks  . Drug use: No   Social History   Social History Narrative   Married - 1 child.   Smokes 1/2 ppd.   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    OBJCTIVE -PE, EKG, labs   Wt Readings from Last 3 Encounters:  09/11/20 147 lb (66.7 kg)  02/07/20 146 lb (66.2 kg)  11/16/19 147 lb (66.7 kg)    Physical Exam: BP 131/71   Pulse (!) 55   Ht 5\' 10"  (1.778 m)   Wt 147 lb (66.7 kg)   SpO2 98%   BMI 21.09 kg/m  Physical Exam Vitals reviewed.  Constitutional:      General: He is not in acute distress.    Appearance: Normal appearance. He is  normal weight. He is not ill-appearing or toxic-appearing.     Comments: Well-groomed.  Healthy-appearing.  HENT:     Head: Normocephalic and atraumatic.     Comments: New dentures look great Neck:     Vascular: No carotid bruit, hepatojugular reflux or JVD.  Cardiovascular:     Rate and Rhythm: Regular rhythm. Bradycardia present. Occasional extrasystoles are present.    Chest Wall: PMI is not displaced.     Pulses: Normal pulses.     Heart sounds: Normal heart sounds. No murmur heard. No friction rub. No gallop.   Pulmonary:     Effort: Pulmonary effort is normal. No respiratory distress.     Breath sounds: Normal breath sounds.     Comments: He did have some initial induration sounds are clear with cough Chest:     Chest wall: No tenderness.  Musculoskeletal:        General: No swelling. Normal range of motion.     Cervical back: Normal range of motion and neck supple.  Skin:    General: Skin is warm and dry.  Neurological:     General: No focal deficit present.     Mental Status: He is alert and oriented to person, place, and time. Mental status is at baseline.     Motor: No weakness.     Gait: Gait normal.  Psychiatric:        Mood and Affect: Mood normal.        Thought Content: Thought content normal.        Judgment: Judgment normal.     Comments: He is still shy and timid      Adult ECG Report  Rate: 55;  Rhythm: sinus bradycardia and Nonspecific ST and T wave changes.  Otherwise normal axis, intervals and durations.;   Narrative Interpretation: Stable EKG.  Recent Labs: Reviewed Lab Results  Component Value Date   CHOL 99 02/07/2020   HDL 35.70 (L) 02/07/2020   LDLCALC 48 02/07/2020   TRIG 77.0 02/07/2020   CHOLHDL 3 02/07/2020   Lab Results  Component Value Date   CREATININE 0.87 02/07/2020   BUN 8 02/07/2020   NA 136 02/07/2020   K 3.9 02/07/2020   CL 101 02/07/2020   CO2 30 02/07/2020   CBC Latest Ref Rng & Units 02/07/2020 02/04/2019 06/03/2018   WBC 4.0 - 10.5 K/uL 6.1 5.8 6.2  Hemoglobin 13.0 - 17.0 g/dL 60.6 30.1 60.1  Hematocrit 39.0 - 52.0 % 39.9 40.1 42.2  Platelets 150.0 - 400.0 K/uL 309.0 302.0 373.0    Lab Results  Component Value Date   TSH 2.210 05/30/2014   ==============================================  COVID-19 Education: The signs and symptoms of COVID-19 were discussed with the patient and how to seek care for testing (follow up with PCP or arrange E-visit).   The importance of social distancing and COVID-19 vaccination was discussed today. 5 min The patient is practicing social distancing & Masking.   I spent a total of with the patient spent in direct patient consultation.  Additional time spent with chart review  / charting (studies, outside notes, etc): 12 min Total Time: 44 min   Current medicines are reviewed at length with the patient today.  (+/- concerns) n/a  This visit occurred during the SARS-CoV-2 public health emergency.  Safety protocols were in place, including screening questions prior to the visit, additional usage of staff PPE, and extensive cleaning of exam room while observing appropriate contact time as indicated for disinfecting solutions.  Notice: This dictation was prepared with Dragon dictation along with smaller phrase technology. Any transcriptional errors that result from this process are unintentional and may not be corrected upon review.  Patient Instructions / Medication Changes & Studies & Tests Ordered   Patient Instructions  Medication  Instructions:  No changes  *If you need a refill on your cardiac medications before your next appointment, please call your pharmacy*   Lab Work:  not needed   Testing/Procedures: Not needed   Follow-Up: At [facility], you and your health needs are our priority.  As part of our continuing mission to provide you with exceptional heart care, we have created designated Provider Care Teams.  These Care Teams include your  primary Cardiologist (physician) and Advanced Practice Providers (APPs -  Physician Assistants and Nurse Practitioners) who all work together to provide you with the care you need, when you need it.   Your next appointment:   12 month(s)  The format for your next appointment:   In Person  Provider:   Bryan Lemma, MD   Other Instructions Your physician discussed the hazards of e- cig  use. E- cig use cessation is recommended and techniques and options to help you quit were discussed.      Studies Ordered:   Orders Placed This Encounter  Procedures  . EKG 12-Lead     Bryan Lemma, M.D., M.S. Interventional Cardiologist   Pager # [phone] Phone # [phone] [address] [place], [place] [address]   Thank you for choosing Heartcare at [facility]!!

## 2020-09-19 ENCOUNTER — Encounter: Payer: Self-pay | Admitting: Cardiology

## 2020-09-19 DIAGNOSIS — I2510 Atherosclerotic heart disease of native coronary artery without angina pectoris: Secondary | ICD-10-CM | POA: Insufficient documentation

## 2020-09-19 NOTE — Assessment & Plan Note (Signed)
Over 6 years out from his MI at this point.  Doing quite well.  No recurrent anginal symptoms.  He has single DES stent in his RCA.  Preserved EF.  No recurrent angina.  He quit smoking at that time, although he has taken up e-cigarettes/vaping now with more frequency. Lipids and blood pressures have been well controlled.

## 2020-09-19 NOTE — Assessment & Plan Note (Signed)
He is doing well he smokes station, but his his electronic cigarette/vaping to the point where he is doing quite a bit.  We talked about trying to cut down the amount he is doing and try to wean all the way off.

## 2020-09-19 NOTE — Assessment & Plan Note (Signed)
Remains on maintenance dose Plavix which can be held for procedures.  Okay to hold Plavix 5 days prior to surgeries or procedures.

## 2020-09-19 NOTE — Assessment & Plan Note (Signed)
Blood pressure pretty well controlled on low dose Toprol.  Energy level doing well.

## 2020-09-19 NOTE — Assessment & Plan Note (Signed)
Last lipids were from June.  Excellent control with an LDL of 48 and total Vascepa 99 on 20 mg atorvastatin.  Tolerating well without any issues.  PCP is following labs.

## 2020-09-19 NOTE — Assessment & Plan Note (Addendum)
Single-vessel CAD in the setting of MI-DES PCI. Mild disease in the diagonal branch otherwise no significant CAD.  Doing well on Imdur with no further angina.  Plan: Continue stable regimen of rosuvastatin, low-dose Toprol and clopidogrel.Marland Kitchen

## 2020-09-19 NOTE — Assessment & Plan Note (Signed)
Not currently listed, but I have offered to refill his prescription for Viagra.

## 2020-10-29 DIAGNOSIS — Z20822 Contact with and (suspected) exposure to covid-19: Secondary | ICD-10-CM | POA: Diagnosis not present

## 2020-10-29 DIAGNOSIS — Z03818 Encounter for observation for suspected exposure to other biological agents ruled out: Secondary | ICD-10-CM | POA: Diagnosis not present

## 2021-02-08 ENCOUNTER — Encounter: Payer: BC Managed Care – PPO | Admitting: Internal Medicine

## 2021-02-22 ENCOUNTER — Ambulatory Visit (INDEPENDENT_AMBULATORY_CARE_PROVIDER_SITE_OTHER): Payer: BC Managed Care – PPO | Admitting: Internal Medicine

## 2021-02-22 ENCOUNTER — Encounter: Payer: Self-pay | Admitting: Internal Medicine

## 2021-02-22 ENCOUNTER — Other Ambulatory Visit: Payer: Self-pay

## 2021-02-22 VITALS — BP 130/72 | HR 71 | Temp 97.9°F | Ht 68.5 in | Wt 143.0 lb

## 2021-02-22 DIAGNOSIS — Z125 Encounter for screening for malignant neoplasm of prostate: Secondary | ICD-10-CM | POA: Diagnosis not present

## 2021-02-22 DIAGNOSIS — R1011 Right upper quadrant pain: Secondary | ICD-10-CM

## 2021-02-22 DIAGNOSIS — I2510 Atherosclerotic heart disease of native coronary artery without angina pectoris: Secondary | ICD-10-CM | POA: Diagnosis not present

## 2021-02-22 DIAGNOSIS — Z0000 Encounter for general adult medical examination without abnormal findings: Secondary | ICD-10-CM | POA: Diagnosis not present

## 2021-02-22 DIAGNOSIS — J439 Emphysema, unspecified: Secondary | ICD-10-CM

## 2021-02-22 MED ORDER — TADALAFIL 20 MG PO TABS: 10.0000 mg | ORAL_TABLET | ORAL | 11 refills | Status: DC | PRN

## 2021-02-22 NOTE — Progress Notes (Signed)
Subjective:    Patient ID: Ryan Cantrell, male    DOB: 1961-04-21, 60 y.o.   MRN: [id number]  HPI Here with wife for physical This visit occurred during the SARS-CoV-2 public health emergency.  Safety protocols were in place, including screening questions prior to the visit, additional usage of staff PPE, and extensive cleaning of exam room while observing appropriate contact time as indicated for disinfecting solutions.   Started with abdominal pain 2 days ago Across mid abdomen--worse first thing in AM (RUQ) then shifted to mid abdomen Might have been better after eating Having stress with work---thinks that may have caused it Went to urgent care--they were going to send him to ER --but decided not to go No fever Some constipation Did worry more since son had perforated appendix  No heart problems No set exercise--but physically active  Has cut back on e-cigs  Current Outpatient Medications on File Prior to Visit  Medication Sig Dispense Refill   acetaminophen (TYLENOL) 500 MG tablet Take 1,000 mg by mouth 2 (two) times daily as needed for mild pain or moderate pain.      atorvastatin (LIPITOR) 20 MG tablet TAKE 1 TABLET(20 MG) BY MOUTH AT BEDTIME 90 tablet 3   clopidogrel (PLAVIX) 75 MG tablet Take 1 tablet (75 mg total) by mouth daily. 90 tablet 3   metoprolol succinate (TOPROL-XL) 25 MG 24 hr tablet Take 1 tablet (25 mg total) by mouth daily. 90 tablet 3   nitroGLYCERIN (NITROSTAT) 0.4 MG SL tablet Place 1 tablet (0.4 mg total) under the tongue every 5 (five) minutes as needed for chest pain. 25 tablet 3   triamcinolone cream (KENALOG) 0.1 % APPLY EXTERNALLY TO THE AFFECTED AREA TWICE DAILY AS NEEDED 45 g 5   No current facility-administered medications on file prior to visit.    Allergies  Allergen Reactions   Codeine Sulfate Nausea And Vomiting    Past Medical History:  Diagnosis Date   CAD S/P percutaneous coronary angioplasty: Inf STEMI - 100% dRCA - PCI Promus P  2.5 mm x 75mm (2.39mm) 05/29/2014   a. MI 05/29/14 dRCA PCI - Promus Premier DES 2.5 mm x 12 mm (2.8 mm); b. Echo: EF 55-60%, mild basal-inferior HK, normal DD   COPD (chronic obstructive pulmonary disease) (HCC)     PFTs 2013.. Mod severe COPD - Emphysema   ST elevation myocardial infarction (STEMI) of inferior wall, initial episode of care (HCC) 05/29/2014   Echocardiogram 05/30/2014: EF 55-60%. Mild HK of the basal inferior wall. Normal valves    Past Surgical History:  Procedure Laterality Date   Fracture left humerus  2009   no surgery. Torn rotator cuff also   INGUINAL HERNIA REPAIR     twice on right and once on left-- 1980's-90's   LEFT HEART CATH AND CORONARY ANGIOGRAPHY  05/29/2014   a. MI 05/29/14 dRCA 100%; D1 ~45%, Normal EF __> PCI to RCA   NM MYOVIEW ([facility] HX)  03/03/2018    LOW RISK. Small size, partially reversible apical defect - suggestive of scar & small peri-infarct ischemia.  EF 55-60%.    PERCUTANEOUS CORONARY STENT INTERVENTION (PCI-S)     dRCA - Promus Premie DES 2.5 mm x 12 mm (2.8 mm)   Right shoulder surgery  1987   TRANSTHORACIC ECHOCARDIOGRAM  03/03/2018   EF 55-60%.  No R WMA, normal diastolic parameters.  Normal valves.    Family History  Problem Relation Age of Onset   Hypertension Mother  Diabetes Mother    Heart disease Mother        stent   Cancer Father        prostate   Hypertension Father    Gout Father    Heart disease Father        Stent   Colon cancer Neg Hx    Stomach cancer Neg Hx     Social History   Socioeconomic History   Marital status: Married    Spouse name: Not on file   Number of children: 1   Years of education: Not on file   Highest education level: Not on file  Occupational History   Occupation: Control and instrumentation engineer  Tobacco Use   Smoking status: Former    Packs/day: 1.00    Pack years: 0.00    Types: E-cigarettes, Cigarettes    Quit date: 05/29/2014    Years since quitting: 6.7   Smokeless tobacco: Never    Tobacco comments:    occ e-cigarette--no cigarettes since MI  Vaping Use   Vaping Use: Every day  Substance and Sexual Activity   Alcohol use: No    Alcohol/week: 0.0 standard drinks   Drug use: No   Sexual activity: Yes  Other Topics Concern   Not on file  Social History Narrative   Married - 1 child.   Smokes 1/2 ppd.   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   Social Determinants of Health   Financial Resource Strain: Not on file  Food Insecurity: Not on file  Transportation Needs: Not on file  Physical Activity: Not on file  Stress: Not on file  Social Connections: Not on file  Intimate Partner Violence: Not on file   Review of Systems  Constitutional:  Negative for fatigue and unexpected weight change.       Wears seat belt  HENT:  Negative for hearing loss and tinnitus.        Full dentures  Eyes:  Negative for visual disturbance.       No diplopia or unilateral vision loss  Respiratory:  Negative for cough, chest tightness and shortness of breath.   Cardiovascular:  Negative for chest pain, palpitations and leg swelling.  Gastrointestinal:  Positive for abdominal pain and constipation. Negative for blood in stool.        No heartburn   Endocrine: Negative for polydipsia and polyuria.  Genitourinary:  Negative for difficulty urinating and urgency.       ED--- he would like to try something  Musculoskeletal:  Positive for arthralgias. Negative for back pain and joint swelling.       Some thumb pain  Skin:  Negative for rash.       No suspicious skin lesions  Allergic/Immunologic: Negative for environmental allergies and immunocompromised state.  Neurological:  Negative for dizziness, syncope and light-headedness.       Occ headache---?caffeine  Hematological:  Negative for adenopathy. Bruises/bleeds easily.  Psychiatric/Behavioral:  Negative for dysphoric mood and sleep disturbance. The patient is nervous/anxious.       Objective:    Physical Exam Constitutional:      Appearance: He is well-developed.  HENT:     Right Ear: Tympanic membrane and ear canal normal.     Left Ear: Tympanic membrane and ear canal normal.     Mouth/Throat:     Pharynx: No pharyngeal swelling, oropharyngeal exudate or posterior oropharyngeal erythema.  Eyes:     Conjunctiva/sclera: Conjunctivae normal.  Pupils: Pupils are equal, round, and reactive to light.  Cardiovascular:     Rate and Rhythm: Normal rate and regular rhythm.     Pulses: Normal pulses.     Heart sounds: No murmur heard.   No gallop.  Pulmonary:     Effort: Pulmonary effort is normal.     Breath sounds: Normal breath sounds. No wheezing or rales.  Abdominal:     Palpations: Abdomen is soft. There is no mass.     Tenderness: There is no abdominal tenderness. There is no guarding.  Musculoskeletal:     Cervical back: Neck supple.     Right lower leg: No edema.     Left lower leg: No edema.  Lymphadenopathy:     Cervical: No cervical adenopathy.  Skin:    General: Skin is warm.     Findings: No rash.  Neurological:     General: No focal deficit present.     Mental Status: He is alert.  Psychiatric:        Mood and Affect: Mood normal.        Behavior: Behavior normal.           Assessment & Plan:

## 2021-02-22 NOTE — Assessment & Plan Note (Signed)
Healthy Colon due 2023 Will check PSA Urged him to get COVID vaccine Flu vaccine in the fall Recommended shingrix--he is not ready for this

## 2021-02-22 NOTE — Assessment & Plan Note (Signed)
No symptoms and never needed nitro Statin, plavix, metoprolol

## 2021-02-22 NOTE — Assessment & Plan Note (Signed)
Urged him to stop the e-cigs---he has cut down

## 2021-02-22 NOTE — Assessment & Plan Note (Signed)
Transient Likely from stress If recurs, will check ultrasound

## 2021-02-23 ENCOUNTER — Other Ambulatory Visit: Payer: Self-pay | Admitting: Internal Medicine

## 2021-02-23 DIAGNOSIS — R972 Elevated prostate specific antigen [PSA]: Secondary | ICD-10-CM

## 2021-02-23 LAB — LIPID PANEL
Cholesterol: 102 mg/dL (ref 0–200)
HDL: 36.8 mg/dL — ABNORMAL LOW (ref >39.00)
LDL Cholesterol: 50 mg/dL (ref 0–99)
NonHDL: 65.57
Total CHOL/HDL Ratio: 3
Triglycerides: 76 mg/dL (ref 0.0–149.0)
VLDL: 15.2 mg/dL (ref 0.0–40.0)

## 2021-02-23 LAB — COMPREHENSIVE METABOLIC PANEL
ALT: 12 U/L (ref 0–53)
AST: 18 U/L (ref 0–37)
Albumin: 4.9 g/dL (ref 3.5–5.2)
Alkaline Phosphatase: 66 U/L (ref 39–117)
BUN: 10 mg/dL (ref 6–23)
CO2: 30 mEq/L (ref 19–32)
Calcium: 9.7 mg/dL (ref 8.4–10.5)
Chloride: 95 mEq/L — ABNORMAL LOW (ref 96–112)
Creatinine, Ser: 0.92 mg/dL (ref 0.40–1.50)
GFR: 91.21 mL/min (ref >60.00)
Glucose, Bld: 92 mg/dL (ref 70–99)
Potassium: 4.1 mEq/L (ref 3.5–5.1)
Sodium: 134 mEq/L — ABNORMAL LOW (ref 135–145)
Total Bilirubin: 2.3 mg/dL — ABNORMAL HIGH (ref 0.2–1.2)
Total Protein: 7.8 g/dL (ref 6.0–8.3)

## 2021-02-23 LAB — PSA: PSA: 5.24 ng/mL — ABNORMAL HIGH (ref 0.10–4.00)

## 2021-02-23 LAB — CBC
HCT: 41 % (ref 39.0–52.0)
Hemoglobin: 14.3 g/dL (ref 13.0–17.0)
MCHC: 34.8 g/dL (ref 30.0–36.0)
MCV: 85.2 fl (ref 78.0–100.0)
Platelets: 345 10*3/uL (ref 150.0–400.0)
RBC: 4.81 Mil/uL (ref 4.22–5.81)
RDW: 13.1 % (ref 11.5–15.5)
WBC: 6.3 10*3/uL (ref 4.0–10.5)

## 2021-02-26 ENCOUNTER — Telehealth: Payer: Self-pay

## 2021-02-26 NOTE — Telephone Encounter (Signed)
Please set up labs draw for PSA in about 3 months

## 2021-05-24 ENCOUNTER — Other Ambulatory Visit: Payer: BC Managed Care – PPO

## 2021-05-24 ENCOUNTER — Other Ambulatory Visit (INDEPENDENT_AMBULATORY_CARE_PROVIDER_SITE_OTHER): Payer: BC Managed Care – PPO

## 2021-05-24 ENCOUNTER — Other Ambulatory Visit: Payer: Self-pay

## 2021-05-24 DIAGNOSIS — R972 Elevated prostate specific antigen [PSA]: Secondary | ICD-10-CM | POA: Diagnosis not present

## 2021-05-25 LAB — PSA, TOTAL AND FREE
PSA, % Free: 14 % (calc) — ABNORMAL LOW (ref >25)
PSA, Free: 0.6 ng/mL
PSA, Total: 4.3 ng/mL — ABNORMAL HIGH (ref <=4.0)

## 2021-06-27 MED ORDER — BENZONATATE 200 MG PO CAPS: 200.0000 mg | ORAL_CAPSULE | Freq: Three times a day (TID) | ORAL | 0 refills | Status: DC | PRN

## 2021-07-31 DIAGNOSIS — M791 Myalgia, unspecified site: Secondary | ICD-10-CM | POA: Diagnosis not present

## 2021-07-31 DIAGNOSIS — Z20822 Contact with and (suspected) exposure to covid-19: Secondary | ICD-10-CM | POA: Diagnosis not present

## 2021-07-31 DIAGNOSIS — J111 Influenza due to unidentified influenza virus with other respiratory manifestations: Secondary | ICD-10-CM | POA: Diagnosis not present

## 2021-11-04 ENCOUNTER — Other Ambulatory Visit: Payer: Self-pay | Admitting: Cardiology

## 2021-12-09 ENCOUNTER — Other Ambulatory Visit: Payer: Self-pay | Admitting: Cardiology

## 2021-12-10 ENCOUNTER — Other Ambulatory Visit: Payer: Self-pay | Admitting: Cardiology

## 2021-12-18 ENCOUNTER — Encounter: Payer: Self-pay | Admitting: Cardiology

## 2021-12-18 ENCOUNTER — Ambulatory Visit: Payer: BC Managed Care – PPO | Admitting: Cardiology

## 2021-12-18 VITALS — BP 150/78 | HR 63 | Ht 70.0 in | Wt 146.0 lb

## 2021-12-18 DIAGNOSIS — I2510 Atherosclerotic heart disease of native coronary artery without angina pectoris: Secondary | ICD-10-CM | POA: Diagnosis not present

## 2021-12-18 DIAGNOSIS — E785 Hyperlipidemia, unspecified: Secondary | ICD-10-CM

## 2021-12-18 DIAGNOSIS — I2119 ST elevation (STEMI) myocardial infarction involving other coronary artery of inferior wall: Secondary | ICD-10-CM

## 2021-12-18 DIAGNOSIS — J439 Emphysema, unspecified: Secondary | ICD-10-CM | POA: Diagnosis not present

## 2021-12-18 DIAGNOSIS — Z87891 Personal history of nicotine dependence: Secondary | ICD-10-CM

## 2021-12-18 DIAGNOSIS — R030 Elevated blood-pressure reading, without diagnosis of hypertension: Secondary | ICD-10-CM | POA: Diagnosis not present

## 2021-12-18 DIAGNOSIS — Z9861 Coronary angioplasty status: Secondary | ICD-10-CM

## 2021-12-18 MED ORDER — METOPROLOL SUCCINATE ER 25 MG PO TB24
ORAL_TABLET | ORAL | 3 refills | Status: DC
Start: 2021-12-18 — End: 2022-12-31

## 2021-12-18 MED ORDER — CLOPIDOGREL BISULFATE 75 MG PO TABS: ORAL_TABLET | ORAL | 3 refills | Status: DC

## 2021-12-18 MED ORDER — NITROGLYCERIN 0.4 MG SL SUBL: 0.4000 mg | SUBLINGUAL_TABLET | SUBLINGUAL | 5 refills | Status: DC | PRN

## 2021-12-18 NOTE — Patient Instructions (Addendum)
Medication Instructions:  ?NO CHANGES ? ? ?*If you need a refill on your cardiac medications before your next appointment, please call your pharmacy* ? ? ?Lab Work: ?NOT NEEDED ?If you have labs (blood work) drawn today and your tests are completely normal, you will receive your results only by: ?MyChart Message (if you have MyChart) OR ?A paper copy in the mail ?If you have any lab test that is abnormal or we need to change your treatment, we will call you to review the results. ? ? ?Testing/Procedures: ? ?NOT NEEDED ? ?Follow-Up: ?At [facility], you and your health needs are our priority.  As part of our continuing mission to provide you with exceptional heart care, we have created designated Provider Care Teams.  These Care Teams include your primary Cardiologist (physician) and Advanced Practice Providers (APPs -  Physician Assistants and Nurse Practitioners) who all work together to provide you with the care you need, when you need it. ? ?  ? ?Your next appointment:   ?12 month(s) ? ?The format for your next appointment:   ?In Person ? ?Provider:   ?Bryan Lemma, MD  ? ? ?Other Instructions  ? ?ARM -BLOOD PRESSURE - [facility]ON  ?

## 2021-12-18 NOTE — Progress Notes (Signed)
Primary Care Provider: Karie Schwalbe, MD Cardiologist: Bryan Lemma, MD Electrophysiologist: None  Clinic Note: Chief Complaint  Patient presents with   Follow-up    Doing fairly well.  Wife is concerned that he is still doing e-cigarettes at least 1 carton a day. Also notes that when he is on his feet all day long he feels very tired at the end of the day   Coronary Artery Disease    No anginal or heart failure symptoms.    ===================================  ASSESSMENT/PLAN   Problem List Items Addressed This Visit       Cardiology Problems   ST elevation myocardial infarction (STEMI) of inferior wall, subsequent episode of care [facility]) - Primary (Chronic)    Just about 8 years out from his MI.  Doing remarkably well.  Has not had any further anginal symptoms or heart failure symptoms. Myoview back in 2019 showed a small size partially reversible defect consistent with prior scar.  Normal EF.  In the absence of any active symptoms, would not do any more ischemic evaluation.  Plan: Maintenance therapy with low-dose Toprol along with moderate dose atorvastatin and Plavix daily.       Relevant Medications   metoprolol succinate (TOPROL-XL) 25 MG 24 hr tablet   nitroGLYCERIN (NITROSTAT) 0.4 MG SL tablet   Other Relevant Orders   EKG 12-Lead   CAD S/P percutaneous coronary angioplasty: Inf STEMI - 100% dRCA - PCI Promus P 2.5 mm x 12mm (2.34mm) (Chronic)    PCI of the RCA with maintenance dose Plavix as opposed to aspirin. `Okay to hold Plavix 5 to 7 days preop for surgical procedures. No need for ischemic evaluation in the absence of symptoms.      Relevant Medications   metoprolol succinate (TOPROL-XL) 25 MG 24 hr tablet   nitroGLYCERIN (NITROSTAT) 0.4 MG SL tablet   Other Relevant Orders   EKG 12-Lead   Coronary artery disease involving native heart without angina pectoris (Chronic)    No anginal symptoms.  Doing very well.  He is on minimal therapy with  low-dose Toprol, intermediate dose atorvastatin and maintenance dose clopidogrel.       Relevant Medications   metoprolol succinate (TOPROL-XL) 25 MG 24 hr tablet   nitroGLYCERIN (NITROSTAT) 0.4 MG SL tablet   Other Relevant Orders   EKG 12-Lead   Hyperlipidemia with target LDL less than 70 (Chronic)    Labs were checked last June.  Due for lab check in another month or so.  [facility] see him after the labs are checked. Most recent labs showed LDL of 50 on current dose of atorvastatin.  He had been on Vascepa but which is no longer listed as a medication.  Need to ensure that he is not developing worsening hypertriglyceridemia.       Relevant Medications   metoprolol succinate (TOPROL-XL) 25 MG 24 hr tablet   nitroGLYCERIN (NITROSTAT) 0.4 MG SL tablet     Other   Former cigarette smoker (Chronic)    Did well with smoking cessation, but is now much addicted to e-cigarettes.  Needs to finally make a break from e-cigarettes as well.  He is to wean off       Borderline systolic hypertension (Chronic)    Blood pressure is actually high today which is unusual for him He has been on low-dose Toprol-unable to titrate in the past. Continue Toprol 25 mg daily with low threshold to add ACE inhibitor/ARB if pressures continue to be  elevated.       Pulmonary emphysema (HCC) (Chronic)    Contributes to his dyspnea.  Still using e-cigarettes which probably does not have nearly the amount of damage to affect of the lungs, but still needs to work on quitting so he can probably be free of tobacco/nicotine.        ===================================  HPI:    Ryan Cantrell is a 61 y.o. male with a PMH below who presents today for annual follow-up of CAD and risk factors.Ryan Cantrell He returns today at the request of Karie Schwalbe, MD.  PMH notable for : CAD-PCI RCA in setting of Inferior STEMI (05/2014),  Moderate to Severe COPD (former smoker having quit after MI-uses e-cigarettes) CRF's:  Former smoker, HTN and HLD who presents today for annual follow-up. Poor Dentition (now status post most of his teeth removal with dentures.  Long process)  Ryan Cantrell was last seen in Jan 2022: Doing well. Stable. Energetic.  Continued Exercise - walking routinely.  NO CP or DOE. DOE carrying heavy items upstairs only.  Short bursts of palpitations.  - NO changes.   Recent Hospitalizations: none  Covid + Oct 2022.   Reviewed  CV studies:    The following studies were reviewed today: (if available, images/films reviewed: From Epic Chart or Care Everywhere) none:   Interval History:   Ryan Cantrell returns here today for annual follow-up pretty much doing well from his standpoint.  He does get tired when he is on his feet all day long at work.  He gets some swelling in his ankles but once he is able to sit down and rest he feels much better.  He denies any chest pain or pressure with rest or exertion.  He does have some mild exertional dyspnea related to his COPD but that is pretty stable.  No major issues with any recent COPD exacerbations.  Being very careful to try to avoid catching illnesses.  His wife is concerned, because he is now using about a carton a day of cigarettes which is almost more of a crutch for him.  He has not necessarily been to vaping.  He is thinking he probably needs to cut back, but is just trying to get himself in the right state of mind.  He has been really busy with work and so he has not had ability to exercise much as he used to, so he has some deconditioning and does not the same exercise endurance he used to have.  His weights have been stable.  His BPs at home and usually pretty well controlled.  Today's pressure is quite high.  CV Review of Symptoms (Summary) Cardiovascular ROS: positive for - dyspnea on exertion, edema, and rare palpitations.  End of day fatigue. negative for - chest pain, edema, irregular heartbeat, palpitations, paroxysmal nocturnal  dyspnea, rapid heart rate, or lightheadedness or dizziness unless he has a coughing spell, syncope/near syncope or TIA/amaurosis fugax, claudication; ED is also improved some.  REVIEWED OF SYSTEMS   Review of Systems  Constitutional:  Negative for malaise/fatigue (Just fatigue at the end of the day.) and weight loss.  HENT:  Negative for congestion and nosebleeds.   Respiratory:  Positive for cough (Off-and-on, occasional cough much better than this morning smoker's cough.). Negative for wheezing.   Cardiovascular:  Negative for chest pain, palpitations and orthopnea.  Gastrointestinal:  Negative for blood in stool, constipation and melena.  Genitourinary:  Positive for frequency (.  Mostly nocturnal frequency).  Negative for dysuria, hematuria and urgency.  Musculoskeletal:  Positive for joint pain. Negative for back pain and myalgias.  Neurological:  Positive for headaches (Off-and-on). Negative for dizziness, focal weakness and weakness.  Psychiatric/Behavioral:  Negative for depression and memory loss. The patient is nervous/anxious (Every now and has an anxious attacks.). The patient does not have insomnia.    I have reviewed and (if needed) personally updated the patient's problem list, medications, allergies, past medical and surgical history, social and family history.   PAST MEDICAL HISTORY   Past Medical History:  Diagnosis Date   CAD S/P percutaneous coronary angioplasty: Inf STEMI - 100% dRCA - PCI Promus P 2.5 mm x 65mm (2.68mm) 05/29/2014   a. MI 05/29/14 dRCA PCI - Promus Premier DES 2.5 mm x 12 mm (2.8 mm); b. Echo: EF 55-60%, mild basal-inferior HK, normal DD   COPD (chronic obstructive pulmonary disease) (HCC)     PFTs 2013.. Mod severe COPD - Emphysema   ST elevation myocardial infarction (STEMI) of inferior wall, initial episode of care (HCC) 05/29/2014   Echocardiogram 05/30/2014: EF 55-60%. Mild HK of the basal inferior wall. Normal valves    PAST SURGICAL HISTORY    Past Surgical History:  Procedure Laterality Date   Fracture left humerus  2009   no surgery. Torn rotator cuff also   INGUINAL HERNIA REPAIR     twice on right and once on left-- 1980's-90's   LEFT HEART CATH AND CORONARY ANGIOGRAPHY  05/29/2014   a. MI 05/29/14 dRCA 100%; D1 ~45%, Normal EF __> PCI to RCA   NM MYOVIEW ([facility] HX)  03/03/2018    LOW RISK. Small size, partially reversible apical defect - suggestive of scar & small peri-infarct ischemia.  EF 55-60%.    PERCUTANEOUS CORONARY STENT INTERVENTION (PCI-S)     dRCA - Promus Premie DES 2.5 mm x 12 mm (2.8 mm)   Right shoulder surgery  1987   TRANSTHORACIC ECHOCARDIOGRAM  03/03/2018   EF 55-60%.  No R WMA, normal diastolic parameters.  Normal valves.    Immunization History  Administered Date(s) Administered   Influenza Split 07/22/2012   Influenza, Seasonal, Injecte, Preservative Fre 06/30/2015, 07/10/2016   Influenza,inj,Quad PF,6+ Mos 06/09/2014   Influenza,inj,quad, With Preservative 06/07/2020   Influenza-Unspecified 06/02/2013, 06/02/2017, 06/11/2018, 06/08/2019   Pneumococcal Conjugate-13 10/09/2016   Pneumococcal Polysaccharide-23 09/24/2013   Td 09/03/2003, 01/14/2018   Tdap 09/03/2007    MEDICATIONS/ALLERGIES   Current Meds  Medication Sig   acetaminophen (TYLENOL) 500 MG tablet Take 1,000 mg by mouth 2 (two) times daily as needed for mild pain or moderate pain.    atorvastatin (LIPITOR) 20 MG tablet TAKE 1 TABLET(20 MG) BY MOUTH AT BEDTIME   nitroGLYCERIN (NITROSTAT) 0.4 MG SL tablet Place 1 tablet (0.4 mg total) under the tongue every 5 (five) minutes as needed for chest pain.   triamcinolone cream (KENALOG) 0.1 % APPLY EXTERNALLY TO THE AFFECTED AREA TWICE DAILY AS NEEDED   []  clopidogrel (PLAVIX) 75 MG tablet TAKE 1 TABLET(75 MG) BY MOUTH DAILY   []  metoprolol succinate (TOPROL-XL) 25 MG 24 hr tablet TAKE 1 TABLET(25 MG) BY MOUTH DAILY    Allergies  Allergen Reactions   Codeine Sulfate Nausea And  Vomiting    SOCIAL HISTORY/FAMILY HISTORY   Reviewed in Epic:  Pertinent findings:  Social History   Tobacco Use   Smoking status: Former    Packs/day: 1.00    Types: E-cigarettes, Cigarettes    Quit date: 05/29/2014  Years since quitting: 7.6   Smokeless tobacco: Never   Tobacco comments:    1 carton a day of e-cigarette--no cigarettes since MI  Vaping Use   Vaping Use: Every day   Start date: 06/02/2014  Substance Use Topics   Alcohol use: No    Alcohol/week: 0.0 standard drinks   Drug use: No   Social History   Social History Narrative   Married - 1 child.   Smokes 1/2 ppd.   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    OBJCTIVE -PE, EKG, labs   Wt Readings from Last 3 Encounters:  12/18/21 146 lb (66.2 kg)  02/22/21 143 lb (64.9 kg)  09/11/20 147 lb (66.7 kg)    Physical Exam: BP (!) 150/78   Pulse 63   Ht 5\' 10"  (1.778 m)   Wt 146 lb (66.2 kg)   SpO2 97%   BMI 20.95 kg/m  -> BP tends to be better at home, but have not been checking it routinely. Physical Exam Vitals reviewed.  Constitutional:      General: He is not in acute distress.    Appearance: Normal appearance. He is normal weight. He is not ill-appearing or toxic-appearing.  HENT:     Head: Normocephalic and atraumatic.  Neck:     Vascular: No carotid bruit or JVD.  Cardiovascular:     Rate and Rhythm: Normal rate and regular rhythm. Occasional Extrasystoles are present.    Chest Wall: PMI is not displaced.     Pulses: Intact distal pulses. Decreased pulses (Mildly decreased pedal pulses.).     Heart sounds: No murmur heard.   No friction rub. No gallop.     Comments: Normal S1 and split S2 Pulmonary:     Effort: Pulmonary effort is normal. No respiratory distress.     Breath sounds: Normal breath sounds. No wheezing, rhonchi or rales.  Musculoskeletal:        General: No swelling. Normal range of motion.     Cervical back: Normal range of motion and neck  supple.  Skin:    General: Skin is warm and dry.     Coloration: Skin is not jaundiced or pale.  Neurological:     General: No focal deficit present.     Mental Status: He is alert and oriented to person, place, and time. Mental status is at baseline.     Gait: Gait normal.  Psychiatric:        Mood and Affect: Mood normal.        Behavior: Behavior normal.        Thought Content: Thought content normal.        Judgment: Judgment normal.    Adult ECG Report  Rate: 63 ;  Rhythm: normal sinus rhythm and RBBB/IVCD with repolarization changes.  Otherwise normal axis, intervals durations.. ;   Narrative Interpretation: Stable  Recent Labs: Reviewed.  Is due for labs to be checked by PCP soon. Lab Results  Component Value Date   CHOL 102 02/22/2021   HDL 36.80 (L) 02/22/2021   LDLCALC 50 02/22/2021   TRIG 76.0 02/22/2021   CHOLHDL 3 02/22/2021   Lab Results  Component Value Date   CREATININE 0.92 02/22/2021   BUN 10 02/22/2021   NA 134 (L) 02/22/2021   K 4.1 02/22/2021   CL 95 (L) 02/22/2021   CO2 30 02/22/2021      Latest Ref Rng & Units 02/22/2021    4:27 PM  02/07/2020    3:54 PM 02/04/2019    3:13 PM  CBC  WBC 4.0 - 10.5 K/uL 6.3   6.1   5.8    Hemoglobin 13.0 - 17.0 g/dL 96.0   45.4   09.8    Hematocrit 39.0 - 52.0 % 41.0   39.9   40.1    Platelets 150.0 - 400.0 K/uL 345.0   309.0   302.0      Lab Results  Component Value Date   HGBA1C 5.7 (H) 05/30/2014   Lab Results  Component Value Date   TSH 2.210 05/30/2014    ==================================================  COVID-19 Education: The signs and symptoms of COVID-19 were discussed with the patient and how to seek care for testing (follow up with PCP or arrange E-visit).    I spent a total of 28 minutes with the patient spent in direct patient consultation.  Additional time spent with chart review  / charting (studies, outside notes, etc): 15 min Total Time: 43 min  Current medicines are reviewed at  length with the patient today.  (+/- concerns) N/A  This visit occurred during the SARS-CoV-2 public health emergency.  Safety protocols were in place, including screening questions prior to the visit, additional usage of staff PPE, and extensive cleaning of exam room while observing appropriate contact time as indicated for disinfecting solutions.  Notice: This dictation was prepared with Dragon dictation along with smart phrase technology. Any transcriptional errors that result from this process are unintentional and may not be corrected upon review.  Studies Ordered:   Orders Placed This Encounter  Procedures   EKG 12-Lead    Patient Instructions / Medication Changes & Studies & Tests Ordered   Patient Instructions  Medication Instructions:  NO CHANGES   *If you need a refill on your cardiac medications before your next appointment, please call your pharmacy*   Lab Work: NOT NEEDED If you have labs (blood work) drawn today and your tests are completely normal, you will receive your results only by: MyChart Message (if you have MyChart) OR A paper copy in the mail If you have any lab test that is abnormal or we need to change your treatment, we will call you to review the results.   Testing/Procedures:  NOT NEEDED  Follow-Up: At [facility], you and your health needs are our priority.  As part of our continuing mission to provide you with exceptional heart care, we have created designated Provider Care Teams.  These Care Teams include your primary Cardiologist (physician) and Advanced Practice Providers (APPs -  Physician Assistants and Nurse Practitioners) who all work together to provide you with the care you need, when you need it.     Your next appointment:   12 month(s)  The format for your next appointment:   In Person  Provider:   Bryan Lemma, MD    Other Instructions   ARM -BLOOD PRESSURE - [place]      Bryan Lemma, M.D., M.S. Interventional  Cardiologist   Pager # [phone] Phone # [phone] [address] [place], [place] [address]   Thank you for choosing Heartcare at [facility]!!

## 2022-01-20 ENCOUNTER — Encounter: Payer: Self-pay | Admitting: Cardiology

## 2022-01-20 NOTE — Assessment & Plan Note (Signed)
No anginal symptoms.  Doing very well.  He is on minimal therapy with low-dose Toprol, intermediate dose atorvastatin and maintenance dose clopidogrel.

## 2022-01-20 NOTE — Assessment & Plan Note (Signed)
Just about 8 years out from his MI.  Doing remarkably well.  Has not had any further anginal symptoms or heart failure symptoms. Myoview back in 2019 showed a small size partially reversible defect consistent with prior scar.  Normal EF.  In the absence of any active symptoms, would not do any more ischemic evaluation.  Plan: Maintenance therapy with low-dose Toprol along with moderate dose atorvastatin and Plavix daily.

## 2022-01-20 NOTE — Assessment & Plan Note (Signed)
Blood pressure is actually high today which is unusual for him He has been on low-dose Toprol-unable to titrate in the past. Continue Toprol 25 mg daily with low threshold to add ACE inhibitor/ARB if pressures continue to be elevated.

## 2022-01-20 NOTE — Assessment & Plan Note (Signed)
Contributes to his dyspnea.  Still using e-cigarettes which probably does not have nearly the amount of damage to affect of the lungs, but still needs to work on quitting so he can probably be free of tobacco/nicotine.

## 2022-01-20 NOTE — Assessment & Plan Note (Signed)
   PCI of the RCA with maintenance dose Plavix as opposed to aspirin.  `Okay to hold Plavix 5 to 7 days preop for surgical procedures.  No need for ischemic evaluation in the absence of symptoms.

## 2022-01-20 NOTE — Assessment & Plan Note (Signed)
Labs were checked last June.  Due for lab check in another month or so.  [facility] see him after the labs are checked. Most recent labs showed LDL of 50 on current dose of atorvastatin.  He had been on Vascepa but which is no longer listed as a medication.  Need to ensure that he is not developing worsening hypertriglyceridemia.

## 2022-01-20 NOTE — Assessment & Plan Note (Signed)
Did well with smoking cessation, but is now much addicted to e-cigarettes.  Needs to finally make a break from e-cigarettes as well.  He is to wean off

## 2022-02-28 ENCOUNTER — Ambulatory Visit (INDEPENDENT_AMBULATORY_CARE_PROVIDER_SITE_OTHER): Payer: BC Managed Care – PPO | Admitting: Internal Medicine

## 2022-02-28 ENCOUNTER — Encounter: Payer: Self-pay | Admitting: Internal Medicine

## 2022-02-28 VITALS — BP 114/70 | HR 55 | Temp 98.0°F | Ht 69.0 in | Wt 141.0 lb

## 2022-02-28 DIAGNOSIS — Z125 Encounter for screening for malignant neoplasm of prostate: Secondary | ICD-10-CM | POA: Diagnosis not present

## 2022-02-28 DIAGNOSIS — Z0000 Encounter for general adult medical examination without abnormal findings: Secondary | ICD-10-CM

## 2022-02-28 DIAGNOSIS — I2510 Atherosclerotic heart disease of native coronary artery without angina pectoris: Secondary | ICD-10-CM

## 2022-02-28 DIAGNOSIS — J439 Emphysema, unspecified: Secondary | ICD-10-CM | POA: Diagnosis not present

## 2022-02-28 NOTE — Progress Notes (Signed)
Subjective:    Patient ID: Ryan Cantrell, male    DOB: Jun 05, 1961, 61 y.o.   MRN: [id number]  HPI Here with wife for physical  Doing okay Same job--now doing 4 --10 hour shifts Brother now with lung cancer Still e-cigs---discussed finally getting rid of it  No heart trouble No chest pain or SOB Never needed nitro Stays active at work---and physically active with other things  No regular cough  Current Outpatient Medications on File Prior to Visit  Medication Sig Dispense Refill   acetaminophen (TYLENOL) 500 MG tablet Take 1,000 mg by mouth 2 (two) times daily as needed for mild pain or moderate pain.      atorvastatin (LIPITOR) 20 MG tablet TAKE 1 TABLET(20 MG) BY MOUTH AT BEDTIME 90 tablet 3   clopidogrel (PLAVIX) 75 MG tablet TAKE 1 TABLET(75 MG) BY MOUTH DAILY 90 tablet 3   metoprolol succinate (TOPROL-XL) 25 MG 24 hr tablet TAKE 1 TABLET(25 MG) BY MOUTH DAILY 90 tablet 3   nitroGLYCERIN (NITROSTAT) 0.4 MG SL tablet Place 1 tablet (0.4 mg total) under the tongue every 5 (five) minutes as needed for chest pain. 25 tablet 5   triamcinolone cream (KENALOG) 0.1 % APPLY EXTERNALLY TO THE AFFECTED AREA TWICE DAILY AS NEEDED 45 g 5   No current facility-administered medications on file prior to visit.    Allergies  Allergen Reactions   Codeine Sulfate Nausea And Vomiting    Past Medical History:  Diagnosis Date   CAD S/P percutaneous coronary angioplasty: Inf STEMI - 100% dRCA - PCI Promus P 2.5 mm x 41mm (2.63mm) 05/29/2014   a. MI 05/29/14 dRCA PCI - Promus Premier DES 2.5 mm x 12 mm (2.8 mm); b. Echo: EF 55-60%, mild basal-inferior HK, normal DD   COPD (chronic obstructive pulmonary disease) (HCC)     PFTs 2013.. Mod severe COPD - Emphysema   ST elevation myocardial infarction (STEMI) of inferior wall, initial episode of care (HCC) 05/29/2014   Echocardiogram 05/30/2014: EF 55-60%. Mild HK of the basal inferior wall. Normal valves    Past Surgical History:  Procedure  Laterality Date   Fracture left humerus  2009   no surgery. Torn rotator cuff also   INGUINAL HERNIA REPAIR     twice on right and once on left-- 1980's-90's   LEFT HEART CATH AND CORONARY ANGIOGRAPHY  05/29/2014   a. MI 05/29/14 dRCA 100%; D1 ~45%, Normal EF __> PCI to RCA   NM MYOVIEW ([facility] HX)  03/03/2018    LOW RISK. Small size, partially reversible apical defect - suggestive of scar & small peri-infarct ischemia.  EF 55-60%.    PERCUTANEOUS CORONARY STENT INTERVENTION (PCI-S)     dRCA - Promus Premie DES 2.5 mm x 12 mm (2.8 mm)   Right shoulder surgery  1987   TRANSTHORACIC ECHOCARDIOGRAM  03/03/2018   EF 55-60%.  No R WMA, normal diastolic parameters.  Normal valves.    Family History  Problem Relation Age of Onset   Hypertension Mother    Diabetes Mother    Heart disease Mother        stent   Cancer Father        prostate   Hypertension Father    Gout Father    Heart disease Father        Stent   Lung cancer Brother    Colon cancer Neg Hx    Stomach cancer Neg Hx     Social History   Socioeconomic  History   Marital status: Married    Spouse name: Not on file   Number of children: 1   Years of education: Not on file   Highest education level: Not on file  Occupational History   Occupation: Control and instrumentation engineer  Tobacco Use   Smoking status: Former    Packs/day: 1.00    Types: E-cigarettes, Cigarettes    Quit date: 05/29/2014    Years since quitting: 7.7    Passive exposure: Past (as a child)   Smokeless tobacco: Never   Tobacco comments:    1 carton a day of e-cigarette--no cigarettes since MI  Vaping Use   Vaping Use: Every day   Start date: 06/02/2014  Substance and Sexual Activity   Alcohol use: No    Alcohol/week: 0.0 standard drinks of alcohol   Drug use: No   Sexual activity: Yes  Other Topics Concern   Not on file  Social History Narrative   Married - 1 child.   Smokes 1/2 ppd.   Works for a [employer] - hard physical & stressful  labor.   Social Determinants of Health   Financial Resource Strain: Not on file  Food Insecurity: Not on file  Transportation Needs: Not on file  Physical Activity: Not on file  Stress: Not on file  Social Connections: Not on file  Intimate Partner Violence: Not on file   Review of Systems  Constitutional:  Negative for fatigue and unexpected weight change.       Wears seat belt  HENT:  Negative for hearing loss and tinnitus.        Full dentures  Eyes:  Negative for visual disturbance.        No diplopia or unilateral vision loss  Respiratory:  Negative for cough and chest tightness.        SOB only if he really pushes it  Cardiovascular:  Negative for chest pain, palpitations and leg swelling.  Gastrointestinal:  Negative for blood in stool and constipation.       No heartburn   Endocrine: Negative for polydipsia and polyuria.  Genitourinary:  Negative for difficulty urinating and urgency.       Some ED--never filled the med  Musculoskeletal:  Negative for arthralgias, back pain and joint swelling.  Skin:        Slight eczema on foot  Allergic/Immunologic: Negative for environmental allergies and immunocompromised state.  Neurological:  Positive for headaches. Negative for dizziness, syncope and light-headedness.  Hematological:  Negative for adenopathy. Does not bruise/bleed easily.  Psychiatric/Behavioral:  Negative for dysphoric mood and sleep disturbance. The patient is not nervous/anxious.        Objective:   Physical Exam Constitutional:      Appearance: Normal appearance.  HENT:     Mouth/Throat:     Pharynx: No oropharyngeal exudate or posterior oropharyngeal erythema.  Eyes:     Conjunctiva/sclera: Conjunctivae normal.     Pupils: Pupils are equal, round, and reactive to light.  Cardiovascular:     Rate and Rhythm: Normal rate and regular rhythm.     Pulses: Normal pulses.     Heart sounds: No murmur heard.    No gallop.  Pulmonary:     Effort:  Pulmonary effort is normal.     Breath sounds: Normal breath sounds. No wheezing or rales.  Abdominal:     Palpations: Abdomen is soft.     Tenderness: There is no abdominal tenderness.  Musculoskeletal:     Cervical back:  Neck supple.     Right lower leg: No edema.     Left lower leg: No edema.  Lymphadenopathy:     Cervical: No cervical adenopathy.  Skin:    Findings: No lesion or rash.  Neurological:     General: No focal deficit present.     Mental Status: He is alert and oriented to person, place, and time.  Psychiatric:        Mood and Affect: Mood normal.        Behavior: Behavior normal.            Assessment & Plan:

## 2022-02-28 NOTE — Assessment & Plan Note (Signed)
No symptoms On atorvastatin 20, plavix 75, metoprolol 25

## 2022-02-28 NOTE — Assessment & Plan Note (Signed)
Healthy Stays active Colon due in December Will recheck PSA---no action unless >>5 Flu vaccine in the fall He prefers no shingrix Will consider COVID vaccine--but not excited

## 2022-02-28 NOTE — Assessment & Plan Note (Signed)
Urged him to try to cut down on the e-cigs

## 2022-03-01 LAB — COMPREHENSIVE METABOLIC PANEL
ALT: 12 U/L (ref 0–53)
AST: 15 U/L (ref 0–37)
Albumin: 4.8 g/dL (ref 3.5–5.2)
Alkaline Phosphatase: 54 U/L (ref 39–117)
BUN: 12 mg/dL (ref 6–23)
CO2: 31 mEq/L (ref 19–32)
Calcium: 9.4 mg/dL (ref 8.4–10.5)
Chloride: 99 mEq/L (ref 96–112)
Creatinine, Ser: 0.96 mg/dL (ref 0.40–1.50)
GFR: 86.05 mL/min (ref >60.00)
Glucose, Bld: 95 mg/dL (ref 70–99)
Potassium: 4.6 mEq/L (ref 3.5–5.1)
Sodium: 136 mEq/L (ref 135–145)
Total Bilirubin: 2.3 mg/dL — ABNORMAL HIGH (ref 0.2–1.2)
Total Protein: 7.4 g/dL (ref 6.0–8.3)

## 2022-03-01 LAB — LIPID PANEL
Cholesterol: 107 mg/dL (ref 0–200)
HDL: 39 mg/dL — ABNORMAL LOW (ref >39.00)
LDL Cholesterol: 49 mg/dL (ref 0–99)
NonHDL: 68.4
Total CHOL/HDL Ratio: 3
Triglycerides: 98 mg/dL (ref 0.0–149.0)
VLDL: 19.6 mg/dL (ref 0.0–40.0)

## 2022-03-01 LAB — PSA: PSA: 5.16 ng/mL — ABNORMAL HIGH (ref 0.10–4.00)

## 2022-03-01 LAB — CBC
HCT: 41.7 % (ref 39.0–52.0)
Hemoglobin: 14 g/dL (ref 13.0–17.0)
MCHC: 33.5 g/dL (ref 30.0–36.0)
MCV: 88.4 fl (ref 78.0–100.0)
Platelets: 299 10*3/uL (ref 150.0–400.0)
RBC: 4.72 Mil/uL (ref 4.22–5.81)
RDW: 13.5 % (ref 11.5–15.5)
WBC: 5 10*3/uL (ref 4.0–10.5)

## 2022-10-31 ENCOUNTER — Other Ambulatory Visit: Payer: Self-pay | Admitting: Cardiology

## 2022-11-01 ENCOUNTER — Other Ambulatory Visit: Payer: Self-pay | Admitting: Cardiology

## 2022-11-13 ENCOUNTER — Encounter: Payer: Self-pay | Admitting: Gastroenterology

## 2022-12-18 ENCOUNTER — Other Ambulatory Visit: Payer: Self-pay | Admitting: Cardiology

## 2022-12-21 DIAGNOSIS — M542 Cervicalgia: Secondary | ICD-10-CM | POA: Diagnosis not present

## 2022-12-23 ENCOUNTER — Telehealth: Payer: Self-pay | Admitting: Cardiology

## 2022-12-23 NOTE — Telephone Encounter (Signed)
*  STAT* If patient is at the pharmacy, call can be transferred to refill team.   1. Which medications need to be refilled? (please list name of each medication and dose if known)  atorvastatin (LIPITOR) 20 MG tablet  clopidogrel (PLAVIX) 75 MG tablet  metoprolol succinate (TOPROL-XL) 25 MG 24 hr tablet  2. Which pharmacy/location (including street and city if local pharmacy) is medication to be sent to?  [id number] - [place], [place] - [address] AT [facility] OF [address]    3. Do they need a 30 day or 90 day supply? 90 day    Need enough medication to last him until 07/03 appt

## 2022-12-30 ENCOUNTER — Other Ambulatory Visit: Payer: Self-pay | Admitting: Cardiology

## 2023-03-05 ENCOUNTER — Encounter: Payer: Self-pay | Admitting: Cardiology

## 2023-03-05 ENCOUNTER — Ambulatory Visit: Payer: BC Managed Care – PPO | Attending: Cardiology | Admitting: Cardiology

## 2023-03-05 VITALS — BP 136/82 | HR 53 | Ht 70.0 in | Wt 143.2 lb

## 2023-03-05 DIAGNOSIS — J439 Emphysema, unspecified: Secondary | ICD-10-CM

## 2023-03-05 DIAGNOSIS — R030 Elevated blood-pressure reading, without diagnosis of hypertension: Secondary | ICD-10-CM

## 2023-03-05 DIAGNOSIS — I2119 ST elevation (STEMI) myocardial infarction involving other coronary artery of inferior wall: Secondary | ICD-10-CM | POA: Diagnosis not present

## 2023-03-05 DIAGNOSIS — I2510 Atherosclerotic heart disease of native coronary artery without angina pectoris: Secondary | ICD-10-CM | POA: Diagnosis not present

## 2023-03-05 DIAGNOSIS — Z87891 Personal history of nicotine dependence: Secondary | ICD-10-CM | POA: Diagnosis not present

## 2023-03-05 DIAGNOSIS — E785 Hyperlipidemia, unspecified: Secondary | ICD-10-CM

## 2023-03-05 DIAGNOSIS — Z9861 Coronary angioplasty status: Secondary | ICD-10-CM

## 2023-03-05 DIAGNOSIS — N5201 Erectile dysfunction due to arterial insufficiency: Secondary | ICD-10-CM

## 2023-03-05 MED ORDER — LOSARTAN POTASSIUM 25 MG PO TABS: 25.0000 mg | ORAL_TABLET | Freq: Every day | ORAL | 3 refills | Status: DC

## 2023-03-05 NOTE — Progress Notes (Unsigned)
Cardiology Office Note:  .   Date:  03/06/2023  ID:  Ryan Cantrell, DOB 1961/02/20, MRN [id number] PCP: Karie Schwalbe, MD  [facility] Providers Cardiologist:  Bryan Lemma, MD     Chief Complaint  Patient presents with   Follow-up    Annual   Coronary Artery Disease    No active angina or CHF.     History of Present Illness: Marland Kitchen     Ryan Cantrell is a very pleasant 63 y.o. male former smoker with a PMH noted below who presents here today for annual follow-up at the request of Karie Schwalbe, MD.   Pertinent PMH: CAD-PCI RCA in setting of Inferior STEMI (05/2014) -> on Plavix monotherapy CRF's: Former smoker, HTN and HLD  HLD -> atorvastatin 20 mg daily HTN -previously maintained on only 25 mg Toprol Moderate to Severe COPD-Emphysema (former smoker having quit after MI-uses e-cigarettes)  Poor Dentition (now status post most of his teeth removal with dentures.  Long process)  Ryan Cantrell was last seen in April 2023.  Doing well.  No changes made.  BP was little high consider the possibility of adding ARB/ACE.  Some exertional dyspnea related to COPD but otherwise just minimal ankle swelling.  Wife is concerned about use of a cart a day e-cigarettes.  No longer vaping.  Not exercising much as he used to because he been very busy at work. No new studies ordered     Subjective   Ryan Cantrell returns here today for somewhat delayed annual follow-up doing pretty well.  He had a change in his work schedule to going from 8 hours/day shifts to 10 hour/day shifts.  He does feel quite worn out by the end of the 10 hours but has not had any major issues with shortness of breath or chest tightness/pressure.  He is trying to stay active but with a 10-hour shifts he is oftentimes tired but that gives him some days off and on those days he is able to be active.  No routine exercise, stays active.  Still has a little short of breath from long-term smoking, but if  anything is getting better.  No PND, orthopnea.  No resting exertional chest pain or pressure.  No arrhythmia symptoms and no CHF symptoms.  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  His wife is worried that he will be staying up late hours and "going out" quite hard.  She was using this as a way of having me help advised him to take it easy.  The 1 thing they did notice erectile dysfunction, but are not overly worried about that and did not broach the question of potential prescription for Viagra.  We discussed this in this past, and what ever reason did not go forward with it.  Very happy with his new dentures.  ROS:  Cardiovascular ROS: positive for - dyspnea on exertion and mild exercise tolerance, more related to COPD than any chest tightness or pressure. negative for - chest pain, edema, irregular heartbeat, orthopnea, palpitations, paroxysmal nocturnal dyspnea, rapid heart rate, shortness of breath, or syncope/near syncope or TIA/RCVS, claudication Review of Systems - Negative except getting somewhat fatigued at the end of the long 10-hour shift.  He did well with 8-hour shifts but the 10 hours the last 2 hours this  takes a lot of it. Respiratory ROS: positive for - cough, wheezing, and baseline DOE negative for - hemoptysis, orthopnea, pleuritic pain, sputum changes, or tachypnea Gastrointestinal ROS: negative for - appetite loss, blood in stools, constipation, diarrhea, melena, nausea/vomiting, or swallowing difficulty/pain Genito-Urinary ROS: no dysuria, trouble voiding, or hematuria negative for - dysuria, hematuria, incontinence, or nocturia Neurological ROS: no TIA or stroke symptoms    Objective   Studies Reviewed: Marland Kitchen   EKG Interpretation Date/Time:  Wednesday March 05 2023 09:44:02 EDT Ventricular Rate:  53 PR Ryan:  150 QRS  Duration:  154 QT Ryan:  484 QTC Calculation: 454 R Axis:   95  Text Interpretation: Sinus bradycardia Right bundle branch block When compared with ECG of 02-Mar-2018 18:54, Right bundle branch block has replaced Non-specific intra-ventricular conduction block Confirmed by Bryan Lemma ([id number]) on 03/05/2023 9:57:04 AM    Past CV PROCEDURES:  Procedure  Date   LEFT HEART CATH AND CORONARY ANGIOGRAPHY  05/29/2014   a. MI 05/29/14 dRCA 100%; D1 ~45%, Normal EF __> PCI to RCA   NM MYOVIEW ([facility] HX)  03/03/2018    LOW RISK. Small size, partially reversible apical defect - suggestive of scar & small peri-infarct ischemia.  EF 55-60%.    PERCUTANEOUS CORONARY STENT INTERVENTION (PCI-S)     dRCA - Promus Premie DES 2.5 mm x 12 mm (2.8 mm)   TRANSTHORACIC ECHOCARDIOGRAM  03/03/2018   EF 55-60%.  No R WMA, normal diastolic parameters.  Normal valves.    Risk Assessment/Calculations:              Physical Exam:   VS:  BP 136/82   Pulse (!) 53   Ht 5\' 10"  (1.778 m)   Wt 143 lb 3.2 oz (65 kg)   SpO2 99%   BMI 20.55 kg/m    Wt Readings from Last 3 Encounters:  03/05/23 143 lb 3.2 oz (65 kg)  02/28/22 141 lb (64 kg)  12/18/21 146 lb (66.2 kg)    GEN: Well nourished, well groomed.  In no acute distress; healthy-appearing. NECK: No JVD; No carotid bruits CARDIAC: Normal S1, S2; RRR, no murmurs, rubs, gallops RESPIRATORY:  Clear to auscultation without rales, wheezing or rhonchi ; nonlabored, good air movement. ABDOMEN: Soft, non-tender, non-distended EXTREMITIES:  No edema; No deformity     ASSESSMENT AND PLAN: .    Problem List Items Addressed This Visit       Cardiology Problems   ST elevation myocardial infarction (STEMI) of inferior wall, subsequent episode of care (HCC) - Primary (Chronic)    Almost 9 years out from his MI doing remarkably well with no active angina or heart failure symptoms.  Small reversible defect in the inferior wall noted on the IV back in 2019.  No  ischemia.  Preserved EF.  Progressed well post MI.      Relevant Medications   losartan (COZAAR) 25 MG tablet   Other Relevant Orders   EKG 12-Lead (Completed)   Hyperlipidemia with target LDL less than 70 (Chronic)    Due for labs to be checked by PCP soon back in 2023 his LDL was 49 with an HDL 39.  Overall looks great.  Continue to follow-up with PCP. Continue atorvastatin 20 mg daily.      Relevant Medications   losartan (COZAAR) 25 MG tablet   Coronary artery disease involving native heart without angina pectoris (Chronic)    No longer having any anginal symptoms after PCI.  He is on stable  regimen but BP still needs better control.  Plan: Continue current dose of atorvastatin with labs previously well-controlled-due for labs recheck with PCP soon. Continue low-dose Toprol along with Plavix.      Relevant Medications   losartan (COZAAR) 25 MG tablet   Other Relevant Orders   EKG 12-Lead (Completed)   CAD S/P percutaneous coronary angioplasty: Inf STEMI - 100% dRCA - PCI Promus P 2.5 mm x 12mm (2.9mm) (Chronic)    History of PCI to the RCA in setting of 18 inferior STEMI.  Has been maintained on Plavix monotherapy. No active angina therefore no need for further ischemic evaluations.  Okay to hold Plavix 5 to 7 days preop for surgeries or procedures.      Relevant Medications   losartan (COZAAR) 25 MG tablet     Other   Pulmonary emphysema (HCC) (Chronic)   Former cigarette smoker (Chronic)    Congratulated his efforts, but hoping that he can wean off the cigarettes      Erectile dysfunction (Chronic)    He did talk about it with ED.  Low threshold to prescribe  He score.  So far did not ask.      Borderline systolic hypertension (Chronic)    Blood pressure is high today which is unusual for him but the last couple days available elevated.  Continue Toprol 25 mg daily with resting heart of 53 bpm cannot titrate further. His BP is not adequately controlled  despite recheck of 137/80,  I would like to add losartan 25 mg daily for additional afterload reduction.               Dispo: Return in about 1 year (around 03/04/2024).  Total time spent: 26 min spent with patient + 16 min spent charting = 42 min     Signed, Marykay Lex, MD, MS Bryan Lemma, M.D., M.S. Interventional Cardiologist  [facility]  Pager # [phone] Phone # [phone] [address] [place], [place] [address]

## 2023-03-05 NOTE — Patient Instructions (Addendum)
Medication Instructions:    Start taking Losartan 25 mg -- for the first week take 1/2 tablet daily , then increase to whole tablet  daily   *If you need a refill on your cardiac medications before your next appointment, please call your pharmacy*   Lab Work: Not needed    Testing/Procedures: Not needed   Follow-Up: At [facility], you and your health needs are our priority.  As part of our continuing mission to provide you with exceptional heart care, we have created designated Provider Care Teams.  These Care Teams include your primary Cardiologist (physician) and Advanced Practice Providers (APPs -  Physician Assistants and Nurse Practitioners) who all work together to provide you with the care you need, when you need it.     Your next appointment:   12 month(s)  The format for your next appointment:   In Person  Provider:   Bryan Lemma, MD

## 2023-03-06 ENCOUNTER — Encounter: Payer: Self-pay | Admitting: Cardiology

## 2023-03-06 NOTE — Assessment & Plan Note (Signed)
History of PCI to the RCA in setting of 18 inferior STEMI.  Has been maintained on Plavix monotherapy. No active angina therefore no need for further ischemic evaluations.  Okay to hold Plavix 5 to 7 days preop for surgeries or procedures.

## 2023-03-06 NOTE — Assessment & Plan Note (Signed)
Congratulated his efforts, but hoping that he can wean off the cigarettes

## 2023-03-06 NOTE — Assessment & Plan Note (Signed)
No longer having any anginal symptoms after PCI.  He is on stable regimen but BP still needs better control.  Plan: Continue current dose of atorvastatin with labs previously well-controlled-due for labs recheck with PCP soon. Continue low-dose Toprol along with Plavix.

## 2023-03-06 NOTE — Assessment & Plan Note (Signed)
He did talk about it with ED.  Low threshold to prescribe  He score.  So far did not ask.

## 2023-03-06 NOTE — Assessment & Plan Note (Signed)
Blood pressure is high today which is unusual for him but the last couple days available elevated.  Continue Toprol 25 mg daily with resting heart of 53 bpm cannot titrate further. His BP is not adequately controlled despite recheck of 137/80,  I would like to add losartan 25 mg daily for additional afterload reduction.

## 2023-03-06 NOTE — Assessment & Plan Note (Signed)
Due for labs to be checked by PCP soon back in 2023 his LDL was 49 with an HDL 39.  Overall looks great.  Continue to follow-up with PCP. Continue atorvastatin 20 mg daily.

## 2023-03-06 NOTE — Assessment & Plan Note (Signed)
Almost 9 years out from his MI doing remarkably well with no active angina or heart failure symptoms.  Small reversible defect in the inferior wall noted on the IV back in 2019.  No ischemia.  Preserved EF.  Progressed well post MI.

## 2023-03-07 ENCOUNTER — Encounter: Payer: BC Managed Care – PPO | Admitting: Internal Medicine

## 2023-03-14 ENCOUNTER — Encounter: Payer: Self-pay | Admitting: Internal Medicine

## 2023-03-14 ENCOUNTER — Ambulatory Visit (INDEPENDENT_AMBULATORY_CARE_PROVIDER_SITE_OTHER): Payer: BC Managed Care – PPO | Admitting: Internal Medicine

## 2023-03-14 VITALS — BP 138/80 | HR 57 | Temp 97.8°F | Ht 69.0 in | Wt 141.0 lb

## 2023-03-14 DIAGNOSIS — I2510 Atherosclerotic heart disease of native coronary artery without angina pectoris: Secondary | ICD-10-CM

## 2023-03-14 DIAGNOSIS — Z125 Encounter for screening for malignant neoplasm of prostate: Secondary | ICD-10-CM

## 2023-03-14 DIAGNOSIS — J439 Emphysema, unspecified: Secondary | ICD-10-CM

## 2023-03-14 DIAGNOSIS — R030 Elevated blood-pressure reading, without diagnosis of hypertension: Secondary | ICD-10-CM

## 2023-03-14 DIAGNOSIS — Z0000 Encounter for general adult medical examination without abnormal findings: Secondary | ICD-10-CM | POA: Diagnosis not present

## 2023-03-14 LAB — COMPREHENSIVE METABOLIC PANEL
ALT: 13 U/L (ref 0–53)
AST: 16 U/L (ref 0–37)
Albumin: 4.7 g/dL (ref 3.5–5.2)
Alkaline Phosphatase: 56 U/L (ref 39–117)
BUN: 8 mg/dL (ref 6–23)
CO2: 32 mEq/L (ref 19–32)
Calcium: 9.7 mg/dL (ref 8.4–10.5)
Chloride: 96 mEq/L (ref 96–112)
Creatinine, Ser: 0.91 mg/dL (ref 0.40–1.50)
GFR: 91.09 mL/min (ref >60.00)
Glucose, Bld: 95 mg/dL (ref 70–99)
Potassium: 4.3 mEq/L (ref 3.5–5.1)
Sodium: 135 mEq/L (ref 135–145)
Total Bilirubin: 2.3 mg/dL — ABNORMAL HIGH (ref 0.2–1.2)
Total Protein: 7.6 g/dL (ref 6.0–8.3)

## 2023-03-14 LAB — CBC
HCT: 41 % (ref 39.0–52.0)
Hemoglobin: 13.8 g/dL (ref 13.0–17.0)
MCHC: 33.6 g/dL (ref 30.0–36.0)
MCV: 87.3 fl (ref 78.0–100.0)
Platelets: 338 10*3/uL (ref 150.0–400.0)
RBC: 4.7 Mil/uL (ref 4.22–5.81)
RDW: 13 % (ref 11.5–15.5)
WBC: 6.9 10*3/uL (ref 4.0–10.5)

## 2023-03-14 LAB — LIPID PANEL
Cholesterol: 103 mg/dL (ref 0–200)
HDL: 39.8 mg/dL (ref >39.00)
LDL Cholesterol: 51 mg/dL (ref 0–99)
NonHDL: 63.62
Total CHOL/HDL Ratio: 3
Triglycerides: 64 mg/dL (ref 0.0–149.0)
VLDL: 12.8 mg/dL (ref 0.0–40.0)

## 2023-03-14 LAB — PSA: PSA: 5.53 ng/mL — ABNORMAL HIGH (ref 0.10–4.00)

## 2023-03-14 NOTE — Assessment & Plan Note (Signed)
Healthy Needs to set up screening colonoscopy Will check PSA Doesn't want COVID--and not sure about shingrix Flu vaccine this fall Consider RSV

## 2023-03-14 NOTE — Assessment & Plan Note (Signed)
No major symptoms Urged him to give up the 3e-cigarettes

## 2023-03-14 NOTE — Assessment & Plan Note (Signed)
No angina On plavix, atorvastatin 20, metoprolol 25. Now on low dose losartan

## 2023-03-14 NOTE — Assessment & Plan Note (Signed)
BP Readings from Last 3 Encounters:  03/14/23 138/80  03/05/23 136/82  02/28/22 114/70   Now on losartan 25mg 

## 2023-03-14 NOTE — Progress Notes (Signed)
Subjective:    Patient ID: Ryan Cantrell, male    DOB: 1960/11/09, 62 y.o.   MRN: [id number]  HPI Here for physical--with wife  Still doing e-cigs---has cut them back Breathing is okay No regular cough  Losartan just started by Dr Herbie Baltimore Starting with 1/2 of the 25mg    Current Outpatient Medications on File Prior to Visit  Medication Sig Dispense Refill   acetaminophen (TYLENOL) 500 MG tablet Take 1,000 mg by mouth 2 (two) times daily as needed for mild pain or moderate pain.      atorvastatin (LIPITOR) 20 MG tablet TAKE 1 TABLET(20 MG) BY MOUTH AT BEDTIME 90 tablet 0   clopidogrel (PLAVIX) 75 MG tablet TAKE 1 TABLET(75 MG) BY MOUTH DAILY 90 tablet 0   metoprolol succinate (TOPROL-XL) 25 MG 24 hr tablet TAKE 1 TABLET(25 MG) BY MOUTH DAILY 90 tablet 0   nitroGLYCERIN (NITROSTAT) 0.4 MG SL tablet Place 1 tablet (0.4 mg total) under the tongue every 5 (five) minutes as needed for chest pain. 25 tablet 5   triamcinolone cream (KENALOG) 0.1 % APPLY EXTERNALLY TO THE AFFECTED AREA TWICE DAILY AS NEEDED 45 g 5   losartan (COZAAR) 25 MG tablet Take 1 tablet (25 mg total) by mouth daily. (Patient not taking: Reported on 03/14/2023) 90 tablet 3   No current facility-administered medications on file prior to visit.    Allergies  Allergen Reactions   Codeine Sulfate Nausea And Vomiting    Past Medical History:  Diagnosis Date   CAD S/P percutaneous coronary angioplasty: Inf STEMI - 100% dRCA - PCI Promus P 2.5 mm x 12mm (2.94mm) 05/29/2014   a. MI 05/29/14 dRCA PCI - Promus Premier DES 2.5 mm x 12 mm (2.8 mm); b. Echo: EF 55-60%, mild basal-inferior HK, normal DD   COPD (chronic obstructive pulmonary disease) (HCC)     PFTs 2013.. Mod severe COPD - Emphysema   ST elevation myocardial infarction (STEMI) of inferior wall, initial episode of care (HCC) 05/29/2014   Echocardiogram 05/30/2014: EF 55-60%. Mild HK of the basal inferior wall. Normal valves    Past Surgical History:  Procedure  Laterality Date   Fracture left humerus  2009   no surgery. Torn rotator cuff also   INGUINAL HERNIA REPAIR     twice on right and once on left-- 1980's-90's   LEFT HEART CATH AND CORONARY ANGIOGRAPHY  05/29/2014   a. MI 05/29/14 dRCA 100%; D1 ~45%, Normal EF __> PCI to RCA   NM MYOVIEW ([facility] HX)  03/03/2018    LOW RISK. Small size, partially reversible apical defect - suggestive of scar & small peri-infarct ischemia.  EF 55-60%.    PERCUTANEOUS CORONARY STENT INTERVENTION (PCI-S)     dRCA - Promus Premie DES 2.5 mm x 12 mm (2.8 mm)   Right shoulder surgery  1987   TRANSTHORACIC ECHOCARDIOGRAM  03/03/2018   EF 55-60%.  No R WMA, normal diastolic parameters.  Normal valves.    Family History  Problem Relation Age of Onset   Hypertension Mother    Diabetes Mother    Heart disease Mother        stent   Cancer Father        prostate   Hypertension Father    Gout Father    Heart disease Father        Stent   Lung cancer Brother    Colon cancer Neg Hx    Stomach cancer Neg Hx     Social  History   Socioeconomic History   Marital status: Married    Spouse name: Not on file   Number of children: 1   Years of education: Not on file   Highest education level: Not on file  Occupational History   Occupation: Control and instrumentation engineer  Tobacco Use   Smoking status: Former    Current packs/day: 0.00    Types: E-cigarettes, Cigarettes    Quit date: 05/29/2014    Years since quitting: 8.7    Passive exposure: Past (as a child)   Smokeless tobacco: Never   Tobacco comments:    1 carton a day of e-cigarette--no cigarettes since MI  Vaping Use   Vaping status: Every Day   Start date: 06/02/2014  Substance and Sexual Activity   Alcohol use: No    Alcohol/week: 0.0 standard drinks of alcohol   Drug use: No   Sexual activity: Yes  Other Topics Concern   Not on file  Social History Narrative   Married - 1 child.   Smokes 1/2 ppd.   Works for a [employer] - hard physical &  stressful labor.   Social Determinants of Health   Financial Resource Strain: Not on file  Food Insecurity: Not on file  Transportation Needs: Not on file  Physical Activity: Not on file  Stress: Not on file  Social Connections: Not on file  Intimate Partner Violence: Not on file   Review of Systems  Constitutional:  Negative for fatigue and unexpected weight change.       No set exercise---but very active at work Wears seat belt  HENT:  Negative for hearing loss and tinnitus.        Full dentures--may need them adjusted  Eyes:  Negative for visual disturbance.       No diplopia or unilateral vision loss  Respiratory:  Negative for cough, chest tightness, shortness of breath and wheezing.   Cardiovascular:  Negative for chest pain, palpitations and leg swelling.  Gastrointestinal:  Negative for blood in stool and constipation.       No heartburn  Endocrine: Negative for polydipsia and polyuria.  Genitourinary:  Negative for dysuria and hematuria.       Some ED--has viagra  Musculoskeletal:  Negative for arthralgias, back pain and joint swelling.  Skin:  Negative for rash.       Has itchy spot under left nipple--itchy  Allergic/Immunologic: Negative for environmental allergies and immunocompromised state.  Neurological:  Negative for dizziness, syncope, light-headedness and headaches.  Hematological:  Negative for adenopathy. Bruises/bleeds easily.  Psychiatric/Behavioral:  Negative for dysphoric mood and sleep disturbance. The patient is not nervous/anxious.        Objective:   Physical Exam Constitutional:      Appearance: Normal appearance.  HENT:     Mouth/Throat:     Pharynx: No oropharyngeal exudate or posterior oropharyngeal erythema.  Eyes:     Conjunctiva/sclera: Conjunctivae normal.     Pupils: Pupils are equal, round, and reactive to light.  Cardiovascular:     Rate and Rhythm: Normal rate and regular rhythm.     Pulses: Normal pulses.     Heart sounds: No  murmur heard.    No gallop.  Pulmonary:     Effort: Pulmonary effort is normal.     Breath sounds: No wheezing or rales.     Comments: Slightly decreased breath sounds but clear Abdominal:     Palpations: Abdomen is soft.     Tenderness: There is no abdominal  tenderness.  Musculoskeletal:     Cervical back: Neck supple.     Right lower leg: No edema.     Left lower leg: No edema.  Lymphadenopathy:     Cervical: No cervical adenopathy.  Skin:    Findings: No lesion or rash.     Comments: Small pimple under left nipple---if persists will freeze  Neurological:     General: No focal deficit present.     Mental Status: He is alert and oriented to person, place, and time.  Psychiatric:        Mood and Affect: Mood normal.        Behavior: Behavior normal.            Assessment & Plan:

## 2023-03-14 NOTE — Patient Instructions (Signed)
Please set up your screening colonoscopy 

## 2023-04-03 ENCOUNTER — Other Ambulatory Visit: Payer: Self-pay | Admitting: Cardiology

## 2023-04-04 ENCOUNTER — Encounter: Payer: Self-pay | Admitting: Cardiology

## 2023-05-19 DIAGNOSIS — M1711 Unilateral primary osteoarthritis, right knee: Secondary | ICD-10-CM | POA: Diagnosis not present

## 2023-06-06 ENCOUNTER — Other Ambulatory Visit: Payer: Self-pay | Admitting: Internal Medicine

## 2023-06-06 DIAGNOSIS — Z1211 Encounter for screening for malignant neoplasm of colon: Secondary | ICD-10-CM

## 2023-06-06 DIAGNOSIS — Z1212 Encounter for screening for malignant neoplasm of rectum: Secondary | ICD-10-CM

## 2023-07-08 DIAGNOSIS — Z1211 Encounter for screening for malignant neoplasm of colon: Secondary | ICD-10-CM | POA: Diagnosis not present

## 2023-07-08 DIAGNOSIS — Z1212 Encounter for screening for malignant neoplasm of rectum: Secondary | ICD-10-CM | POA: Diagnosis not present

## 2023-07-17 LAB — COLOGUARD: COLOGUARD: NEGATIVE

## 2023-09-12 ENCOUNTER — Encounter: Payer: Self-pay | Admitting: Internal Medicine

## 2023-09-12 MED ORDER — TRIAMCINOLONE ACETONIDE 0.1 % EX CREA: TOPICAL_CREAM | Freq: Two times a day (BID) | CUTANEOUS | 1 refills | Status: AC | PRN

## 2024-03-19 ENCOUNTER — Encounter: Payer: BC Managed Care – PPO | Admitting: Internal Medicine

## 2024-03-19 ENCOUNTER — Ambulatory Visit: Attending: Cardiology | Admitting: Cardiology

## 2024-03-19 ENCOUNTER — Encounter: Payer: Self-pay | Admitting: Cardiology

## 2024-03-19 VITALS — BP 134/66 | HR 59 | Resp 16 | Ht 69.0 in | Wt 155.1 lb

## 2024-03-19 DIAGNOSIS — I2510 Atherosclerotic heart disease of native coronary artery without angina pectoris: Secondary | ICD-10-CM | POA: Diagnosis not present

## 2024-03-19 DIAGNOSIS — E785 Hyperlipidemia, unspecified: Secondary | ICD-10-CM | POA: Diagnosis not present

## 2024-03-19 DIAGNOSIS — Z9861 Coronary angioplasty status: Secondary | ICD-10-CM

## 2024-03-19 DIAGNOSIS — I10 Essential (primary) hypertension: Secondary | ICD-10-CM | POA: Diagnosis not present

## 2024-03-19 DIAGNOSIS — J439 Emphysema, unspecified: Secondary | ICD-10-CM

## 2024-03-19 NOTE — Assessment & Plan Note (Signed)
 Lipids last year were well within target range with LDL 51.  Should be due get labs checked pretty soon by his PCP for his upcoming visit in August.  Anticipates having lipids checked for that visit.  He seems to be tolerating his 20 mg atorvastatin  without any difficulty.  No myalgias, arthralgias or memory issues. - Continue current dose of atorvastatin  20 mg daily.

## 2024-03-19 NOTE — Patient Instructions (Addendum)

## 2024-03-19 NOTE — Progress Notes (Signed)
 Cardiology Office Note:  .   Date:  03/19/2024  ID:  Ryan Cantrell, DOB November 07, 1960, MRN [id number] PCP: Jimmy Charlie FERNS, MD  [facility] Providers Cardiologist:  Alm Clay, MD     Chief Complaint  Patient presents with   Coronary artery disease involving native coronary artery of   ST elevation myocardial infarction (STEMI) of inferior wall    10 years out   Follow-up    12 months    Patient Profile: Ryan Cantrell     Ryan Cantrell is a 63 y.o. male former heavy smoker with a PMH reviewed below who presents here for annual follow-up at the request of Jimmy Charlie FERNS, MD.  Pertinent PMH: CAD-PCI RCA in setting of Inferior STEMI (05/2014) -> on Plavix  monotherapy CRF's: Former smoker, HTN and HLD  HLD -> atorvastatin  20 mg daily HTN -previously maintained on only 25 mg Toprol  Moderate to Severe COPD-Emphysema (former smoker having quit after MI-uses e-cigarettes)  Poor Dentition (now status post most of his teeth removal with dentures.  Long process)  Ryan Cantrell  seen in April 2023.  Doing well.  No changes made.  BP was little high consider the possibility of adding ARB/ACE.  Some exertional dyspnea related to COPD but otherwise just minimal ankle swelling.  Wife is concerned about use of a cart a day e-cigarettes.  No longer vaping.  Not exercising much as he used to because he been very busy at work. 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     Ryan Cantrell was last seen on 03/05/2023 for a routine follow-up doing well.  He had shifted from 8-hour day shifts to 10-hour shifts and was getting worn out by the end of the 10 hours.  But no chest pain or dyspnea.  Staying active but oftentimes does not have time to exercise when he gets home.  Some dyspnea on exertion due to smoking but continues to improve after smoking cessation.  He did note some erectile dysfunction but we did not yet talk about Viagra.  BP was little elevated.  We  started low-dose losartan  that he was start 1/2 tablet for 1 week and then increase to a whole tablet daily.  Subjective  Discussed the use of AI scribe software for clinical note transcription with the patient, who gave verbal consent to proceed.  History of Present Illness  Ryan Cantrell is a 63 year old male with coronary artery disease who presents for a follow-up visit.  He had a myocardial infarction on May 29, 2014, and has been under regular follow-up since then. No chest pain, tightness, or pressure. No palpitations, leg swelling, shortness of breath, or dizziness.  He is on Plavix  instead of aspirin  to avoid stomach upset. He is currently on a low dose of losartan  (25 mg) and Toprol . He has not been regularly checking his blood pressure at home.  His cholesterol levels were reported to be excellent last year, and his A1c was 5.7. He has not had any recent diagnostic studies as they were deemed unnecessary.  He recalls being anxious about his health after his heart attack but has been managing well since then. He has not smoked cigarettes since his heart attack, although he used to smoke electronic cigarettes. No recent illnesses, fevers, chills, or sweats.   Cardiovascular ROS: positive for - dyspnea on exertion and still has mild COPD related exercise intolerance and dyspnea.  But notably  improved. negative for - chest pain, edema, irregular heartbeat, orthopnea, palpitations, paroxysmal nocturnal dyspnea, rapid heart rate, shortness of breath, or lightheadedness, dizziness or wooziness, syncope or near syncope, TIA or emesis fugax, claudication  ROS:  Review of Systems - Negative except appetite has picked up, he started to eat a whole lot more than he used to.  He has gained about 10 pounds but has had issues with losing weight before.  He has notably improved chronic cough with much less wheezing.  No melena, hematochezia, hematuria or epistaxis.    Objective   Medications - Losartan  25 mg; - Toprol  25 mg daily - Plavix  75 mg daily - Atorvastatin  20 mg daily  Studies Reviewed: Ryan Cantrell   EKG Interpretation Date/Time:  Friday March 19 2024 09:39:25 EDT Ventricular Rate:  59 PR Interval:  138 QRS Duration:  162 QT Interval:  492 QTC Calculation: 487 R Axis:   96  Text Interpretation: Sinus bradycardia Incomplete right bundle branch block Rightward axis Non-specific intra-ventricular conduction block When compared with ECG of 05-Mar-2023 09:44, Incomplete right bundle branch block has replaced Right bundle branch block Confirmed by Ryan Cantrell ([id number]) on 03/19/2024 10:38:59 AM    No recent studies Lab Results  Component Value Date   CHOL 103 03/14/2023   HDL 39.80 03/14/2023   LDLCALC 51 03/14/2023   TRIG 64.0 03/14/2023   CHOLHDL 3 03/14/2023   Lab Results  Component Value Date   NA 135 03/14/2023   K 4.3 03/14/2023   CREATININE 0.91 03/14/2023   GFR 91.09 03/14/2023   GLUCOSE 95 03/14/2023    Risk Assessment/Calculations:            Physical Exam:   VS:  BP 134/66 (BP Location: Left Arm, Patient Position: Sitting, Cuff Size: Normal)   Pulse (!) 59   Resp 16   Ht 5' 9 (1.753 m)   Wt 155 lb 1.6 oz (70.4 kg)   SpO2 99%   BMI 22.90 kg/m    Wt Readings from Last 3 Encounters:  03/19/24 155 lb 1.6 oz (70.4 kg)  03/14/23 141 lb (64 kg)  03/05/23 143 lb 3.2 oz (65 kg)    GEN: Well nourished, well groomed in no acute distress; healthy appearing NECK: No JVD; No carotid bruits CARDIAC: Normal S1, S2; RRR, no murmurs, rubs, gallops RESPIRATORY:  Clear to auscultation without rales, wheezing or rhonchi ; nonlabored, good air movement. ABDOMEN: Soft, non-tender, non-distended EXTREMITIES:  No edema; No deformity     ASSESSMENT AND PLAN: .    Problem List Items Addressed This Visit       Cardiology Problems   CAD S/P percutaneous coronary angioplasty: Inf STEMI - 100% dRCA - PCI Promus P 2.5 mm x 12mm (2.6mm) (Chronic)   He  had GI issues with aspirin  and therefore we have maintained him on Plavix  monotherapy for his LAD stent prophylaxis. Continues on Plavix  with age-related bruising. - Continue Plavix  75 mg daily unless significant bleeding occurs. - Hold Plavix  for 5-7 days before procedures or if significant bleeding occurs.      Relevant Orders   EKG 12-Lead (Completed)   Coronary artery disease involving native heart without angina pectoris - Primary (Chronic)   Essentially single-vessel CAD with LAD PCI in the setting of MI. Currently maintained on Plavix  monotherapy, but okay to interrupt. He is on low-dose losartan  25 mg daily and Toprol  25 mg daily with adequate blood pressure control (initially elevated pressures today improved in follow-up). He is also on  a stable dose of atorvastatin  20 mg daily with well-controlled lipids.  Labs to be checked. - Continue current medications at current doses but low threshold to titrate up losartan  if his blood pressures increase.       Hyperlipidemia with target low density lipoprotein (LDL) cholesterol less than 55 mg/dL (Chronic)   Lipids last year were well within target range with LDL 51.  Should be due get labs checked pretty soon by his PCP for his upcoming visit in August.  Anticipates having lipids checked for that visit.  He seems to be tolerating his 20 mg atorvastatin  without any difficulty.  No myalgias, arthralgias or memory issues. - Continue current dose of atorvastatin  20 mg daily.      Primary hypertension (Chronic)   Previously bili borderline hypertension but now his initial pressures today were in the 150s.  Notably improved on follow-up  recording. Current losartan  dose (25 mg daily) may be insufficient.  Discussed caffeine and sugar as contributors to transient elevation. - Monitor blood pressure at home, especially the week before the next appointment. - Consider increasing losartan  to 50 mg if blood pressure remains elevated. -Continue  current dose of Toprol  25 mg daily, with resting heart rate in the 50s, would not be able to titrate up further. - Ensure adequate rest before blood pressure measurements at appointments.        Other   Pulmonary emphysema (HCC) (Chronic)           Follow-Up: Return in about 1 year (around 03/19/2025) for [employer].     Signed, Alm MICAEL Clay, MD, MS Alm Clay, M.D., M.S. Interventional Chartered certified accountant  Pager # [phone]

## 2024-03-19 NOTE — Assessment & Plan Note (Signed)
 Previously bili borderline hypertension but now his initial pressures today were in the 150s.  Notably improved on follow-up  recording. Current losartan  dose (25 mg daily) may be insufficient.  Discussed caffeine and sugar as contributors to transient elevation. - Monitor blood pressure at home, especially the week before the next appointment. - Consider increasing losartan  to 50 mg if blood pressure remains elevated. -Continue current dose of Toprol  25 mg daily, with resting heart rate in the 50s, would not be able to titrate up further. - Ensure adequate rest before blood pressure measurements at appointments.

## 2024-03-19 NOTE — Assessment & Plan Note (Signed)
 Essentially single-vessel CAD with LAD PCI in the setting of MI. Currently maintained on Plavix  monotherapy, but okay to interrupt. He is on low-dose losartan  25 mg daily and Toprol  25 mg daily with adequate blood pressure control (initially elevated pressures today improved in follow-up). He is also on a stable dose of atorvastatin  20 mg daily with well-controlled lipids.  Labs to be checked. - Continue current medications at current doses but low threshold to titrate up losartan  if his blood pressures increase.

## 2024-03-19 NOTE — Assessment & Plan Note (Signed)
 He had GI issues with aspirin  and therefore we have maintained him on Plavix  monotherapy for his LAD stent prophylaxis. Continues on Plavix  with age-related bruising. - Continue Plavix  75 mg daily unless significant bleeding occurs. - Hold Plavix  for 5-7 days before procedures or if significant bleeding occurs.

## 2024-04-09 ENCOUNTER — Encounter: Payer: Self-pay | Admitting: Internal Medicine

## 2024-04-09 ENCOUNTER — Ambulatory Visit: Admitting: Internal Medicine

## 2024-04-09 VITALS — BP 120/80 | HR 57 | Temp 97.5°F | Ht 68.5 in | Wt 154.0 lb

## 2024-04-09 DIAGNOSIS — Z0000 Encounter for general adult medical examination without abnormal findings: Secondary | ICD-10-CM

## 2024-04-09 DIAGNOSIS — Z125 Encounter for screening for malignant neoplasm of prostate: Secondary | ICD-10-CM | POA: Diagnosis not present

## 2024-04-09 DIAGNOSIS — I10 Essential (primary) hypertension: Secondary | ICD-10-CM | POA: Diagnosis not present

## 2024-04-09 DIAGNOSIS — I2510 Atherosclerotic heart disease of native coronary artery without angina pectoris: Secondary | ICD-10-CM

## 2024-04-09 LAB — COMPREHENSIVE METABOLIC PANEL WITH GFR
ALT: 11 U/L (ref 0–53)
AST: 15 U/L (ref 0–37)
Albumin: 4.6 g/dL (ref 3.5–5.2)
Alkaline Phosphatase: 51 U/L (ref 39–117)
BUN: 9 mg/dL (ref 6–23)
CO2: 31 meq/L (ref 19–32)
Calcium: 9.1 mg/dL (ref 8.4–10.5)
Chloride: 96 meq/L (ref 96–112)
Creatinine, Ser: 0.89 mg/dL (ref 0.40–1.50)
GFR: 91.92 mL/min (ref >60.00)
Glucose, Bld: 83 mg/dL (ref 70–99)
Potassium: 4 meq/L (ref 3.5–5.1)
Sodium: 134 meq/L — ABNORMAL LOW (ref 135–145)
Total Bilirubin: 2 mg/dL — ABNORMAL HIGH (ref 0.2–1.2)
Total Protein: 7 g/dL (ref 6.0–8.3)

## 2024-04-09 LAB — PSA: PSA: 7.4 ng/mL — ABNORMAL HIGH (ref 0.10–4.00)

## 2024-04-09 LAB — LIPID PANEL
Cholesterol: 111 mg/dL (ref 0–200)
HDL: 40.5 mg/dL (ref >39.00)
LDL Cholesterol: 54 mg/dL (ref 0–99)
NonHDL: 70.35
Total CHOL/HDL Ratio: 3
Triglycerides: 80 mg/dL (ref 0.0–149.0)
VLDL: 16 mg/dL (ref 0.0–40.0)

## 2024-04-09 LAB — CBC
HCT: 39.2 % (ref 39.0–52.0)
Hemoglobin: 13.4 g/dL (ref 13.0–17.0)
MCHC: 34.1 g/dL (ref 30.0–36.0)
MCV: 87.5 fl (ref 78.0–100.0)
Platelets: 321 K/uL (ref 150.0–400.0)
RBC: 4.48 Mil/uL (ref 4.22–5.81)
RDW: 12.9 % (ref 11.5–15.5)
WBC: 4.1 K/uL (ref 4.0–10.5)

## 2024-04-09 NOTE — Assessment & Plan Note (Signed)
 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

## 2024-04-09 NOTE — Progress Notes (Signed)
 Subjective:    Patient ID: Ryan Cantrell, male    DOB: [birth year], 62 y.o.   MRN: [id number]  HPI Here for physical With wife  Doing okay Still using e-cigarettes Some exercise--is active at work. Busy on farm (cows)  No heart trouble  Current Outpatient Medications on File Prior to Visit  Medication Sig Dispense Refill   acetaminophen  (TYLENOL ) 500 MG tablet Take 1,000 mg by mouth 2 (two) times daily as needed for mild pain or moderate pain.      atorvastatin  (LIPITOR ) 20 MG tablet TAKE 1 TABLET(20 MG) BY MOUTH AT BEDTIME 90 tablet 3   clopidogrel  (PLAVIX ) 75 MG tablet TAKE 1 TABLET(75 MG) BY MOUTH DAILY 90 tablet 3   losartan  (COZAAR ) 25 MG tablet Take 1 tablet (25 mg total) by mouth daily. 90 tablet 3   metoprolol  succinate (TOPROL -XL) 25 MG 24 hr tablet TAKE 1 TABLET(25 MG) BY MOUTH DAILY 90 tablet 3   triamcinolone  cream (KENALOG ) 0.1 % Apply topically 2 (two) times daily as needed. 45 g 1   No current facility-administered medications on file prior to visit.    Allergies  Allergen Reactions   Codeine Sulfate Nausea And Vomiting    Past Medical History:  Diagnosis Date   CAD S/P percutaneous coronary angioplasty: Inf STEMI - 100% dRCA - PCI Promus P 2.5 mm x 12mm (2.62mm) 05/29/2014   a. MI 05/29/14 dRCA PCI - Promus Premier DES 2.5 mm x 12 mm (2.8 mm); b. Echo: EF 55-60%, mild basal-inferior HK, normal DD   COPD (chronic obstructive pulmonary disease) (HCC)     PFTs 2013.. Mod severe COPD - Emphysema   ST elevation myocardial infarction (STEMI) of inferior wall, initial episode of care (HCC) 05/29/2014   Echocardiogram 05/30/2014: EF 55-60%. Mild HK of the basal inferior wall. Normal valves    Past Surgical History:  Procedure Laterality Date   Fracture left humerus  2009   no surgery. Torn rotator cuff also   INGUINAL HERNIA REPAIR     twice on right and once on left-- 1980's-90's   LEFT HEART CATH AND CORONARY ANGIOGRAPHY  05/29/2014   a. MI 05/29/14 dRCA 100%; D1  ~45%, Normal EF __> PCI to RCA   NM MYOVIEW  ([facility] HX)  03/03/2018    LOW RISK. Small size, partially reversible apical defect - suggestive of scar & small peri-infarct ischemia.  EF 55-60%.    PERCUTANEOUS CORONARY STENT INTERVENTION (PCI-S)     dRCA - Promus Premie DES 2.5 mm x 12 mm (2.8 mm)   Right shoulder surgery  1987   TRANSTHORACIC ECHOCARDIOGRAM  03/03/2018   EF 55-60%.  No R WMA, normal diastolic parameters.  Normal valves.    Family History  Problem Relation Age of Onset   Hypertension Mother    Diabetes Mother    Heart disease Mother        stent   Cancer Father        prostate   Hypertension Father    Gout Father    Heart disease Father        Stent   Lung cancer Brother    Colon cancer Neg Hx    Stomach cancer Neg Hx     Social History   Socioeconomic History   Marital status: Married    Spouse name: Not on file   Number of children: 1   Years of education: Not on file   Highest education level: 12th grade  Occupational History  Occupation: Control and instrumentation engineer  Tobacco Use   Smoking status: Former    Current packs/day: 0.00    Types: E-cigarettes, Cigarettes    Quit date: 05/29/2014    Years since quitting: 9.8    Passive exposure: Past (as a child)   Smokeless tobacco: Never   Tobacco comments:    1 carton a day of e-cigarette--no cigarettes since MI  Vaping Use   Vaping status: Every Day   Start date: 06/02/2014  Substance and Sexual Activity   Alcohol use: No    Alcohol/week: 0.0 standard drinks of alcohol   Drug use: No   Sexual activity: Yes  Other Topics Concern   Not on file  Social History Narrative   Married - 1 child.   Smokes 1/2 ppd.   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   Social Drivers of Corporate investment banker Strain: Low Risk  (04/07/2024)   Overall Financial Resource Strain (CARDIA)    Difficulty of Paying Living Expenses: Not hard at all  Food Insecurity: No Food Insecurity (04/07/2024)    Hunger Vital Sign    Worried About Running Out of Food in the Last Year: Never true    Ran Out of Food in the Last Year: Never true  Transportation Needs: No Transportation Needs (04/07/2024)   PRAPARE - Administrator, Civil Service (Medical): No    Lack of Transportation (Non-Medical): No  Physical Activity: Insufficiently Active (04/07/2024)   Exercise Vital Sign    Days of Exercise per Week: 2 days    Minutes of Exercise per Session: 30 min  Stress: No Stress Concern Present (04/07/2024)   [employer] of Occupational Health - Occupational Stress Questionnaire    Feeling of Stress: Not at all  Social Connections: Moderately Integrated (04/07/2024)   Social Connection and Isolation Panel    Frequency of Communication with Friends and Family: More than three times a week    Frequency of Social Gatherings with Friends and Family: More than three times a week    Attends Religious Services: 1 to 4 times per year    Active Member of [employer] or Organizations: No    Attends Engineer, structural: Not on file    Marital Status: Married  Catering manager Violence: Not on file   Review of Systems  Constitutional:  Negative for fatigue.       Has gained a few pounds Wears seat belt  HENT:  Negative for dental problem, hearing loss and tinnitus.        Now with dentures--full  Eyes:  Negative for visual disturbance.       No diplopia or unilateral vision loss  Respiratory:  Negative for cough, chest tightness and shortness of breath.   Cardiovascular:  Negative for chest pain, palpitations and leg swelling.  Gastrointestinal:  Negative for blood in stool and constipation.       No heartburn  Endocrine: Negative for polydipsia and polyuria.  Genitourinary:  Negative for difficulty urinating and urgency.       Doing better with sexual issues  Musculoskeletal:  Negative for arthralgias, back pain and joint swelling.  Skin:        Dry skin --feet and hands---uses cream No  suspicious lesions  Allergic/Immunologic: Negative for environmental allergies and immunocompromised state.  Neurological:  Negative for dizziness, syncope, light-headedness and headaches.  Hematological:  Negative for adenopathy. Bruises/bleeds easily.  Psychiatric/Behavioral:  Negative for dysphoric mood and sleep  disturbance. The patient is not nervous/anxious.        Objective:   Physical Exam Constitutional:      Appearance: Normal appearance.  HENT:     Mouth/Throat:     Pharynx: No oropharyngeal exudate or posterior oropharyngeal erythema.  Eyes:     Conjunctiva/sclera: Conjunctivae normal.     Pupils: Pupils are equal, round, and reactive to light.  Cardiovascular:     Rate and Rhythm: Normal rate and regular rhythm.     Pulses: Normal pulses.     Heart sounds: No murmur heard.    No gallop.  Pulmonary:     Effort: Pulmonary effort is normal.     Breath sounds: Normal breath sounds. No wheezing or rales.  Abdominal:     Palpations: Abdomen is soft.     Tenderness: There is no abdominal tenderness.  Musculoskeletal:     Cervical back: Neck supple.     Right lower leg: No edema.     Left lower leg: No edema.  Lymphadenopathy:     Cervical: No cervical adenopathy.  Skin:    Findings: No lesion or rash.  Neurological:     General: No focal deficit present.     Mental Status: He is alert and oriented to person, place, and time.  Psychiatric:        Mood and Affect: Mood normal.        Behavior: Behavior normal.            Assessment & Plan:

## 2024-04-09 NOTE — Assessment & Plan Note (Signed)
 No angina On atrovastatin 20, losartan  25, metoprolol  25 and plavix 

## 2024-04-09 NOTE — Assessment & Plan Note (Signed)
 BP Readings from Last 3 Encounters:  04/09/24 120/80  03/19/24 134/66  03/14/23 138/80   Controlled on heart meds

## 2024-04-10 ENCOUNTER — Ambulatory Visit: Payer: Self-pay | Admitting: Internal Medicine

## 2024-04-21 NOTE — Telephone Encounter (Signed)
 Copied from CRM #[id number]. Topic: General - Other >> Apr 20, 2024  4:47 PM Ryan Cantrell wrote: Reason for CRM: Patient requesting a call back from [facility], may also speak ot Holley Cedar patients wife.

## 2024-04-23 ENCOUNTER — Telehealth: Payer: Self-pay

## 2024-04-23 DIAGNOSIS — R972 Elevated prostate specific antigen [PSA]: Secondary | ICD-10-CM

## 2024-04-23 NOTE — Telephone Encounter (Signed)
 Copied from CRM #[id number]. Topic: General - Other >> Apr 23, 2024 12:41 PM Lavanda D wrote: Reason for CRM: Patient's wife Holley is returning Shannon's call regarding the referral/what they have decided. She would like a call back from [place] since she is a bit tied up taking care of the patient's mom. She would like to walk her through their plan.

## 2024-04-23 NOTE — Telephone Encounter (Signed)
 Tried to call Pam back. No answer. Left message. Advised her that if I am not available when she calls back, leave plan with person who takes the call or she can send a MyChart message.

## 2024-04-23 NOTE — Telephone Encounter (Signed)
 Spoke to pt's wife. He does want to move forward with a referral to a urologist. [place] is fine. Said it would need to be several weeks out.

## 2024-04-24 NOTE — Addendum Note (Signed)
 Addended by: JIMMY ADE I on: 04/24/2024 09:15 AM   Modules accepted: Orders

## 2024-05-21 ENCOUNTER — Ambulatory Visit: Admitting: Urology

## 2024-06-10 ENCOUNTER — Ambulatory Visit: Admitting: Urology

## 2024-06-11 ENCOUNTER — Other Ambulatory Visit: Payer: Self-pay | Admitting: Cardiology

## 2024-06-25 ENCOUNTER — Ambulatory Visit: Admitting: Urology

## 2024-07-14 DIAGNOSIS — R972 Elevated prostate specific antigen [PSA]: Secondary | ICD-10-CM | POA: Insufficient documentation

## 2024-07-14 NOTE — Progress Notes (Unsigned)
 07/16/24 11:02 AM   Ryan Cantrell 03/30/1961 [id number]   HPI: 63 y.o. male here for initial evaluation of elevated PSA            Component Ref Range & Units (hover) 3 mo ago 1 yr ago 2 yr ago 3 yr ago 5 yr ago 7 yr ago 9 yr ago  PSA 7.40 High  5.53 High  CM 5.16 High  CM 5.24 High  CM 3.60 CM 3.00 1.39     No prior GU conditions or GU surgeries Denies history of GH, prostatitis, nephrolithiasis, UTIs + Family history of prostate cancer in father (diagnosed age 75s treated with XRT), possible diagnosis in grandfather as well  Hx of severe COPD, CAD s/p stents, prior STEMI    PMH: Past Medical History:  Diagnosis Date   CAD S/P percutaneous coronary angioplasty: Inf STEMI - 100% dRCA - PCI Promus P 2.5 mm x 12mm (2.28mm) 05/29/2014   a. MI 05/29/14 dRCA PCI - Promus Premier DES 2.5 mm x 12 mm (2.8 mm); b. Echo: EF 55-60%, mild basal-inferior HK, normal DD   COPD (chronic obstructive pulmonary disease) (HCC)     PFTs 2013.. Mod severe COPD - Emphysema   ST elevation myocardial infarction (STEMI) of inferior wall, initial episode of care (HCC) 05/29/2014   Echocardiogram 05/30/2014: EF 55-60%. Mild HK of the basal inferior wall. Normal valves    Surgical History: Past Surgical History:  Procedure Laterality Date   Fracture left humerus  2009   no surgery. Torn rotator cuff also   INGUINAL HERNIA REPAIR     twice on right and once on left-- 1980's-90's   LEFT HEART CATH AND CORONARY ANGIOGRAPHY  05/29/2014   a. MI 05/29/14 dRCA 100%; D1 ~45%, Normal EF __> PCI to RCA   NM MYOVIEW  ([facility] HX)  03/03/2018    LOW RISK. Small size, partially reversible apical defect - suggestive of scar & small peri-infarct ischemia.  EF 55-60%.    PERCUTANEOUS CORONARY STENT INTERVENTION (PCI-S)     dRCA - Promus Premie DES 2.5 mm x 12 mm (2.8 mm)   Right shoulder surgery  1987   TRANSTHORACIC ECHOCARDIOGRAM  03/03/2018   EF 55-60%.  No R WMA, normal diastolic parameters.  Normal valves.     Family History: Family History  Problem Relation Age of Onset   Hypertension Mother    Diabetes Mother    Heart disease Mother        stent   Cancer Father        prostate   Hypertension Father    Gout Father    Heart disease Father        Stent   Lung cancer Brother    Colon cancer Neg Hx    Stomach cancer Neg Hx     Social History:  reports that he quit smoking about 10 years ago. His smoking use included e-cigarettes and cigarettes. He has been exposed to tobacco smoke. He has never used smokeless tobacco. He reports that he does not drink alcohol and does not use drugs.      Physical Exam: BP (!) 165/89   Pulse (!) 59   Ht 5' 10 (1.778 m)   Wt 157 lb (71.2 kg)   BMI 22.53 kg/m    Constitutional:  Alert and oriented, No acute distress. Cardiovascular: No clubbing, cyanosis, or edema. Respiratory: Normal respiratory effort, no increased work of breathing. GI: Nondistended GU: DRE-30-40 g gland generally smooth and symmetric  bilaterally, no discrete firm nodules Skin: No rashes, bruises or suspicious lesions. Neurologic: Grossly intact, no focal deficits, moving all 4 extremities. Psychiatric: Normal mood and affect.  Laboratory Data: Component Ref Range & Units (hover) 3 mo ago 1 yr ago 2 yr ago 3 yr ago 5 yr ago 7 yr ago 9 yr ago  PSA 7.40 High  5.53 High  CM 5.16 High  CM 5.24 High  CM 3.60 CM 3.00 1.39     Pertinent Imaging: N/A    Assessment & Plan:    Elevated PSA Assessment & Plan: PSA 7.4 (Aug 2025)  - steady uptrend from ~3 baseline (2022)  +Fhx of CaP in father, possible grandfather  We discussed the significance of an elevated prostate-specific antigen (PSA) level. PSA is a nonspecific marker and may be elevated due to both benign and malignant causes. Benign factors include benign prostatic hyperplasia (BPH), prostatitis or urinary tract infection, recent ejaculation, catheterization or instrumentation, and advancing age. Malignant causes  include prostate cancer of varying risk categories.  We reviewed that a single PSA value is less informative than following PSA trends over time, which can better reflect underlying pathology. Risk factors for prostate cancer include increasing age, family history of prostate cancer, African American race, and known germline mutations (e.g., BRCA2).  Next steps may include repeating PSA to confirm elevation, consideration of additional biomarkers or PSA derivatives (e.g., %free PSA, PSA density), and obtaining a multiparametric prostate MRI to assess for suspicious lesions. Based on PSA kinetics, risk profile, and MRI findings, a prostate biopsy may be recommended for definitive diagnosis.  - proceed with prostate MRI, followed by targeted vs systematic 12-core biopsy. Reviewed imaging and biopsy procedures today. Will communicate via Mychart once MRI results.   Orders: -     MR PROSTATE W WO CONTRAST; Future      Penne Skye, MD 07/16/2024  [facility] [address], [address] [place], [place] [address] [phone]

## 2024-07-14 NOTE — Assessment & Plan Note (Signed)
 PSA 7.4 (Aug 2025)  - steady uptrend from ~3 baseline (2022)  +Fhx of CaP in father, possible grandfather  We discussed the significance of an elevated prostate-specific antigen (PSA) level. PSA is a nonspecific marker and may be elevated due to both benign and malignant causes. Benign factors include benign prostatic hyperplasia (BPH), prostatitis or urinary tract infection, recent ejaculation, catheterization or instrumentation, and advancing age. Malignant causes include prostate cancer of varying risk categories.  We reviewed that a single PSA value is less informative than following PSA trends over time, which can better reflect underlying pathology. Risk factors for prostate cancer include increasing age, family history of prostate cancer, African American race, and known germline mutations (e.g., BRCA2).  Next steps may include repeating PSA to confirm elevation, consideration of additional biomarkers or PSA derivatives (e.g., %free PSA, PSA density), and obtaining a multiparametric prostate MRI to assess for suspicious lesions. Based on PSA kinetics, risk profile, and MRI findings, a prostate biopsy may be recommended for definitive diagnosis.  - proceed with prostate MRI, followed by targeted vs systematic 12-core biopsy. Reviewed imaging and biopsy procedures today. Will communicate via Mychart once MRI results.

## 2024-07-16 ENCOUNTER — Ambulatory Visit: Admitting: Urology

## 2024-07-16 VITALS — BP 165/89 | HR 59 | Ht 70.0 in | Wt 157.0 lb

## 2024-07-16 DIAGNOSIS — R972 Elevated prostate specific antigen [PSA]: Secondary | ICD-10-CM

## 2024-07-16 NOTE — Patient Instructions (Signed)
 Please contact Central Scheduling to set up your prostate MRI at [phone].  Prostate MRI Prep:  1- No ejaculation 48 hours prior to exam  2- No caffeine or carbonated beverages on day of the exam  3- Eat light diet evening prior and day of exam  4- Avoid eating 4 hours prior to exam  5- Fleets enema needs to be done 4 hours prior to exam -See below. Can be purchased at the drug store.

## 2024-07-19 ENCOUNTER — Telehealth: Payer: Self-pay

## 2024-07-19 NOTE — Telephone Encounter (Signed)
 I changed imaging center from [facility] to [facility] per [facility].

## 2024-08-20 ENCOUNTER — Other Ambulatory Visit

## 2024-09-07 ENCOUNTER — Encounter: Payer: Self-pay | Admitting: Urology

## 2024-09-10 ENCOUNTER — Other Ambulatory Visit

## 2024-09-17 ENCOUNTER — Ambulatory Visit
Admission: RE | Admit: 2024-09-17 | Discharge: 2024-09-17 | Disposition: A | Discharge status: Home / Self Care | Source: Ambulatory Visit | Attending: Urology | Admitting: Urology

## 2024-09-17 DIAGNOSIS — R972 Elevated prostate specific antigen [PSA]: Secondary | ICD-10-CM

## 2024-09-17 MED ORDER — GADOPICLENOL 0.5 MMOL/ML IV SOLN
7.5000 mL | Freq: Once | INTRAVENOUS | Status: AC | PRN
  Administered 2024-09-17: 7 mL via INTRAVENOUS

## 2024-09-21 ENCOUNTER — Ambulatory Visit: Payer: Self-pay | Admitting: Urology

## 2024-09-21 NOTE — Telephone Encounter (Signed)
 I called patient's phone number on his chart, patient's spouse Pam answered the phone. I informed her that I would schedule Mr.Schoffstall for the next available Fusion Biopsy and for his result follow up and If he decides to reschedule he could call back. If he had any additional questions he could call or send me a message through MyChart since his work hours are 8am-6pm. I provided our hours and our phone number in the MyChart message. Originally, I scheduled him on 11/18/24 and 11/26/24 but after getting off the phone I was able to move that appointment earlier to 10/21/24 and 11/03/24. I left patient a MyChart message informing them of the changes and to try to arrive 15 minutes early to both of those appointments. I also faxed over cardiac clearance to his Cardiologist Dr. Anner, patient is taking Plavix . I let her know that we would call back once we receive clearance of how long patient could be off his blood thinners prior to procedure.Patients wife had no further questions she stated she would talk to our husband and call back if needed.NO further questions or concerns.-Donterrius Santucci,CMA

## 2024-09-22 MED ORDER — DIAZEPAM 5 MG PO TABS: ORAL_TABLET | ORAL | 0 refills | Status: AC

## 2024-10-21 ENCOUNTER — Other Ambulatory Visit: Admitting: Urology

## 2024-11-03 ENCOUNTER — Ambulatory Visit: Admitting: Urology

## 2024-11-04 ENCOUNTER — Other Ambulatory Visit: Admitting: Urology

## 2024-11-18 ENCOUNTER — Other Ambulatory Visit: Admitting: Urology

## 2024-11-26 ENCOUNTER — Ambulatory Visit: Admitting: Urology

## 2025-04-21 ENCOUNTER — Encounter: Admitting: Family Medicine
